# Patient Record
Sex: Male | Born: 1960 | Race: White | Hispanic: No | Marital: Married | Smoking: Former smoker
Health system: Southern US, Community
[De-identification: ages and names within clinical notes are randomized; demographics above are authoritative.]

## PROBLEM LIST (undated history)

## (undated) DIAGNOSIS — J309 Allergic rhinitis, unspecified: Secondary | ICD-10-CM

## (undated) DIAGNOSIS — I499 Cardiac arrhythmia, unspecified: Secondary | ICD-10-CM

## (undated) DIAGNOSIS — G4733 Obstructive sleep apnea (adult) (pediatric): Secondary | ICD-10-CM

## (undated) DIAGNOSIS — E119 Type 2 diabetes mellitus without complications: Secondary | ICD-10-CM

## (undated) DIAGNOSIS — H9319 Tinnitus, unspecified ear: Secondary | ICD-10-CM

## (undated) DIAGNOSIS — F32A Depression, unspecified: Secondary | ICD-10-CM

## (undated) DIAGNOSIS — G4730 Sleep apnea, unspecified: Secondary | ICD-10-CM

## (undated) DIAGNOSIS — G8929 Other chronic pain: Secondary | ICD-10-CM

## (undated) DIAGNOSIS — I480 Paroxysmal atrial fibrillation: Secondary | ICD-10-CM

## (undated) DIAGNOSIS — F419 Anxiety disorder, unspecified: Secondary | ICD-10-CM

## (undated) DIAGNOSIS — R519 Headache, unspecified: Secondary | ICD-10-CM

## (undated) DIAGNOSIS — J189 Pneumonia, unspecified organism: Secondary | ICD-10-CM

## (undated) DIAGNOSIS — R0600 Dyspnea, unspecified: Secondary | ICD-10-CM

## (undated) DIAGNOSIS — H8110 Benign paroxysmal vertigo, unspecified ear: Secondary | ICD-10-CM

## (undated) DIAGNOSIS — E538 Deficiency of other specified B group vitamins: Secondary | ICD-10-CM

## (undated) DIAGNOSIS — I10 Essential (primary) hypertension: Secondary | ICD-10-CM

## (undated) DIAGNOSIS — M722 Plantar fascial fibromatosis: Secondary | ICD-10-CM

## (undated) DIAGNOSIS — R0609 Other forms of dyspnea: Secondary | ICD-10-CM

## (undated) DIAGNOSIS — R51 Headache: Secondary | ICD-10-CM

## (undated) HISTORY — DX: Obstructive sleep apnea (adult) (pediatric): G47.33

## (undated) HISTORY — DX: Other forms of dyspnea: R06.09

## (undated) HISTORY — DX: Dyspnea, unspecified: R06.00

## (undated) HISTORY — DX: Sleep apnea, unspecified: G47.30

## (undated) HISTORY — PX: SHOULDER SURGERY: SHX246

## (undated) HISTORY — DX: Headache: R51

## (undated) HISTORY — DX: Paroxysmal atrial fibrillation: I48.0

## (undated) HISTORY — DX: Type 2 diabetes mellitus without complications: E11.9

## (undated) HISTORY — DX: Headache, unspecified: R51.9

## (undated) HISTORY — DX: Other chronic pain: G89.29

## (undated) HISTORY — DX: Essential (primary) hypertension: I10

---

## 2003-11-16 ENCOUNTER — Inpatient Hospital Stay: Payer: Self-pay

## 2003-11-16 ENCOUNTER — Other Ambulatory Visit: Payer: Self-pay

## 2003-11-17 ENCOUNTER — Other Ambulatory Visit: Payer: Self-pay

## 2003-11-28 ENCOUNTER — Ambulatory Visit: Payer: Self-pay | Admitting: Otolaryngology

## 2004-07-03 ENCOUNTER — Other Ambulatory Visit: Payer: Self-pay

## 2004-07-03 ENCOUNTER — Emergency Department: Payer: Self-pay | Admitting: Emergency Medicine

## 2004-08-05 ENCOUNTER — Emergency Department: Payer: Self-pay | Admitting: Emergency Medicine

## 2004-08-11 ENCOUNTER — Ambulatory Visit: Payer: Self-pay | Admitting: Unknown Physician Specialty

## 2004-08-13 ENCOUNTER — Ambulatory Visit: Payer: Self-pay | Admitting: Unknown Physician Specialty

## 2004-08-14 ENCOUNTER — Ambulatory Visit: Payer: Self-pay | Admitting: Unknown Physician Specialty

## 2004-08-18 ENCOUNTER — Ambulatory Visit: Payer: Self-pay | Admitting: Gastroenterology

## 2004-08-19 ENCOUNTER — Ambulatory Visit: Payer: Self-pay | Admitting: Gastroenterology

## 2005-04-27 ENCOUNTER — Ambulatory Visit (HOSPITAL_BASED_OUTPATIENT_CLINIC_OR_DEPARTMENT_OTHER): Admission: RE | Admit: 2005-04-27 | Discharge: 2005-04-27 | Payer: Self-pay | Admitting: Orthopaedic Surgery

## 2005-09-12 ENCOUNTER — Emergency Department: Payer: Self-pay | Admitting: Emergency Medicine

## 2005-09-12 ENCOUNTER — Other Ambulatory Visit: Payer: Self-pay

## 2005-12-27 ENCOUNTER — Encounter: Payer: Self-pay | Admitting: Cardiology

## 2006-04-15 ENCOUNTER — Emergency Department: Payer: Self-pay | Admitting: Emergency Medicine

## 2007-05-07 ENCOUNTER — Other Ambulatory Visit: Payer: Self-pay

## 2007-05-07 ENCOUNTER — Emergency Department: Payer: Self-pay | Admitting: Emergency Medicine

## 2007-05-08 ENCOUNTER — Inpatient Hospital Stay: Payer: Self-pay | Admitting: Internal Medicine

## 2007-05-08 ENCOUNTER — Other Ambulatory Visit: Payer: Self-pay

## 2007-05-09 ENCOUNTER — Encounter: Payer: Self-pay | Admitting: Cardiology

## 2007-05-22 ENCOUNTER — Encounter: Payer: Self-pay | Admitting: Cardiology

## 2007-10-23 ENCOUNTER — Encounter: Payer: Self-pay | Admitting: Cardiology

## 2007-10-23 LAB — CONVERTED CEMR LAB
Albumin: 3.9 g/dL
BUN: 15 mg/dL
CO2: 27.6 meq/L
Chloride: 103 meq/L
Cholesterol: 195 mg/dL
Creatinine, Ser: 0.9 mg/dL
GFR calc non Af Amer: >60 mL/min
Glomerular Filtration Rate, Af Am: >60 mL/min/{1.73_m2}
LDL Cholesterol: 84.2 mg/dL
Potassium: 4.6 meq/L
Sodium: 136.8 meq/L
TSH: 2.005 microintl units/mL
Total Bilirubin: 1 mg/dL
Total Protein: 6.8 g/dL

## 2008-01-16 ENCOUNTER — Encounter: Payer: Self-pay | Admitting: Cardiology

## 2008-07-27 DIAGNOSIS — I10 Essential (primary) hypertension: Secondary | ICD-10-CM

## 2008-07-27 DIAGNOSIS — I480 Paroxysmal atrial fibrillation: Secondary | ICD-10-CM

## 2008-07-27 DIAGNOSIS — R002 Palpitations: Secondary | ICD-10-CM

## 2008-08-27 ENCOUNTER — Encounter: Payer: Self-pay | Admitting: Cardiology

## 2008-08-31 ENCOUNTER — Encounter: Payer: Self-pay | Admitting: Cardiology

## 2008-09-02 ENCOUNTER — Ambulatory Visit: Payer: Self-pay | Admitting: Cardiology

## 2008-11-18 ENCOUNTER — Ambulatory Visit: Payer: Self-pay | Admitting: Psychiatry

## 2009-09-12 ENCOUNTER — Ambulatory Visit: Payer: Self-pay | Admitting: Cardiovascular Disease

## 2009-12-09 ENCOUNTER — Encounter: Payer: Self-pay | Admitting: Internal Medicine

## 2009-12-09 ENCOUNTER — Ambulatory Visit: Payer: Self-pay | Admitting: Internal Medicine

## 2009-12-09 ENCOUNTER — Encounter: Payer: Self-pay | Admitting: Cardiovascular Disease

## 2009-12-15 ENCOUNTER — Encounter: Payer: Self-pay | Admitting: Internal Medicine

## 2009-12-22 ENCOUNTER — Encounter: Payer: Self-pay | Admitting: Internal Medicine

## 2009-12-22 ENCOUNTER — Encounter: Payer: Self-pay | Admitting: Cardiovascular Disease

## 2009-12-23 ENCOUNTER — Encounter: Payer: Self-pay | Admitting: Internal Medicine

## 2009-12-23 ENCOUNTER — Ambulatory Visit: Payer: Self-pay | Admitting: Specialist

## 2010-01-05 ENCOUNTER — Ambulatory Visit: Payer: Self-pay | Admitting: Internal Medicine

## 2010-01-05 DIAGNOSIS — R0609 Other forms of dyspnea: Secondary | ICD-10-CM

## 2010-01-05 DIAGNOSIS — R0989 Other specified symptoms and signs involving the circulatory and respiratory systems: Secondary | ICD-10-CM

## 2010-01-06 ENCOUNTER — Telehealth: Payer: Self-pay | Admitting: Internal Medicine

## 2010-01-06 ENCOUNTER — Telehealth (INDEPENDENT_AMBULATORY_CARE_PROVIDER_SITE_OTHER): Payer: Self-pay | Admitting: *Deleted

## 2010-01-08 ENCOUNTER — Ambulatory Visit: Payer: Self-pay | Admitting: Internal Medicine

## 2010-01-13 ENCOUNTER — Encounter: Payer: Self-pay | Admitting: Internal Medicine

## 2010-01-13 ENCOUNTER — Telehealth: Payer: Self-pay | Admitting: Internal Medicine

## 2010-01-14 ENCOUNTER — Encounter: Payer: Self-pay | Admitting: Internal Medicine

## 2010-01-16 ENCOUNTER — Encounter: Payer: Self-pay | Admitting: Cardiovascular Disease

## 2010-01-16 ENCOUNTER — Ambulatory Visit
Admission: RE | Admit: 2010-01-16 | Discharge: 2010-01-16 | Payer: Self-pay | Source: Home / Self Care | Attending: Cardiovascular Disease | Admitting: Cardiovascular Disease

## 2010-01-16 DIAGNOSIS — R079 Chest pain, unspecified: Secondary | ICD-10-CM

## 2010-01-16 DIAGNOSIS — R0602 Shortness of breath: Secondary | ICD-10-CM

## 2010-01-16 DIAGNOSIS — R072 Precordial pain: Secondary | ICD-10-CM

## 2010-01-20 ENCOUNTER — Telehealth: Payer: Self-pay | Admitting: Cardiovascular Disease

## 2010-01-20 ENCOUNTER — Encounter: Payer: Self-pay | Admitting: Cardiovascular Disease

## 2010-01-20 ENCOUNTER — Ambulatory Visit: Admit: 2010-01-20 | Payer: Self-pay | Admitting: Cardiovascular Disease

## 2010-01-20 ENCOUNTER — Telehealth (INDEPENDENT_AMBULATORY_CARE_PROVIDER_SITE_OTHER): Payer: Self-pay | Admitting: *Deleted

## 2010-01-20 LAB — CONVERTED CEMR LAB
CO2: 24 meq/L (ref 19–32)
Chloride: 105 meq/L (ref 96–112)
Creatinine, Ser: 0.94 mg/dL (ref 0.40–1.50)
Glucose, Bld: 91 mg/dL (ref 70–99)

## 2010-01-21 ENCOUNTER — Encounter (HOSPITAL_COMMUNITY)
Admission: RE | Admit: 2010-01-21 | Discharge: 2010-02-17 | Payer: Self-pay | Source: Home / Self Care | Attending: Cardiovascular Disease | Admitting: Cardiovascular Disease

## 2010-01-21 ENCOUNTER — Encounter: Payer: Self-pay | Admitting: *Deleted

## 2010-01-21 ENCOUNTER — Other Ambulatory Visit: Payer: Self-pay | Admitting: Cardiovascular Disease

## 2010-01-21 ENCOUNTER — Ambulatory Visit: Admission: RE | Admit: 2010-01-21 | Discharge: 2010-01-21 | Payer: Self-pay | Source: Home / Self Care

## 2010-01-21 ENCOUNTER — Encounter: Payer: Self-pay | Admitting: Cardiology

## 2010-01-21 ENCOUNTER — Telehealth (INDEPENDENT_AMBULATORY_CARE_PROVIDER_SITE_OTHER): Payer: Self-pay | Admitting: *Deleted

## 2010-01-21 ENCOUNTER — Ambulatory Visit (HOSPITAL_COMMUNITY)
Admission: RE | Admit: 2010-01-21 | Discharge: 2010-01-21 | Payer: Self-pay | Source: Home / Self Care | Attending: Cardiovascular Disease | Admitting: Cardiovascular Disease

## 2010-01-22 ENCOUNTER — Telehealth: Payer: Self-pay | Admitting: Internal Medicine

## 2010-01-22 ENCOUNTER — Encounter: Payer: Self-pay | Admitting: Cardiovascular Disease

## 2010-01-22 ENCOUNTER — Encounter: Payer: Self-pay | Admitting: Internal Medicine

## 2010-01-23 ENCOUNTER — Ambulatory Visit: Admit: 2010-01-23 | Payer: Self-pay | Admitting: Cardiovascular Disease

## 2010-01-28 ENCOUNTER — Encounter: Payer: Self-pay | Admitting: Cardiovascular Disease

## 2010-01-28 ENCOUNTER — Ambulatory Visit (HOSPITAL_COMMUNITY)
Admission: RE | Admit: 2010-01-28 | Discharge: 2010-01-28 | Payer: Self-pay | Source: Home / Self Care | Attending: Cardiovascular Disease | Admitting: Cardiovascular Disease

## 2010-02-04 ENCOUNTER — Telehealth: Payer: Self-pay | Admitting: Internal Medicine

## 2010-02-06 ENCOUNTER — Ambulatory Visit
Admission: RE | Admit: 2010-02-06 | Discharge: 2010-02-06 | Payer: Self-pay | Source: Home / Self Care | Attending: Internal Medicine | Admitting: Internal Medicine

## 2010-02-06 ENCOUNTER — Encounter: Payer: Self-pay | Admitting: Internal Medicine

## 2010-02-09 ENCOUNTER — Telehealth: Payer: Self-pay | Admitting: Internal Medicine

## 2010-02-09 ENCOUNTER — Encounter: Payer: Self-pay | Admitting: Cardiovascular Disease

## 2010-02-11 ENCOUNTER — Telehealth: Payer: Self-pay | Admitting: Internal Medicine

## 2010-02-12 ENCOUNTER — Telehealth: Payer: Self-pay | Admitting: Cardiovascular Disease

## 2010-02-13 ENCOUNTER — Encounter: Payer: Self-pay | Admitting: Cardiovascular Disease

## 2010-02-13 ENCOUNTER — Encounter: Payer: Self-pay | Admitting: Internal Medicine

## 2010-02-13 ENCOUNTER — Ambulatory Visit
Admission: RE | Admit: 2010-02-13 | Discharge: 2010-02-13 | Payer: Self-pay | Source: Home / Self Care | Attending: Internal Medicine | Admitting: Internal Medicine

## 2010-02-13 ENCOUNTER — Ambulatory Visit
Admission: RE | Admit: 2010-02-13 | Discharge: 2010-02-13 | Payer: Self-pay | Source: Home / Self Care | Attending: Cardiology | Admitting: Cardiology

## 2010-02-16 ENCOUNTER — Encounter: Payer: Self-pay | Admitting: Cardiovascular Disease

## 2010-02-16 LAB — CONVERTED CEMR LAB
BUN: 14 mg/dL (ref 6–23)
Basophils Absolute: 0 10*3/uL (ref 0.0–0.1)
Basophils Relative: 1 % (ref 0–1)
CO2: 22 meq/L (ref 19–32)
Chloride: 104 meq/L (ref 96–112)
Creatinine, Ser: 1 mg/dL (ref 0.40–1.50)
Eosinophils Absolute: 0.2 10*3/uL (ref 0.0–0.7)
INR: 0.95 (ref <1.50)
MCHC: 35.6 g/dL (ref 30.0–36.0)
MCV: 95.7 fL (ref 78.0–100.0)
Monocytes Absolute: 0.2 10*3/uL (ref 0.1–1.0)
Monocytes Relative: 5 % (ref 3–12)
Neutrophils Relative %: 65 % (ref 43–77)
RBC: 4.64 M/uL (ref 4.22–5.81)
RDW: 12.7 % (ref 11.5–15.5)

## 2010-02-17 ENCOUNTER — Ambulatory Visit (HOSPITAL_BASED_OUTPATIENT_CLINIC_OR_DEPARTMENT_OTHER)
Admission: RE | Admit: 2010-02-17 | Discharge: 2010-02-17 | Disposition: A | Discharge status: Home / Self Care | Payer: BC Managed Care – PPO | Attending: Internal Medicine | Admitting: Internal Medicine

## 2010-02-17 ENCOUNTER — Encounter: Payer: Self-pay | Admitting: Internal Medicine

## 2010-02-17 ENCOUNTER — Telehealth (INDEPENDENT_AMBULATORY_CARE_PROVIDER_SITE_OTHER): Payer: Self-pay | Admitting: *Deleted

## 2010-02-17 DIAGNOSIS — E669 Obesity, unspecified: Secondary | ICD-10-CM | POA: Insufficient documentation

## 2010-02-17 DIAGNOSIS — R0789 Other chest pain: Secondary | ICD-10-CM | POA: Insufficient documentation

## 2010-02-17 DIAGNOSIS — Z79899 Other long term (current) drug therapy: Secondary | ICD-10-CM | POA: Insufficient documentation

## 2010-02-17 DIAGNOSIS — R0609 Other forms of dyspnea: Secondary | ICD-10-CM | POA: Insufficient documentation

## 2010-02-17 DIAGNOSIS — I10 Essential (primary) hypertension: Secondary | ICD-10-CM | POA: Insufficient documentation

## 2010-02-17 DIAGNOSIS — R0989 Other specified symptoms and signs involving the circulatory and respiratory systems: Secondary | ICD-10-CM | POA: Insufficient documentation

## 2010-02-17 LAB — POCT I-STAT 3, VENOUS BLOOD GAS (G3P V)
Acid-base deficit: 4 mmol/L — ABNORMAL HIGH (ref 0.0–2.0)
Bicarbonate: 23.1 mEq/L (ref 20.0–24.0)
Bicarbonate: 23.7 mEq/L (ref 20.0–24.0)
O2 Saturation: 67 %
TCO2: 25 mmol/L (ref 0–100)
pCO2, Ven: 45.1 mmHg (ref 45.0–50.0)
pCO2, Ven: 47.9 mmHg (ref 45.0–50.0)
pH, Ven: 7.292 (ref 7.250–7.300)
pH, Ven: 7.328 — ABNORMAL HIGH (ref 7.250–7.300)
pO2, Ven: 39 mmHg (ref 30.0–45.0)
pO2, Ven: 40 mmHg (ref 30.0–45.0)

## 2010-02-17 LAB — POCT I-STAT 3, ART BLOOD GAS (G3+)
Acid-base deficit: 1 mmol/L (ref 0.0–2.0)
Bicarbonate: 25.2 mEq/L — ABNORMAL HIGH (ref 20.0–24.0)
O2 Saturation: 97 %
TCO2: 27 mmol/L (ref 0–100)
pCO2 arterial: 46.5 mmHg — ABNORMAL HIGH (ref 35.0–45.0)
pH, Arterial: 7.341 — ABNORMAL LOW (ref 7.350–7.450)
pO2, Arterial: 92 mmHg (ref 80.0–100.0)

## 2010-02-17 NOTE — Assessment & Plan Note (Signed)
Summary: EC6/AMD   Visit Type:  Follow-up Referring Provider:  Lenise Herald MD Primary Provider:  Marlou Starks MD  CC:  Denies chest pain or shortness of breath..  History of Present Illness:  50year-old married white male with a history of isolated atrial fibrillation when he was 50 years of age. his last episode was at age 35. history of hypertension.   overall, he is been feeling well. He denies any recent episodes of atrial fibrillation. He may have had a very brief episode one year ago lasting for several seconds. He is active, has been trying to work on his weight though his weight has been increasing. He sleep well with no significant sleep apnea symptoms. He reports a previous sleep study which was normal. He has had nasal septal surgery. He has back pain and cannot sleep on his stomach.  His last echocardiogram was in April 2009. He had an EF of 55%, mild left atrial enlargement, otherwise unremarkable. A Holter monitor did not show any atrial fib.  EKG shows normal sinus rhythm with rate of 60 beats per minute, no significant ST or T wave changes.  Current Medications (verified): 1)  Flomax 0.4 Mg Caps (Tamsulosin Hcl) .Marland Kitchen.. 1 By Mouth Once Daily 2)  Bystolic 5 Mg Tabs (Nebivolol Hcl) .Marland Kitchen.. 1 By Mouth Once Daily 3)  Aspirin Ec 325 Mg Tbec (Aspirin) .... Take One Tablet By Mouth Daily 4)  Venlafaxine Hcl 75 Mg Tabs (Venlafaxine Hcl) .... One Tablet Once Daily  Allergies (verified): No Known Drug Allergies  Past History:  Past Medical History: Last updated: 08/31/2008 Atrial Fibrillation with rapid ventricular response Hypertension Intermittent tachypalpitations Borderline hyperlipidemia  Past Surgical History: Last updated: 09/02/2008 2X RIGHT SHOULDER  Family History: Last updated: 08/31/2008 No family history of coronary artery disease  Social History: Last updated: 09/02/2008 He is married. He has 4 dgts. He drinks wine on and off twice a week. Full  Time Tobacco Use - Former.  Regular Exercise - no  Risk Factors: Alcohol Use: 1/2 WEEKEND (09/02/2008) Caffeine Use: 4/5 CUPS OF COFFEE (09/02/2008) Exercise: no (09/02/2008)  Risk Factors: Smoking Status: quit (09/02/2008) Packs/Day: 1.5 (09/02/2008)  Review of Systems       The patient complains of weight gain.  The patient denies fever, weight loss, vision loss, decreased hearing, hoarseness, chest pain, syncope, dyspnea on exertion, peripheral edema, prolonged cough, abdominal pain, incontinence, muscle weakness, depression, and enlarged lymph nodes.    Vital Signs:  Patient profile:   50 year old male Height:      72 inches Weight:      266 pounds BMI:     36.21 Pulse rate:   54 / minute BP sitting:   128 / 76  (right arm) Cuff size:   large  Vitals Entered By: Bishop Dublin, CMA (September 12, 2009 10:36 AM)  Physical Exam  General:  Well developed, well nourished, in no acute distress. Head:  normocephalic and atraumatic Neck:  Neck supple, no JVD. No masses, thyromegaly or abnormal cervical nodes. Lungs:  Clear bilaterally to auscultation and percussion. Heart:  Non-displaced PMI, chest non-tender; regular rate and rhythm, S1, S2 without murmurs, rubs or gallops. Carotid upstroke normal, no bruit. Pedals normal pulses. No edema, no varicosities. Abdomen:   abdomen soft and non-tender without masses Msk:  Back normal, normal gait. Muscle strength and tone normal. Pulses:  pulses normal in all 4 extremities Extremities:  No clubbing or cyanosis. Neurologic:  Alert and oriented x 3. Skin:  Intact  without lesions or rashes. Psych:  Normal affect.   Impression & Recommendations:  Problem # 1:  ATRIAL FIBRILLATION (ICD-427.31) no recent episodes of atrial fibrillation. We will continue him on his current medication regimen.  His updated medication list for this problem includes:    Bystolic 5 Mg Tabs (Nebivolol hcl) .Marland Kitchen... 1 by mouth once daily    Aspirin Ec 325 Mg  Tbec (Aspirin) .Marland Kitchen... Take one tablet by mouth daily  Orders: EKG w/ Interpretation (93000)  Problem # 2:  HYPERTENSION, BENIGN (ICD-401.1) Blood pressure is reasonably well controlled. No changes to his medicines.  His updated medication list for this problem includes:    Bystolic 5 Mg Tabs (Nebivolol hcl) .Marland Kitchen... 1 by mouth once daily    Aspirin Ec 325 Mg Tbec (Aspirin) .Marland Kitchen... Take one tablet by mouth daily  Patient Instructions: 1)  Your physician recommends that you continue on your current medications as directed. Please refer to the Current Medication list given to you today. 2)  Your physician wants you to follow-up in:   1 year You will receive a reminder letter in the mail two months in advance. If you don't receive a letter, please call our office to schedule the follow-up appointment.

## 2010-02-18 ENCOUNTER — Telehealth: Payer: Self-pay | Admitting: Internal Medicine

## 2010-02-19 NOTE — Progress Notes (Signed)
Summary: Rx needed?   Phone Note Call from Patient   Summary of Call: Pt c/o sinus pressure/pain and green drainage, non productive cough. Mucinex DM has not helped. No fever, body aches or chills. Patient is requesting advisement.  Initial call taken by: Lamar Sprinkles, CMA,  February 04, 2010 10:52 AM  Follow-up for Phone Call        1. Sudafed 30 mg three times a day, continue mucinex DM 2.Z-pak as directed.  3. Did he make it to the cardio-pulmonary stress test last week - no report yet in EMR Follow-up by: Jacques Navy MD,  February 04, 2010 2:13 PM  Additional Follow-up for Phone Call Additional follow up Details #1::        Pt informed, Yes he made it to test and they advised results would not be avail until the 20th at the earliest. Pt had test at the hospital.  Additional Follow-up by: Lamar Sprinkles, CMA,  February 04, 2010 3:47 PM    New/Updated Medications: ZITHROMAX Z-PAK 250 MG TABS (AZITHROMYCIN) as directed Prescriptions: ZITHROMAX Z-PAK 250 MG TABS (AZITHROMYCIN) as directed  #1 x 0   Entered by:   Lamar Sprinkles, CMA   Authorized by:   Jacques Navy MD   Signed by:   Lamar Sprinkles, CMA on 02/04/2010   Method used:   Electronically to        [facility] (retail)       [address], [address]       [place], [place]  [address]       Ph: [phone]       Fax: [phone]   RxID:   [id number]

## 2010-02-19 NOTE — Assessment & Plan Note (Signed)
Summary: new pt/bcbs/#/lb   Vital Signs:  Patient profile:   50 year old male Height:      72 inches Weight:      278.50 pounds BMI:     37.91 O2 Sat:      96 % on Room air Temp:     98.2 degrees F oral Pulse rate:   53 / minute BP sitting:   122 / 74  (left arm) Cuff size:   large  Vitals Entered By: Rock Nephew CMA (January 05, 2010 12:56 PM)  O2 Flow:  Room air CC: New to establish// pt c/o SOB and chest pressure since 11/2009 Is Patient Diabetic? No Pain Assessment Patient in pain? no       Does patient need assistance? Functional Status Self care Ambulation Normal   Primary Care Crecencio Kwiatek:  Marlou Starks MD  CC:  New to establish// pt c/o SOB and chest pressure since 11/2009.  History of Present Illness: Patient is seen acutely for persisten illness.   He has been sick since November 11th. He was seen at Urgent Care - diagnosed with flu, treated with a Z-pak and prednisone. Didn't get better. Went back to Urgent Care - question of pneumonia LLB based on chest x-ray and was treated with Levaquin and more prednisone. Didn't get better. Went to Dr. Mayo Ao, pulmonology at [facility]. CT scan revealed a nodule at the left main main bronchus which lead to FOB - revealing inflammation but no malignancy with negative BAL. No additional treatment prescribed as of time of last contact approximately  December 10th.   He continues to feel weak, SOB, DOE. He gets a feeling of pressure on/in his chest. Still with a non-productive cough. Doesn't feel right. Had lab work, cannot recall what, but it was all negative. He did have an elevated white count initially but on repeat CBC whie count was normal. He does not recall any other positive labs. He has not had PFTs. He did return to Dr. sparks, his PCP, who started him empirically on symbicort.   He has h/o atrial fibrillation with hospitalization x 3: onset at age 83. Sees Dr. Mariah Milling, [facility]. He had been seen by Dr.  Jenne Campus and the Dr. Daleen Squibb. He has never been deemed a candidate for coumadin or other anticoagulant except for aspirin. He has not been diagnosed with CAD and has had no testing for this.   Current Medications (verified): 1)  Flomax 0.4 Mg Caps (Tamsulosin Hcl) .Marland Kitchen.. 1 By Mouth Once Daily 2)  Bystolic 5 Mg Tabs (Nebivolol Hcl) .Marland Kitchen.. 1 By Mouth Once Daily 3)  Aspirin Ec 325 Mg Tbec (Aspirin) .... Take One Tablet By Mouth Daily 4)  Venlafaxine Hcl 75 Mg Tabs (Venlafaxine Hcl) .... One Tablet Once Daily  Allergies (verified): No Known Drug Allergies  Past History:  Past Medical History: Last updated: 08/31/2008 Atrial Fibrillation with rapid ventricular response Hypertension Intermittent tachypalpitations Borderline hyperlipidemia  Past Surgical History: Last updated: 09/02/2008 2X RIGHT SHOULDER  Family History: Father - deceased @ 66: Cancer with mets; emphysema Mother - 48: good health, on no meds No family history of coronary artery disease; colon or prostate cancer; no DM  Social History: [employer] Married - '82 - 17 yrs/divorced; married '06.  He has 4 daughters. Work: truck Hospital doctor - long haul.   He drinks wine on and off twice a week. Tobacco Use - Former.  Regular Exercise - no  Review of Systems       The patient complains  of weight gain, hoarseness, chest pain, syncope, dyspnea on exertion, prolonged cough, muscle weakness, and depression.  The patient denies anorexia, fever, weight loss, decreased hearing, peripheral edema, headaches, hemoptysis, abdominal pain, melena, hematochezia, severe indigestion/heartburn, suspicious skin lesions, transient blindness, difficulty walking, unusual weight change, and angioedema.         chest pressure - like suffocating. Has had near syncope with position change. Dry, non-productive cough. Depression well controlled with Effexor.  Physical Exam  General:  WNWD muscular white male in no acute distress Head:  normocephalic and  atraumatic.   Eyes:  pupils equal and pupils round.   Ears:  External ear exam shows no significant lesions or deformities.  Otoscopic examination reveals clear canals, tympanic membranes are intact bilaterally without bulging, retraction, inflammation or discharge. Hearing is grossly normal bilaterally. Mouth:  Oral mucosa and oropharynx without lesions or exudates.  Teeth in good repair. Neck:  supple, full ROM, no thyromegaly, and no carotid bruits.   Chest Wall:  No deformities, masses, tenderness or gynecomastia noted. Increased AP diameter Lungs:  normal respiratory effort, no intercostal retractions, no accessory muscle use, normal breath sounds, no fremitus, no crackles, and no wheezes.   Heart:  normal rate, regular rhythm, no murmur, and no HJR.   Abdomen:  soft, non-tender, and normal bowel sounds.   Msk:  normal ROM, no joint tenderness, no joint swelling, and no joint instability.   Pulses:  radial 2+ bilaterally Neurologic:  alert & oriented X3, cranial nerves II-XII intact, strength normal in all extremities, and gait normal.   Skin:  turgor normal, color normal, no rashes, and no ulcerations.   Cervical Nodes:  no anterior cervical adenopathy and no posterior cervical adenopathy.   Psych:  Oriented X3 and memory intact for recent and remote.     Impression & Recommendations:  Problem # 1:  DYSPNEA ON EXERTION (ICD-786.09) Complex history that all began with a probable infectious episode. Work-up to date has not detrmined a cause for his persistent symptoms of DOE, decreased energy and stamina and persistent cough. Exam is unremarkable. Considerations include chronic infectious disease, i.e. EBV, mycoplasma, other viral agents, zooinosis; sarcoidosis or other auto-immune disease; cardiac disease.  Plan - obtain and review all labs and imaging reports pertaining to this illness           pre and post bronchodilator pulmonary function studies.            additional studies/labs  following review           treatment for cyclical or irritative cough: promethazine/codeine cough syrup; benzonatate 100mg  three times a day; nexium 40mg  qAM           Consider for cardiac stress testing  Follow- up visit after PFTs            His updated medication list for this problem includes:    Bystolic 5 Mg Tabs (Nebivolol hcl) .Marland Kitchen... 1 by mouth once daily  Orders: Pulmonary Referral (Pulmonary)  Problem # 2:  HYPERTENSION, BENIGN (ICD-401.1)  His updated medication list for this problem includes:    Bystolic 5 Mg Tabs (Nebivolol hcl) .Marland Kitchen... 1 by mouth once daily  BP today: 122/74 Prior BP: 128/76 (09/12/2009)  Good control  Complete Medication List: 1)  Flomax 0.4 Mg Caps (Tamsulosin hcl) .Marland Kitchen.. 1 by mouth once daily 2)  Bystolic 5 Mg Tabs (Nebivolol hcl) .Marland Kitchen.. 1 by mouth once daily 3)  Aspirin Ec 325 Mg Tbec (Aspirin) .... Take one tablet by  mouth daily 4)  Venlafaxine Hcl 75 Mg Tabs (Venlafaxine hcl) .... One tablet once daily 5)  Promethazine-codeine 6.25-10 Mg/17ml Syrp (Promethazine-codeine) .Marland Kitchen.. 1 tsp q 6 for cough 6)  Benzonatate 100 Mg Caps (Benzonatate) .Marland Kitchen.. 1 by mouth three times a day for cough 7)  Nexium 40 Mg Cpdr (Esomeprazole magnesium) .Marland Kitchen.. 1 by mouth q am for cough/reflux  Patient Instructions: 1)  pulmonary - no definite diagnosis despite work up todate. Plan - full pulmonary function studies (at [facility]); will review all labs and imaging reports. For cough: promethazine with codein 1 tsp (or less) every 6 hours as needed; benzonatate perles 100mg  three times a day; nexium 40mg  by mouth before breakfast daily. Symbicort is optional at this time: if it doesn't help don't use it.  2)  Cardiac - normal exam. Symptoms do raise the concern for possible coronary disease, i.e. heaviness in the chest, decreased exercise tolerance and shortness of breath with exertin. Plan - will notify Dr. Mariah Milling of my concerns and defer to him.  Prescriptions: NEXIUM 40 MG CPDR  (ESOMEPRAZOLE MAGNESIUM) 1 by mouth q Am for cough/reflux  #30 x 5   Entered and Authorized by:   Jacques Navy MD   Signed by:   Jacques Navy MD on 01/05/2010   Method used:   Print then Give to Patient   RxID:   [id number] BENZONATATE 100 MG CAPS (BENZONATATE) 1 by mouth three times a day for cough  #30 x 5   Entered and Authorized by:   Jacques Navy MD   Signed by:   Jacques Navy MD on 01/05/2010   Method used:   Print then Give to Patient   RxID:   [id number] PROMETHAZINE-CODEINE 6.25-10 MG/5ML SYRP (PROMETHAZINE-CODEINE) 1 tsp q 6 for cough  #8 oz x 1   Entered and Authorized by:   Jacques Navy MD   Signed by:   Jacques Navy MD on 01/05/2010   Method used:   Print then Give to Patient   RxID:   [id number]    Orders Added: 1)  Pulmonary Referral [Pulmonary] 2)  New Patient Level IV [01027]

## 2010-02-19 NOTE — Letter (Signed)
Summary: Work Writer at [employer]. [address]   [place], [place] [address]   Phone: [phone]  Fax: [phone]     February 09, 2010    Collin Holt   The above named patient will need to be excused from work for medical reasons until further testing is completed for 2 weeks from today 02/09/10.  Please take this into consideration when reviewing the time away from work. Feel free to give our office a call for any further questions or concerns at [phone].      Sincerely yours,        Dossie Arbour, MD

## 2010-02-19 NOTE — Letter (Signed)
Summary: Work Writer at [employer]. [address]   [place], [place] [address]   Phone: [phone]  Fax: [phone]     January 22, 2010    Brodyn Hornak    Please exuse patient from work for medical reasons from now through February 10, 2010. If you have any questions please call me at [phone].    Sincerely yours,    Dr. Dossie Arbour [facility]

## 2010-02-19 NOTE — Progress Notes (Signed)
Summary: ALT MED NEEDED?   Phone Note Call from Patient   Summary of Call: Pt was given zpak w/no change in symptoms (see phone note from 1/18).  Initial call taken by: Lamar Sprinkles, CMA,  February 11, 2010 1:35 PM  Follow-up for Phone Call        Augmentin 875mg  two times a day x 7. If no improvement will need ov.  Follow-up by: Jacques Navy MD,  February 11, 2010 5:30 PM  Additional Follow-up for Phone Call Additional follow up Details #1::        Pt informed  Additional Follow-up by: Lamar Sprinkles, CMA,  February 11, 2010 6:32 PM    New/Updated Medications: AUGMENTIN 875-125 MG TABS (AMOXICILLIN-POT CLAVULANATE) 1 two times a day x 7 Prescriptions: AUGMENTIN 875-125 MG TABS (AMOXICILLIN-POT CLAVULANATE) 1 two times a day x 7  #14 x 0   Entered by:   Lamar Sprinkles, CMA   Authorized by:   Jacques Navy MD   Signed by:   Lamar Sprinkles, CMA on 02/11/2010   Method used:   Electronically to        [facility] (retail)       [address], [address]       [place], [place]  [address]       Ph: [phone]       Fax: [phone]   RxID:   [id number]

## 2010-02-19 NOTE — Assessment & Plan Note (Signed)
Summary: F/U post CPX   Visit Type:  Follow-up Referring Provider:  Lenise Herald MD Primary Provider:  Dr. Judithann Holt  CC:  c/o SOB and cold. Denies chest pain and palpitations..  History of Present Illness: Collin Holt is a 50 year old married white male with a history of isolated atrial fibrillation when he was 50 years of age. his last episode was at age 49,  hypertension, obesity, referred by Collin Holt for further evaluation of dyspnea.  Dyspnea started in November. Initially it was felt that this was due to a viral URI and bronchitis. He was treated with antibiotics. Since then his shortness of breath has persisted and become worse. His wife reports that he is unable to do anything, including walking short distances without significant shortness of breath. He is at significant workup including bronchoscopy, pulmonary function tests, CT scan of the chest on other things which have been unrevealing.   His last echocardiogram was in April 2009. He had an EF of 55%, mild left atrial enlargement, otherwise unremarkable. A Holter monitor did not show any atrial fib.  Saw Collin Holt a couple weeks ago and arranged for CPX test.   Had CPX 01/28/10: pVO2 26.4 (101% predicted)  - corrects to 39.3 for ideal weight, RER 1.12 Slope 28.2 Ve/MVV 84% O2-pulse 100%  Resting spirometry normal.   Since that time had 4 days where he felt better but then got another URI and now feels SOB again. Also having tachypalpitations again 1-2 times per day. Last 15-20 minutes. Had sleep study 5 years ago which was normal. None since. Wife says he snores and stops breathing. No CP. No edema.  Drinking several beers and several glasses of wine per night.    Problems Prior to Update: 1)  Chest Pain-unspecified  (ICD-786.50) 2)  Chest Pain-precordial  (ICD-786.51) 3)  Shortness of Breath  (ICD-786.05) 4)  Dyspnea On Exertion  (ICD-786.09) 5)  Atrial Fibrillation  (ICD-427.31) 6)  Hypertension, Benign  (ICD-401.1) 7)   Palpitations  (ICD-785.1)  Medications Prior to Update: 1)  Flomax 0.4 Mg Caps (Tamsulosin Hcl) .Marland Kitchen.. 1 By Mouth Once Daily 2)  Aspirin Ec 325 Mg Tbec (Aspirin) .... Take One Tablet By Mouth Daily 3)  Venlafaxine Hcl 75 Mg Tabs (Venlafaxine Hcl) .... One Tablet Once Daily 4)  Promethazine-Codeine 6.25-10 Mg/32ml Syrp (Promethazine-Codeine) .Marland Kitchen.. 1 Tsp Q 6 For Cough 5)  Furosemide 20 Mg Tabs (Furosemide) .... Take One Tablet By Mouth Two Times A Day. 6)  Zithromax Z-Pak 250 Mg Tabs (Azithromycin) .... As Directed  Current Medications (verified): 1)  Flomax 0.4 Mg Caps (Tamsulosin Hcl) .Marland Kitchen.. 1 By Mouth Once Daily 2)  Aspirin Ec 325 Mg Tbec (Aspirin) .... Take One Tablet By Mouth Daily 3)  Venlafaxine Hcl 75 Mg Tabs (Venlafaxine Hcl) .... One Tablet Once Daily 4)  Zithromax Z-Pak 250 Mg Tabs (Azithromycin) .... As Directed 5)  Sudafed 30 Mg Tabs (Pseudoephedrine Hcl) .Marland Kitchen.. 1 Tablet Three Times A Day 6)  Mucinex Dm 30-600 Mg Xr12h-Tab (Dextromethorphan-Guaifenesin) .Marland Kitchen.. 1 Tablet Daily  Allergies (verified): No Known Drug Allergies  Past History:  Past Medical History: Last updated: 08/31/2008 Atrial Fibrillation with rapid ventricular response Hypertension Intermittent tachypalpitations Borderline hyperlipidemia  Past Surgical History: Last updated: 09/02/2008 2X RIGHT SHOULDER  Family History: Last updated: 01/20/2010 Father - deceased @ 58: Cancer with mets; emphysema Mother - 1922: good health, on no meds No family history of coronary artery disease; colon or prostate cancer; no DM  Social History: Last updated: 2010-01-20 HSG Married - '  82 - 17 yrs/divorced; married '06.  He has 4 daughters. Work: truck Hospital doctor - long haul.   He drinks wine on and off twice a week. Tobacco Use - Former.  Regular Exercise - no  Risk Factors: Alcohol Use: 1/2 WEEKEND (09/02/2008) Caffeine Use: 4/5 CUPS OF COFFEE (09/02/2008) Exercise: no (09/02/2008)  Risk Factors: Smoking Status: quit  (09/02/2008) Packs/Day: 1.5 (09/02/2008)  Family History: Reviewed history from 01/05/2010 and no changes required. Father - deceased @ 65: Cancer with mets; emphysema Mother - 74: good health, on no meds No family history of coronary artery disease; colon or prostate cancer; no DM  Social History: Reviewed history from 01/05/2010 and no changes required. HSG Married - '82 - 17 yrs/divorced; married '06.  He has 4 daughters. Work: truck Hospital doctor - long haul.   He drinks wine on and off twice a week. Tobacco Use - Former.  Regular Exercise - no  Review of Systems       As per HPI and past medical history; otherwise all systems negative.   Vital Signs:  Patient profile:   50 year old male Height:      72 inches Weight:      279.25 pounds BMI:     38.01 Pulse rate:   99 / minute BP sitting:   122 / 72  (left arm) Cuff size:   large  Vitals Entered By: Collin Holt CMA (February 06, 2010 2:07 PM)  Physical Exam  General:  overweight muscular white male in no acute distress HEENT: normal Neck: thick  supple. no JVD. Carotids 2+ bilat; no bruits. No lymphadenopathy or thryomegaly appreciated. Cor: PMI nondisplaced. Regular rate & rhythm. No rubs, gallops, murmur. Lungs: clear Abdomen: obese soft, nontender, nondistended.  Extremities: no cyanosis, clubbing, rash, edema Neuro: alert & orientedx3, cranial nerves grossly intact. moves all 4 extremities w/o difficulty. affect pleasant    Impression & Recommendations:  Problem # 1:  DYSPNEA ON EXERTION (ICD-786.09) Had a long talk with him and his wife about results of CPX. This shows excellent functional capacity with no cardiac limitation. His limiting factors appear to be his weight and related ventilatory limitation. I also suspect he has signifcant sleep apnea. Suggested serious weight loss program and sleep study. We did discuss possibility of heart cath but I told him I did not feel there was any indication for this  currently based on his CPX.   Problem # 2:  PALPITATIONS (ICD-785.1) Will place monitor. Discussed the role sleep apnea and ETOH intake can have with atrial arrhythmias. Will arrange sleep study and also counseled him on need to limit ETOH intake.   Other Orders: EKG w/ Interpretation (93000)  Patient Instructions: 1)  Your physician recommends that you schedule a follow-up appointment in: 1 month 2)  Your physician recommends that you continue on your current medications as directed. Please refer to the Current Medication list given to you today. 3)  Your physician has recommended that you wear an event monitor for 2 weeks.  We will order this and it will be sent to your home. Event monitors are medical devices that record the heart's electrical activity. Doctors most often use these monitors to diagnose arrhythmias. Arrhythmias are problems with the speed or rhythm of the heartbeat. The monitor is a small, portable device. You can wear one while you do your normal daily activities. This is usually used to diagnose what is causing palpitations/syncope (passing out). 4)  Your physician has recommended that you have a  sleep study.  This test records several body functions during sleep, including:  brain activity, eye movement, oxygen and carbon dioxide blood levels, heart rate and rhythm, breathing rate and rhythm, the flow of air through your mouth and nose, snoring, body muscle movements, and chest and belly movement. 5)  Your consult with Collin Holt for sleep study is scheduled for Wednesday, March 04, 2010 @ 3:30, please arrive at 3:15.

## 2010-02-19 NOTE — Progress Notes (Signed)
Summary: Out wk note  Phone Note Call from Patient   Summary of Call: Needs wk note from today from to next Tuesday. OK?  Initial call taken by: Lamar Sprinkles, CMA,  January 13, 2010 2:40 PM  Follow-up for Phone Call        OK for work note as requested. Follow-up by: Jacques Navy MD,  January 13, 2010 6:02 PM

## 2010-02-19 NOTE — Progress Notes (Signed)
Summary: SOB  Phone Note Call from Patient Call back at Home Phone [phone]   Caller: Spouse-[name] Call For: Nurse Summary of Call: Would like to schedule procedures sooner. This weekend Mr.Grieger didn't feel any better, per Eunice Blase he has felt worse. Also pt would like to have a note for work because he doesn't feel like he can go until all tests are scheduled. Initial call taken by: Lysbeth Galas CMA,  February 09, 2010 8:26 AM  Follow-up for Phone Call        Spoke to pt's wife, pt has had incr SOB over weekend that occurs at rest at random throughout the day. Pt is scheduled for consult with Dr. Craige Cotta for sleep consult for 03/04/10 which is the first available appt. Pt is concerned to wait this long due to current symptoms. Pt has 2 week event monitor ordered that should arrive to pt's home in 48 hours. Notified pt he could come in today for nurse visit for EKG and review of symptoms. Pt states he is not currently SOB but will call if this occurs and he may come in. Do you recommend anything different in the meantime? Pt stated you mentioned possibility of heart cath, he is open for anything at this time. Please advise. Follow-up by: Lanny Hurst RN,  February 09, 2010 9:29 AM  Additional Follow-up for Phone Call Additional follow up Details #1::        If symptoms worse we can consider heart cath though my suspicion is that this is not cardiac in nature. However, he does have risk factors and if he would like to proceed with cath I do not think that this is unreasonable appraoch. Dolores Patty, MD, [facility]  February 10, 2010 4:59 PM      Appended Document: SOB Spoke to pt's wife this am. Pt is having palpitations after minimal exertion. Notified her of above recommendation. Also told pt he could come for nurse visit for EKG. Event monitor to arrive this week. Pt's wife will talk to him and call us back to let us know what he wants to do.

## 2010-02-19 NOTE — Assessment & Plan Note (Signed)
Summary: SOB, CHEST TIGHTNESS/SAB   Visit Type:  Establish Care Primary Provider:  Dr. Judithann Sheen  CC:  c/o SOB, chest tightness, and disoriented at times and A. Fib for the first time yesterday.Collin Holt  History of Present Illness:  50year-old married white male with a history of isolated atrial fibrillation when he was 50 years of age. his last episode was at age 50,  hypertension, obesity, presents with worsening SOB and chest tightness.  He indicates that his shortness of breath started in November. Initially it was felt that this was due to a viral URI and bronchitis. He was treated with antibiotics. Since then his shortness of breath has persisted and become worse. His wife reports that he is unable to do anything, including walking short distances without significant shortness of breath. He is at significant workup including bronchoscopy, pulmonary function tests, CT scan of the chest on other things which have been unrevealing.  His wife also reports that in addition to the shortness of breath, he has had episodes of nausea, dizziness, and appears pale when these episodes present. He denies any significant recent arrhythmia apart from a possible episode lasting 3-4 seconds.  When asked if he could perform on a treadmill, both he and his wife said that he would have significant difficulty walking any distance because of his shortness of breath.   His last echocardiogram was in April 2009. He had an EF of 55%, mild left atrial enlargement, otherwise unremarkable. A Holter monitor did not show any atrial fib.  EKG shows normal sinus rhythm with rate of 60 beats per minute, no significant ST or T wave changes.  Current Medications (verified): 1)  Flomax 0.4 Mg Caps (Tamsulosin Hcl) .Collin Holt.. 1 By Mouth Once Daily 2)  Bystolic 5 Mg Tabs (Nebivolol Hcl) .Collin Holt.. 1 By Mouth Once Daily 3)  Aspirin Ec 325 Mg Tbec (Aspirin) .... Take One Tablet By Mouth Daily 4)  Venlafaxine Hcl 75 Mg Tabs (Venlafaxine Hcl) ....  One Tablet Once Daily 5)  Promethazine-Codeine 6.25-10 Mg/48ml Syrp (Promethazine-Codeine) .Collin Holt.. 1 Tsp Q 6 For Cough  Allergies (verified): No Known Drug Allergies  Past History:  Past Medical History: Last updated: 08/31/2008 Atrial Fibrillation with rapid ventricular response Hypertension Intermittent tachypalpitations Borderline hyperlipidemia  Past Surgical History: Last updated: 09/02/2008 2X RIGHT SHOULDER  Family History: Last updated: 01/19/2010 Father - deceased @ 41: Cancer with mets; emphysema Mother - 1922: good health, on no meds No family history of coronary artery disease; colon or prostate cancer; no DM  Social History: Last updated: 01/19/2010 HSG Married - '82 - 17 yrs/divorced; married '06.  He has 4 daughters. Work: truck Hospital doctor - long haul.   He drinks wine on and off twice a week. Tobacco Use - Former.  Regular Exercise - no  Risk Factors: Alcohol Use: 1/2 WEEKEND (09/02/2008) Caffeine Use: 4/5 CUPS OF COFFEE (09/02/2008) Exercise: no (09/02/2008)  Risk Factors: Smoking Status: quit (09/02/2008) Packs/Day: 1.5 (09/02/2008)  Review of Systems       The patient complains of weight gain, chest pain, and dyspnea on exertion.  The patient denies fever, weight loss, vision loss, decreased hearing, hoarseness, syncope, peripheral edema, prolonged cough, abdominal pain, incontinence, muscle weakness, depression, and enlarged lymph nodes.    Vital Signs:  Patient profile:   50 year old male Height:      72 inches Weight:      278.50 pounds BMI:     37.91 Pulse rate:   57 / minute BP sitting:  132 / 72  (left arm) Cuff size:   large  Vitals Entered By: Lysbeth Galas CMA (January 16, 2010 3:34 PM)  Physical Exam  General:  WNWD muscular white male in no acute distress Head:  normocephalic and atraumatic Neck:  Neck supple, no JVD. No masses, thyromegaly or abnormal cervical nodes. Lungs:  Clear bilaterally to auscultation and  percussion. Heart:  Non-displaced PMI, chest non-tender; regular rate and rhythm, S1, S2 without murmurs, rubs or gallops. Carotid upstroke normal, no bruit.  Pedals normal pulses. No edema, no varicosities. Abdomen:  Bowel sounds positive; abdomen soft and non-tender without masses Msk:  Back normal, normal gait. Muscle strength and tone normal. Pulses:  pulses normal in all 4 extremities Extremities:  No clubbing or cyanosis. Neurologic:  Alert and oriented x 3. Skin:  Intact without lesions or rashes. Psych:  Normal affect.   Impression & Recommendations:  Problem # 1:  CHEST PAIN-UNSPECIFIED (ICD-786.50) etiology of his chest pain is uncertain. Chest pain and shortness of breath seemed to present together and have become much worse over the past month. He is unable to treadmill because of his significant shortness of breath. We have placed an order for an echocardiogram and likely scan Myoview ( given that he is unable to treadmill). Pulmonary workup has been unrevealing. We will hold his beta blocker for now to make sure that this is not contributing to his symptoms. Also check a basic metabolic panel and BNP. We have given him a prescription for Lasix 20 mg b.i.d. p.r.n. if his symptoms do get worse. If BNP is normal this would likely be of minimal benefit.  The following medications were removed from the medication list:    Bystolic 5 Mg Tabs (Nebivolol hcl) .Collin Holt... 1 by mouth once daily His updated medication list for this problem includes:    Aspirin Ec 325 Mg Tbec (Aspirin) .Collin Holt... Take one tablet by mouth daily  Problem # 2:  HYPERTENSION, BENIGN (ICD-401.1) we will monitor his blood pressure off the beta blocker.  The following medications were removed from the medication list:    Bystolic 5 Mg Tabs (Nebivolol hcl) .Collin Holt... 1 by mouth once daily His updated medication list for this problem includes:    Aspirin Ec 325 Mg Tbec (Aspirin) .Collin Holt... Take one tablet by mouth daily     Furosemide 20 Mg Tabs (Furosemide) .Collin Holt... Take one tablet by mouth two times a day.  Orders: T-Basic Metabolic Panel [id number])  Problem # 3:  ATRIAL FIBRILLATION (ICD-427.31) No clear signal that he is having any underlying arrhythmia. A Holter monitor could be done if his echocardiogram and stress test are normal.  The following medications were removed from the medication list:    Bystolic 5 Mg Tabs (Nebivolol hcl) .Collin Holt... 1 by mouth once daily His updated medication list for this problem includes:    Aspirin Ec 325 Mg Tbec (Aspirin) .Collin Holt... Take one tablet by mouth daily  Other Orders: T-BNP  (B Natriuretic Peptide) ([id number]) Echocardiogram (Echo) Nuclear Stress Test (Nuc Stress Test)  Patient Instructions: 1)  Your physician recommends that you schedule a follow-up appointment in: 2 weeks 2)  Your physician has recommended you make the following change in your medication: STOP Bystolic. START Lasix 20mg  two times a day. 3)  Your physician has requested that you have an echocardiogram.  Echocardiography is a painless test that uses sound waves to create images of your heart. It provides your doctor with information about the size and shape of your heart  and how well your heart's chambers and valves are working.  This procedure takes approximately one hour. There are no restrictions for this procedure. 4)  Your physician has requested that you have a Lexiscan myoview.   Prescriptions: FUROSEMIDE 20 MG TABS (FUROSEMIDE) Take one tablet by mouth two times a day.  #60 x 6   Entered by:   Lanny Hurst RN   Authorized by:   Dossie Arbour MD   Signed by:   Lanny Hurst RN on 01/16/2010   Method used:   Electronically to        [facility]  [address] (retail)       [address], [address]       [place], [place]  [address]       Ph: [phone]       Fax: [phone]   RxID:   [id number]   Appended Document: SOB, CHEST TIGHTNESS/SAB BMP and BNP are  normal.

## 2010-02-19 NOTE — Progress Notes (Signed)
Summary: RESULTS - no improvement  Phone Note Call from Patient   Caller: Wife, Collin Holt [phone] Summary of Call: Pt's wife called. Pt req results of tests, he has make no improvement since last office visit. Continues to have symptoms, some increase in coughing - yesterday walking around in [employer] pt c/o nausea, sob, fatigue & chest tightness. He has trouble exhaling, feels flu-like w/no fever and chest tightness. SOB is w/minor activities -putting socks on etc... Initial call taken by: Lamar Sprinkles, CMA,  January 13, 2010 11:33 AM  Follow-up for Phone Call        called and spoke with patient and his wife. Reviewed the PFTs - normal. Patient continues to have SOB and Chest tightness with no pulmonary explanation.  Plan - advised that he call Dr. Mariah Milling, [facility] for a soon appointment. If there is any difficulty it getting an appointment they should let me know.  Follow-up by: Jacques Navy MD,  January 13, 2010 12:58 PM

## 2010-02-19 NOTE — Progress Notes (Signed)
Summary: Nuclear pre procedure  Phone Note Outgoing Call Call back at Home Phone [phone]   Call placed by: Rea [employer], CMA,  January 20, 2010 2:53 PM Call placed to: Patient Summary of Call: Reviewed information on Myoview Information Sheet (see scanned document for further details).  Arline Asp spoke with patient.      Nuclear Med Background Indications for Stress Test: Evaluation for Ischemia   History: Echo  History Comments: '09 Echo:EF=55%; h/o afib.  Symptoms: Chest Pain, Chest Tightness, Dizziness, DOE, Nausea, SOB    Nuclear Pre-Procedure Cardiac Risk Factors: History of Smoking, Hypertension, Lipids, Obesity Height (in): 72

## 2010-02-19 NOTE — Miscellaneous (Signed)
Summary: Orders Update pft charges  Clinical Lists Changes  Orders: Added new Service order of Carbon Monoxide diffusing w/capacity (94720) - Signed Added new Service order of Lung Volumes (94240) - Signed Added new Service order of Spirometry (Pre & Post) (94060) - Signed 

## 2010-02-19 NOTE — Progress Notes (Signed)
  Phone Note Other Incoming   Request: Send information Summary of Call: Records received from [facility]. 17 pages forwarded to Dr. Debby Bud for review.

## 2010-02-19 NOTE — Miscellaneous (Signed)
Summary: CPX  Clinical Lists Changes  Orders: Added new Referral order of CPX Test at [facility] (CPX Test) - Signed 

## 2010-02-19 NOTE — Letter (Signed)
Summary: [facility]   Imported By: Sherian Rein 02/02/2010 09:40:06  _____________________________________________________________________  External Attachment:    Type:   Image     Comment:   External Document

## 2010-02-19 NOTE — Progress Notes (Signed)
Summary: SOB  Phone Note Call from Patient Call back at Home Phone [phone]   Caller: Eunice Blase Call For: Nurse/Gollan Summary of Call: pt's SOB is worse, feeling weak and tired after long night of sleep. Also wants results from blood work. Initial call taken by: Lysbeth Galas CMA,  January 20, 2010 10:32 AM  Follow-up for Phone Call        Spoke to pt's wife, SOB incr since weekend, incr in sleep and fatigue. Pt feels he cannot walk down hallway without getting SOB. Pt's echo and myoview scheduled tomorrow in [place]. Advised wife to take pt to ER if he feels he cannot wait for procedure tomorrow and to notify [facility] that he is scheduled for these tests. She will keep [place] updated of any changes.  Follow-up by: Lanny Hurst RN,  January 20, 2010 4:03 PM

## 2010-02-19 NOTE — Assessment & Plan Note (Signed)
Summary: Cardiology Nuclear Testing  Nuclear Med Background Indications for Stress Test: Evaluation for Ischemia   History: Echo  History Comments: '09 Echo:EF=55%; h/o afib.  Symptoms: Chest Pain, Chest Tightness, Dizziness, DOE, Nausea, SOB    Nuclear Pre-Procedure Cardiac Risk Factors: History of Smoking, Hypertension, Lipids, Obesity Caffeine/Decaff Intake: None NPO After: 7:30 PM IV 0.9% NS with Angio Cath: 22g     IV Site: R Hand IV Started by: Bonnita Levan, RN Chest Size (in) 52     Height (in): 72 Weight (lb): 278 BMI: 37.84  Nuclear Med Study 1 or 2 day study:  1 day     Stress Test Type:  Treadmill/Lexiscan Reading MD:  Cassell Clement, MD     Referring MD:  T.Gollan Resting Radionuclide:  Technetium 40m Tetrofosmin     Resting Radionuclide Dose:  11 mCi  Stress Radionuclide:  Technetium 74m Tetrofosmin     Stress Radionuclide Dose:  33 mCi   Stress Protocol  Max Systolic BP: 126 mm Hg Lexiscan: 0.4 mg   Stress Test Technologist:  Milana Na, EMT-P     Nuclear Technologist:  Domenic Polite, CNMT  Rest Procedure  Myocardial perfusion imaging was performed at rest 45 minutes following the intravenous administration of Technetium 60m Tetrofosmin.  Stress Procedure  The patient received IV Lexiscan 0.4 mg over 15-seconds with concurrent low level exercise and then Technetium 54m Tetrofosmin was injected at 30-seconds while the patient continued walking one more minute.  There were no significant changes with Lexiscan.  Quantitative spect images were obtained after a 45 minute delay.  QPS Raw Data Images:  Normal; no motion artifact; normal heart/lung ratio. Stress Images:  Normal homogeneous uptake in all areas of the myocardium. Rest Images:  Normal homogeneous uptake in all areas of the myocardium. Subtraction (SDS):  No evidence of ischemia. Transient Ischemic Dilatation:  .95  (Normal <1.22)  Lung/Heart Ratio:  .32  (Normal <0.45)  Quantitative  Gated Spect Images QGS EDV:  149 ml QGS ESV:  62 ml QGS EF:  58 % QGS cine images:  No wall motion abnormalities.  Findings Normal nuclear study      Overall Impression  Exercise Capacity: Lexiscan with no exercise. BP Response: Normal blood pressure response. Clinical Symptoms: Dyspnea and dizziness ECG Impression: No significant ST segment change suggestive of ischemia. Overall Impression: Normal stress nuclear study.

## 2010-02-19 NOTE — Miscellaneous (Signed)
Summary: CPX  Clinical Lists Changes  Orders: Added new Referral order of CPX Test at [facility] (CPX Test) - Signed

## 2010-02-19 NOTE — Progress Notes (Signed)
Summary: No improvement in symptoms  Phone Note Call from Patient Call back at Home Phone [phone]   Caller: Spouse Summary of Call: pt feels horrible, no better. C/O SOB, weakness, fatigue. Pulmonary and cardiac eval both came back ok. Pt is at whits end getting ready to go to ER. Would like advisment from Dr. Debby Bud before he goes to ER. Initial call taken by: Lanier Prude, [facility]),  January 22, 2010 5:17 PM  Follow-up for Phone Call        called patient: he is scheduled for cardio-pulmonary stress next wednsday.  He is advised to contact Ami for any acute problems so we can provide help and avoid the ED.  Follow-up by: Jacques Navy MD,  January 22, 2010 7:04 PM

## 2010-02-19 NOTE — Letter (Signed)
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     Sincerely,    Dr. Nicholes Mango

## 2010-02-19 NOTE — Letter (Signed)
Summary: Cardiac Catheterization Instructions- [facility]  [facility] at [address]   [place], [place] [address]   Phone: [phone]  Fax: [phone]     02/13/2010 MRN: [id number]  Collin Holt [address] [place], [place]  [address]  Dear [name],   You are scheduled for a Cardiac Catheterization on 02/17/10 with Dr. Gala Romney.  Please arrive to the 1st floor of the Heart and Vascular Center at [facility] at 8:30 am on the day of your procedure. Please do not arrive before 6:30 a.m. Call the Heart and Vascular Center at [phone] if you are unable to make your appointmnet. The Code to get into the parking garage under the building is 0300. Take the elevators to the 1st floor. You must have someone to drive you home. Someone must be with you for the first 24 hours after you arrive home. Please wear clothes that are easy to get on and off and wear slip-on shoes. Do not eat or drink after midnight except water with your medications that morning. Bring all your medications and current insurance cards with you.  _X__ Make sure you take your aspirin.  _X__ You may take ALL of your medications with water that morning. ________________________________________________________________________________________________________________________________   The usual length of stay after your procedure is 2 to 3 hours. This can vary.  If you have any questions, please call the office at [phone]   Lanny Hurst RN

## 2010-02-19 NOTE — Letter (Signed)
Summary: [facility]   Imported By: Sherian Rein 02/02/2010 09:39:05  _____________________________________________________________________  External Attachment:    Type:   Image     Comment:   External Document

## 2010-02-19 NOTE — Progress Notes (Signed)
  Phone Note Outgoing Call   Call placed by: Ami Bullins CMA,  January 06, 2010 9:35 AM Call placed to: [facility] Details for Reason: Waiting on call back Summary of Call: I am awaiting a call from [place] [facility] [place] clinic for results from cell count on a Bronchoalveolar Lavage.  Initial call taken by: Ami Bullins CMA,  January 06, 2010 9:35 AM  Follow-up for Phone Call        Lennox Laity to see if any results have been recieved waiting on call back Follow-up by: Ami Bullins CMA,  January 06, 2010 2:41 PM  Additional Follow-up for Phone Call Additional follow up Details #1::        results recieved, pt is set up for PFT on thursday with [place] [facility] Additional Follow-up by: Ami Bullins CMA,  January 06, 2010 4:31 PM

## 2010-02-19 NOTE — Letter (Signed)
Summary: Out of Work  [facility]  [address] [place], [place] [address]   Phone: [phone]  Fax: [phone]    January 14, 2010   Employee:  Lucious Elwood    To Whom It May Concern:   For Medical reasons, please excuse the above named employee from work for the following dates:  Start:   01/12/2010  End:   01/16/2010  If you need additional information, please feel free to contact our office.         Sincerely,    Lanier Prude, [facility]) for Illene Regulus, MD

## 2010-02-19 NOTE — Letter (Signed)
Summary: Work Excuse  Work Excuse   Imported By: Harlon Flor 01/27/2010 13:47:17  _____________________________________________________________________  External Attachment:    Type:   Image     Comment:   External Document

## 2010-02-19 NOTE — Progress Notes (Signed)
  Phone Note Call from Patient   Caller: Spouse Eunice Blase) Summary of Call: Please call regarding stress test/Echo pt. is very short of breath and is very exhausted.  Would like to know if this is heart or should he go see his PCP.  [phone]  Please call ASAP! Initial call taken by: Bishop Dublin, CMA,  January 21, 2010 4:12 PM  Follow-up for Phone Call        Notified patient's wife Eunice Blase) of Nuclear stress test & Echo results; looked normal per Dr. Mariah Milling.  The patient had a Chest CT that was normal.  Told need to come in office this week for an ETT. Told we will contact in the a.m. with what day and time to come for ETT. Follow-up by: Bishop Dublin, CMA,  January 21, 2010 5:35 PM  Additional Follow-up for Phone Call Additional follow up Details #1::        Notified patient's wife need to have an ETT @ [facility] on June 24, 2010 @ 8:00 should arrive at 7:30 a.m. Additional Follow-up by: Bishop Dublin, CMA,  January 22, 2010 8:49 AM

## 2010-02-19 NOTE — Progress Notes (Signed)
Summary: SOB/Palpitations  Phone Note Other Incoming   Caller: Pt's wife Summary of Call: Pt's wife called yesterday 02/11/10 stating pt c/o palpitations after minimal exertion. Pt c/o incr SOB when this occurs. Pt has had multiple diagnostic tests done with normal results, and at last ov with Dr. Gala Romney, possible heart catheterization was discussed even though symptoms do not seem cardiac in nature per Dr. Gala Romney. Pt is now requesting he have cath done. Dr. Gala Romney aware of pt request and we ordered cath for next tuesday 1.31.12. Pt has been doing work around the house today and has palpitations and SOB and sits down, advised pt to not do anything to exert himself until we do cath. Pt ok with this. Pt also has event monitor ordered, I beleive pt's wife told [employer] when they called to put a hold on this because we had scheduled cath, this was incorrect so I will call [employer] today and make sure it is to arrive asap. Also spoke to Dr. Mariah Milling with pt's symptoms. Advised pt's wife to obtain a digital BP/HR machine to check pt's HR when these episodes occur to make sure HR is not irregular, educated pt's wife how to know if it could be a fib. Instruted her to record #'s so we can have something to work with if needed. And if it looks like symptoms may be caused by arrythmia we can probably hold off on cath. Pt is ok with this and will get BP machine.  Initial call taken by: Lanny Hurst RN,  February 12, 2010 12:42 PM  Follow-up for Phone Call        02/12/10 Today spoke to pt's wife she reported BP/HR of averaging 150-165/95-105 and HR's 60-70s. These #'s are at rest. Pt is not doing anything strenuous today and will rest as instructed. He wanted [place] to know that when his breathing episodes occur, he feels on exhalation like he has "puffs of air" he is trying to get out. Dr. Mariah Milling aware of BP/HR results. Notified pt that we have performed almost every test that could be done for his symptoms except  for the event monitor and heart cath (which are scheduled) and that his BP is of no concern for causing his symptoms. His HR is stable and not significant of an electrical problem.  We have done 2 stress tests (Lexiscan and CPX), EKG, CT scan and PFT's in the past all of which were normal. Pt understands these tests were normal, just does not understand why he is feeling this way. Instructed pt that if he went to ER they are probably going to do tests that we have already done, however, if he is so uncomfortable that he cannot breathe adequately and feels he needs to go to ER to let them know what tests we have done and that he is scheduled for cath next tuesday. Pt is ok with this. Notified pt's wife if cath is normal as well as event monitor, the next step would be to refer back to Dr. Debby Bud that this is not evident of being cardiac related and some sort of anxiety treatment may be evaluated. Pt and his wife are ok with this and will keep [place] updated on status.  Follow-up by: Lanny Hurst RN,  February 12, 2010 12:42 PM

## 2010-02-25 NOTE — Progress Notes (Signed)
Summary: Cardiology Phone Note - Headache  Phone Note Call from Patient   Caller: Patient Summary of Call: Pt's wife called to say that her husband had a severe HA post cath. Per pt's wife, had cath around 9:30. Once home around 1:30 developed mild headache that got more severe by 3pm. At 3pm he took 3 200mg  ibuprofen tablets and felt like he needed to go to bed, so he did. Since then, his headache has gotten much better. The patient feels that being in a quiet dark room has helped and feels that it is easing off. He has had no other symptoms including visual changes, weakness, numbness, or balance problems. Instructed pt to continue to take ibuprofen 3 tablets every 6 hours through tomorrow as needed, and notify us if there is any worsening of the situation. Also instructed pt to proceed to ER if he does develop aforementioned neurologic deficits. He expressed understanding and gratitude. Initial call taken by: Ronie Spies PA-C

## 2010-02-25 NOTE — Letter (Signed)
Summary: At Home BP Readings  At Home BP Readings   Imported By: Harlon Flor 02/16/2010 08:16:40  _____________________________________________________________________  External Attachment:    Type:   Image     Comment:   External Document  Appended Document: At Home BP Readings BP numbers are mildly elevated. See what cath shows on tuesday. Continue to monitor BP. If it continues to be elevated, we can add medication for BP  Appended Document: At Home BP Readings Pt scheduled for cath today, will speak to pt tomorrow.  Appended Document: At Home BP Readings Spoke to pt's wife today, the cath was normal, pt feeling much better (phone note 02/18/10). Notified of above recommendations, pt will call with any changes.

## 2010-02-25 NOTE — Assessment & Plan Note (Signed)
Summary: ekg/sab  Nurse Visit  CC: SOB Comments Pt in for labs today for pre-cardiac cath, scheduled 02/17/10 with Dr. Gala Romney, and EKG done to r/o any dysrhythmia's to be causing pt's symptoms. He has had multiple diagnostic tests in the recent past that are normal. Pt also has event monitor to arrive to home soon. Pt does not take any cardiac medications. EKG today shows NSR.    Allergies: No Known Drug Allergies

## 2010-02-25 NOTE — Progress Notes (Signed)
Summary: FYI  Phone Note Outgoing Call   Caller: Spouse Summary of Call: Called pt's  Call placed by: Aundra Millet, RN Call placed to: Patient Summary of Call: Called pt this am to follow up on his symptoms post cath. Spoke to pt's wife, she said pt has been feeling much better since being on Augmentin (started 1 week ago) by Dr. Filbert Schilder. I saw that cath report was normal. Just wanted to let you be aware, she stated pt doesn't want to wear the event monitor given the results of the cath and since symptoms have improved. I have 3 days of the report and all NSR. Pt has consult for sleep study 03/04/10 with Dr. Craige Cotta. Initial call taken by: Lanny Hurst RN,  February 18, 2010 4:18 PM

## 2010-02-26 ENCOUNTER — Encounter: Payer: Self-pay | Admitting: Cardiovascular Disease

## 2010-02-26 ENCOUNTER — Telehealth: Payer: Self-pay | Admitting: Cardiovascular Disease

## 2010-03-04 ENCOUNTER — Institutional Professional Consult (permissible substitution): Payer: Self-pay | Admitting: Pulmonary Disease

## 2010-03-05 NOTE — Letter (Signed)
Summary: Letter  Letter   Imported By: Harlon Flor 02/27/2010 15:50:23  _____________________________________________________________________  External Attachment:    Type:   Image     Comment:   External Document

## 2010-03-05 NOTE — Letter (Signed)
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     Sincerely,       Carloyn Manner

## 2010-03-05 NOTE — Procedures (Signed)
Summary: [employer]  [employer]   Imported By: Harlon Flor 02/27/2010 11:20:39  _____________________________________________________________________  External Attachment:    Type:   Image     Comment:   External Document

## 2010-03-05 NOTE — Progress Notes (Signed)
Summary: Letter for work  Phone Note Call from Patient Call back at [employer] [phone]   Caller: Wife Call For: Collin Holt Summary of Call: Needs a release to return to work on Tuesday.  Please send to Mahala Menghini [phone] Initial call taken by: Harlon Flor,  February 26, 2010 8:56 AM  Follow-up for Phone Call        faxed work release to [employer] @ [phone]. Follow-up by: Lysbeth Galas CMA,  February 26, 2010 9:36 AM

## 2010-03-11 NOTE — Procedures (Signed)
  NAMEEDKER, Collin Holt                ACCOUNT NO.:  [id number]  MEDICAL RECORD NO.:  [id number]          PATIENT TYPE:  OIB  LOCATION:  1962                         FACILITY:  [facility]  PHYSICIAN:  Bevelyn Buckles. Damario Gillie, MDDATE OF BIRTH:  07-Jul-1960  DATE OF PROCEDURE:  02/17/2010 DATE OF DISCHARGE:                           CARDIAC CATHETERIZATION   PRIMARY CARE PHYSICIAN:  Duane Lope. Judithann Sheen, MD  INDICATIONS:  Mr. Huskins is a 50 year old male with a history of hypertension and obesity.  He recently had been experiencing significant dyspnea on exertion and chest pain.  Workup to this date has been unremarkable.  However, he has had persistent symptoms, and we have decided to pursue right and left heart cath for definitive workup.  PROCEDURES PERFORMED: 1. Right heart catheterization. 2. Selective coronary angiography. 3. Left heart catheterization. 4. Left ventriculogram.  DESCRIPTION OF PROCEDURE:  Risks and indication were explained.  Consent was signed and placed on the chart.  A 7-French venous sheath was placed in the right femoral vein, and a 4-French arterial sheath was placed in the right femoral artery using modified Seldinger technique.  A standard Swan-Ganz catheter was used for the right heart cath.  For the left heart cath, we used JL-4, 3D RCA, and angled pigtail.  All catheter exchanges were made over wire.  There were no apparent complications. Right atrial pressure mean of 7, RV pressure 31/8 with EDP of 11, PA pressure 21/11 with a mean of 15.  Pulmonary capillary wedge pressure mean of 11.  Central aortic pressure 117/86 with mean of 102.  LV pressure 118/60 with an EDP of 18.  Fick cardiac output 5.1.  Cardiac index 2.1.  Left main was normal.  LAD coursed to the apex.  It gave off a large first diagonal and small second diagonal.  They are angiographically normal.  Left circumflex gave a small OM-1, a large OM-2, and small distal AV groove branch that was  angiographically normal.  Right coronary artery was a dominant vessel that gave off an RV branch of PDA and two posterolaterals.  It was angiographically normal.  Left ventriculogram done in the RAO position shows an EF of 60-65% with no regional wall motion abnormalities.  ASSESSMENT: 1. Normal coronary arteries. 2. Normal left ventricular function. 3. Normal intracardiac and pulmonary artery pressures.  PLAN:  As discussion.  I suspect Antario's symptoms are related to his obesity, deconditioning, and possibly obstructive sleep apnea.  I have stressed the need for him to continue with exercise program, and proceed with a sleep study workup.     Bevelyn Buckles. Morad Tal, MD     DRB/MEDQ  D:  02/17/2010  T:  02/18/2010  Job:  [id number]  cc:   Antonieta Iba, MD  Electronically Signed by Arvilla Meres MD on 03/11/2010 02:03:49 PM

## 2010-03-11 NOTE — Cardiovascular Report (Signed)
Summary: Cath Order  Cath Order   Imported By: Harlon Flor 03/03/2010 16:34:52  _____________________________________________________________________  External Attachment:    Type:   Image     Comment:   External Document

## 2010-03-16 ENCOUNTER — Ambulatory Visit: Payer: Self-pay | Admitting: Internal Medicine

## 2010-04-04 ENCOUNTER — Ambulatory Visit (INDEPENDENT_AMBULATORY_CARE_PROVIDER_SITE_OTHER): Payer: BC Managed Care – PPO | Admitting: Family Medicine

## 2010-04-04 ENCOUNTER — Encounter: Payer: Self-pay | Admitting: Family Medicine

## 2010-04-04 DIAGNOSIS — J019 Acute sinusitis, unspecified: Secondary | ICD-10-CM | POA: Insufficient documentation

## 2010-04-07 NOTE — Assessment & Plan Note (Signed)
Summary: ?sinus infection/SD   Vital Signs:  Patient profile:   50 year old male Weight:      279 pounds Temp:     98.8 degrees F oral Pulse rate:   72 / minute Pulse rhythm:   regular BP sitting:   138 / 88  (left arm) Cuff size:   large  Vitals Entered By: Selena Batten Dance CMA Duncan Dull) (April 04, 2010 10:36 AM) CC: ? Sinus infection, URI symptoms   History of Present Illness:       This is a 50 year old man who presents with URI symptoms.  The symptoms began 2 days ago.  The patient denies nasal congestion, clear nasal discharge, purulent nasal discharge, sore throat, dry cough, productive cough, earache, and sick contacts.  The patient denies fever, low-grade fever (<100.5 degrees), fever of 100.5-103 degrees, fever of 103.1-104 degrees, fever to >104 degrees, stiff neck, dyspnea, wheezing, rash, vomiting, diarrhea, use of an antipyretic, and response to antipyretic.  The patient also reports headache.  The patient denies the following risk factors for Strep sinusitis: unilateral facial pain, unilateral nasal discharge, poor response to decongestant, double sickening, tooth pain, Strep exposure, tender adenopathy, and absence of cough.  Pt was seen in UC last month for sinus infection and was given abx and symptoms went away.  Pt has trouble with sinus headaches and infection.    Preventive Screening-Counseling & Management  Alcohol-Tobacco     Alcohol drinks/day: 1/2 WEEKEND     Alcohol type: wine     Smoking Status: quit     Packs/Day: 1.5     Year Started: 1977     Year Quit: 1995  Caffeine-Diet-Exercise     Caffeine use/day: 4/5 CUPS OF COFFEE     Does Patient Exercise: no     Type of exercise: WALKING  Problems Prior to Update: 1)  Sinusitis - Acute-nos  (ICD-461.9) 2)  Chest Pain-unspecified  (ICD-786.50) 3)  Chest Pain-precordial  (ICD-786.51) 4)  Shortness of Breath  (ICD-786.05) 5)  Dyspnea On Exertion  (ICD-786.09) 6)  Atrial Fibrillation  (ICD-427.31) 7)  Hypertension,  Benign  (ICD-401.1) 8)  Palpitations  (ICD-785.1)  Medications Prior to Update: 1)  Flomax 0.4 Mg Caps (Tamsulosin Hcl) .Marland Kitchen.. 1 By Mouth Once Daily 2)  Aspirin Ec 325 Mg Tbec (Aspirin) .... Take One Tablet By Mouth Daily 3)  Venlafaxine Hcl 75 Mg Tabs (Venlafaxine Hcl) .... One Tablet Once Daily 4)  Sudafed 30 Mg Tabs (Pseudoephedrine Hcl) .Marland Kitchen.. 1 Tablet Three Times A Day 5)  Mucinex Dm 30-600 Mg Xr12h-Tab (Dextromethorphan-Guaifenesin) .Marland Kitchen.. 1 Tablet Daily  Current Medications (verified): 1)  Flomax 0.4 Mg Caps (Tamsulosin Hcl) .Marland Kitchen.. 1 By Mouth Once Daily 2)  Aspirin Ec 325 Mg Tbec (Aspirin) .... Take One Tablet By Mouth Daily 3)  Venlafaxine Hcl 75 Mg Tabs (Venlafaxine Hcl) .... One Tablet Once Daily 4)  Sudafed 30 Mg Tabs (Pseudoephedrine Hcl) .Marland Kitchen.. 1 Tablet Three Times A Day 5)  Mucinex Dm 30-600 Mg Xr12h-Tab (Dextromethorphan-Guaifenesin) .Marland Kitchen.. 1 Tablet Daily 6)  Flonase 50 Mcg/act Susp (Fluticasone Propionate) .... 2 Sprays Each Nostril Once Daily 7)  Avelox 400 Mg Tabs (Moxifloxacin Hcl) .Marland Kitchen.. 1 By Mouth Once Daily  Allergies (verified): No Known Drug Allergies  Past History:  Past Medical History: Last updated: 08/31/2008 Atrial Fibrillation with rapid ventricular response Hypertension Intermittent tachypalpitations Borderline hyperlipidemia  Past Surgical History: Last updated: 09/02/2008 2X RIGHT SHOULDER  Family History: Last updated: 2010/01/13 Father - deceased @ 65: Cancer with mets;  emphysema Mother - 73: good health, on no meds No family history of coronary artery disease; colon or prostate cancer; no DM  Social History: Last updated: 01/05/2010 HSG Married - '82 - 17 yrs/divorced; married '06.  He has 4 daughters. Work: truck Hospital doctor - long haul.   He drinks wine on and off twice a week. Tobacco Use - Former.  Regular Exercise - no  Risk Factors: Alcohol Use: 1/2 WEEKEND (04/04/2010) Caffeine Use: 4/5 CUPS OF COFFEE (04/04/2010) Exercise: no  (04/04/2010)  Risk Factors: Smoking Status: quit (04/04/2010) Packs/Day: 1.5 (04/04/2010)  Family History: Reviewed history from 01/05/2010 and no changes required. Father - deceased @ 22: Cancer with mets; emphysema Mother - 45: good health, on no meds No family history of coronary artery disease; colon or prostate cancer; no DM  Social History: Reviewed history from 01/05/2010 and no changes required. HSG Married - '82 - 17 yrs/divorced; married '06.  He has 4 daughters. Work: truck Hospital doctor - long haul.   He drinks wine on and off twice a week. Tobacco Use - Former.  Regular Exercise - no  Review of Systems      See HPI  Physical Exam  General:  Well-developed,well-nourished,in no acute distress; alert,appropriate and cooperative throughout examination Ears:  External ear exam shows no significant lesions or deformities.  Otoscopic examination reveals clear canals, tympanic membranes are intact bilaterally without bulging, retraction, inflammation or discharge. Hearing is grossly normal bilaterally. Nose:  mucosal erythema and L frontal sinus tenderness.   Mouth:  Oral mucosa and oropharynx without lesions or exudates.  Teeth in good repair. Neck:  No deformities, masses, or tenderness noted. Lungs:  Normal respiratory effort, chest expands symmetrically. Lungs are clear to auscultation, no crackles or wheezes. Heart:  normal rate and no murmur.   Psych:  Cognition and judgment appear intact. Alert and cooperative with normal attention span and concentration. No apparent delusions, illusions, hallucinations   Impression & Recommendations:  Problem # 1:  SINUSITIS - ACUTE-NOS (ICD-461.9)  His updated medication list for this problem includes:    Sudafed 30 Mg Tabs (Pseudoephedrine hcl) .Marland Kitchen... 1 tablet three times a day    Mucinex Dm 30-600 Mg Xr12h-tab (Dextromethorphan-guaifenesin) .Marland Kitchen... 1 tablet daily    Flonase 50 Mcg/act Susp (Fluticasone propionate) .Marland Kitchen... 2 sprays  each nostril once daily    Avelox 400 Mg Tabs (Moxifloxacin hcl) .Marland Kitchen... 1 by mouth once daily  Instructed on treatment. Call if symptoms persist or worsen.   Complete Medication List: 1)  Flomax 0.4 Mg Caps (Tamsulosin hcl) .Marland Kitchen.. 1 by mouth once daily 2)  Aspirin Ec 325 Mg Tbec (Aspirin) .... Take one tablet by mouth daily 3)  Venlafaxine Hcl 75 Mg Tabs (Venlafaxine hcl) .... One tablet once daily 4)  Sudafed 30 Mg Tabs (Pseudoephedrine hcl) .Marland Kitchen.. 1 tablet three times a day 5)  Mucinex Dm 30-600 Mg Xr12h-tab (Dextromethorphan-guaifenesin) .Marland Kitchen.. 1 tablet daily 6)  Flonase 50 Mcg/act Susp (Fluticasone propionate) .... 2 sprays each nostril once daily 7)  Avelox 400 Mg Tabs (Moxifloxacin hcl) .Marland Kitchen.. 1 by mouth once daily Prescriptions: AVELOX 400 MG TABS (MOXIFLOXACIN HCL) 1 by mouth once daily  #10 x 0   Entered and Authorized by:   Loreen Freud DO   Signed by:   Loreen Freud DO on 04/04/2010   Method used:   Print then Give to Patient   RxID:   [id number] FLONASE 50 MCG/ACT SUSP (FLUTICASONE PROPIONATE) 2 sprays each nostril once daily  #1 x 1  Entered and Authorized by:   Loreen Freud DO   Signed by:   Loreen Freud DO on 04/04/2010   Method used:   Electronically to        [facility] (retail)       [address]       [place], [place]  [address]       Ph: [phone]       Fax: [phone]   RxID:   [id number]    Orders Added: 1)  Est. Patient Level III [84132]    Current Allergies (reviewed today): No known allergies

## 2010-04-20 ENCOUNTER — Ambulatory Visit (INDEPENDENT_AMBULATORY_CARE_PROVIDER_SITE_OTHER): Payer: BC Managed Care – PPO | Admitting: Internal Medicine

## 2010-04-20 ENCOUNTER — Encounter: Payer: Self-pay | Admitting: Internal Medicine

## 2010-04-20 VITALS — BP 130/88 | HR 60 | Temp 98.6°F | Wt 281.0 lb

## 2010-04-20 DIAGNOSIS — R51 Headache: Secondary | ICD-10-CM

## 2010-04-20 NOTE — Progress Notes (Signed)
  Subjective:    Patient ID: Collin Holt, male    DOB: Nov 23, 1960, 50 y.o.   MRN: [id number]  HPI Patient presents for recurrent and persistent frontal headache -usually right more than left. He denies any recurrent fevers, no drainage. He gets relief with spice, halpenos, use of saline irrigation. He is pain free today. He does use flonase nasal spray daily.   He has labyrinthitis and does well with vestibular exercise and this has subsided.     Review of Systems  Constitutional: Negative for fever, chills, diaphoresis, activity change and fatigue.  HENT: Positive for rhinorrhea and sinus pressure. Negative for hearing loss, neck pain, tinnitus and ear discharge.   Eyes: Negative.   Respiratory: Negative for apnea, cough, choking and wheezing.   Cardiovascular: Negative for chest pain and leg swelling.  Gastrointestinal: Negative.   Musculoskeletal: Negative.   Neurological: Negative.   Hematological: Negative.        Objective:   Physical Exam  Constitutional: He is oriented to person, place, and time. He appears well-developed and well-nourished. No distress.  HENT:  Head: Normocephalic and atraumatic.  Right Ear: Hearing, tympanic membrane, external ear and ear canal normal. No tenderness. No decreased hearing is noted.  Left Ear: Hearing, tympanic membrane, external ear and ear canal normal. No tenderness. No decreased hearing is noted.       Decreased transillumination of the frontal and maxillary sinus.   Cardiovascular: Regular rhythm.   Pulmonary/Chest: Breath sounds normal. He has no wheezes. He has no rales.  Neurological: He is alert and oriented to person, place, and time. He has normal reflexes. No cranial nerve deficit.  Skin: Skin is warm and dry.  Psychiatric: He has a normal mood and affect. His behavior is normal. Thought content normal.          Assessment & Plan:  1. Sinus headache- history of deviated septum - repaired. Has had chronic recurrent allergic  rhinitis. He has had persistent recurrent sinus pressure type headache with no evidence now of infection.  Plan - continue supportive care: flonase nasal spray; saline wash; spicy food           CT sinus to r/o chronic sinusitis, anatomic obstruction.  2. Labyrinthitis - improved.  3. DOE/SOB - had full work-up with Cardiac cath - negative; cardio-pulmonary stress testing - negative. He was advised to loose weight. He is working on this. Discussed weight management with smart food choices, portion size control and exercise. Weight is 281, [employer] weight 240. Goal is to loose 1-2 lbs per month.

## 2010-04-27 ENCOUNTER — Ambulatory Visit (INDEPENDENT_AMBULATORY_CARE_PROVIDER_SITE_OTHER)
Admission: RE | Admit: 2010-04-27 | Discharge: 2010-04-27 | Disposition: A | Discharge status: Home / Self Care | Payer: BC Managed Care – PPO | Source: Ambulatory Visit | Attending: Internal Medicine | Admitting: Internal Medicine

## 2010-04-27 ENCOUNTER — Telehealth: Payer: Self-pay | Admitting: Internal Medicine

## 2010-04-27 ENCOUNTER — Encounter: Payer: Self-pay | Admitting: *Deleted

## 2010-04-27 DIAGNOSIS — R51 Headache: Secondary | ICD-10-CM

## 2010-04-27 MED ORDER — AMOXICILLIN-POT CLAVULANATE 875-125 MG PO TABS: 1.0000 | ORAL_TABLET | Freq: Two times a day (BID) | ORAL | Status: AC

## 2010-04-27 NOTE — Telephone Encounter (Signed)
Pt aware, he needs out of work note for this week, OK?

## 2010-04-27 NOTE — Telephone Encounter (Signed)
Ok for out of work note, I assume on the basis of the sinus problems

## 2010-04-27 NOTE — Telephone Encounter (Signed)
Please call patient: CT reveals mucosal thickening in multiple sinus cavities. As discussed will treat with 14 days of augmentin 875mg  bid (Rx eScribed to [facility] [address] If he continues to have recurrent congestion symptoms and pain will need to see ENT.

## 2010-04-27 NOTE — Telephone Encounter (Signed)
Done, given to patient's daughter

## 2010-04-28 ENCOUNTER — Telehealth: Payer: Self-pay | Admitting: *Deleted

## 2010-04-28 NOTE — Telephone Encounter (Signed)
Patient informed. 

## 2010-04-28 NOTE — Telephone Encounter (Signed)
Pt continues to c/o headaches and sinus pain which has kept him up all night. He started abx yesterday. NO fever or chills. Currently taking asp 325 mg qam as reccommended by cardiology. He has tried ibuprofen 400mg  every 8 hours w/no relief. Heating pad and hot water in the shower help some but only for short time. He is also not taking any other OTC antihistamines OR decongestants. What do you suggest to help with the pain?

## 2010-04-28 NOTE — Telephone Encounter (Signed)
Vaporizer treatments, sudafed (generic) 30mg  tid. Watch the blood pressure.

## 2010-05-01 ENCOUNTER — Ambulatory Visit (INDEPENDENT_AMBULATORY_CARE_PROVIDER_SITE_OTHER): Payer: BC Managed Care – PPO | Admitting: Internal Medicine

## 2010-05-01 ENCOUNTER — Encounter: Payer: Self-pay | Admitting: Internal Medicine

## 2010-05-01 VITALS — BP 122/80 | HR 88 | Temp 97.7°F | Wt 278.0 lb

## 2010-05-01 DIAGNOSIS — I10 Essential (primary) hypertension: Secondary | ICD-10-CM

## 2010-05-01 DIAGNOSIS — G43009 Migraine without aura, not intractable, without status migrainosus: Secondary | ICD-10-CM

## 2010-05-01 DIAGNOSIS — J019 Acute sinusitis, unspecified: Secondary | ICD-10-CM

## 2010-05-01 MED ORDER — METHYLPREDNISOLONE ACETATE 80 MG/ML IJ SUSP
120.0000 mg | Freq: Once | INTRAMUSCULAR | Status: AC
  Administered 2010-05-01: 120 mg via INTRAMUSCULAR

## 2010-05-01 MED ORDER — OXYCODONE-ACETAMINOPHEN 7.5-325 MG PO TABS: 1.0000 | ORAL_TABLET | ORAL | Status: DC | PRN

## 2010-05-01 MED ORDER — PROMETHAZINE HCL 25 MG/ML IJ SOLN
25.0000 mg | Freq: Four times a day (QID) | INTRAMUSCULAR | Status: DC | PRN
  Administered 2010-05-01: 25 mg via INTRAVENOUS

## 2010-05-01 MED ORDER — PROMETHAZINE HCL 12.5 MG PO TABS: 12.5000 mg | ORAL_TABLET | Freq: Four times a day (QID) | ORAL | Status: DC | PRN

## 2010-05-01 MED ORDER — MEPERIDINE HCL 25 MG/ML IJ SOLN
25.0000 mg | Freq: Once | INTRAMUSCULAR | Status: AC
  Administered 2010-05-01: 25 mg via INTRAMUSCULAR

## 2010-05-01 NOTE — Assessment & Plan Note (Signed)
I think he has a protracted migraine HA and that it is too late to give him a triptan, I think he probably has some vessel inflammtion so will give him a dose of depo-medrol IM and control the pain here with shots of demerol and phenergan and the scripts for percocet and phenergan

## 2010-05-01 NOTE — Patient Instructions (Signed)
Migraine Headache   A migraine headache is an intense, throbbing pain on one or both sides of your head. The exact cause of a migraine headache is not always known. A migraine may be caused when nerves in the brain become irritated and release chemicals that cause swelling (inflammation) within blood vessels, causing pain. Many migraine sufferers have a family history of migraines. Before you get a migraine you may or may not get an aura. An aura is a group of symptoms that can predict the beginning of a migraine. An aura may include:   Visual changes such as:   Flashing lights.   Seeing bright spots or zig-zag lines.   Tunnel vision.   Feelings of numbness.   Trouble talking.   Muscle weakness.   SYMPTOMS OF A MIGRAINE   A migraine headache has one or more of the following symptoms:   Pain on one or both sides of your head.   Pain that is pulsating or throbbing in nature.   Pain that is severe enough to prevent daily activities.   Pain that is aggravated by any daily physical activity.   Nausea (feeling sick to your stomach), vomiting or both.   Pain with exposure to bright lights, loud noises or activity.   General sensitivity to bright lights or loud noises.   MIGRAINE TRIGGERS   A migraine headache can be triggered by many things. Examples of triggers include:   Alcohol.   Smoking.   Stress.   It may be related to menses (male menstruation).   Aged cheeses.   Foods or drinks that contain nitrates, glutamate, aspartame or tyramine.   Lack of sleep.   Chocolate.   Caffeine.   Hunger.   Medications such as nitroglycerine (used to treat chest pain), birth control pills, estrogen and some blood pressure medications.   DIAGNOSIS   A migraine headache is often diagnosed based on:   Your symptoms.   Physical examination.   A CT scan of your head may be ordered to see if your headaches are caused from other medical conditions.   HOME CARE INSTRUCTIONS   Medications can help prevent migraines if they are recurrent or  should they become recurrent. Your caregiver can help you with a medication or treatment program that will be helpful to you.   If you get a migraine, it may be helpful to lie down in a dark, quiet room.   It may be helpful to keep a headache diary. This may help you find a trend as to what may be triggering your headaches.   SEEK IMMEDIATE MEDICAL CARE IF:   You do not get relief from the medications given to you or you have a recurrence of pain.   You have confusion, personality changes or seizures.   You have headaches that wake you from sleep.   You have an increased frequency in your headaches.   You have a stiff neck.   You have a loss of vision.   You have muscle weakness.   You start losing your balance or have trouble walking.   You feel faint or pass out.   MAKE SURE YOU:   Understand these instructions.   Will watch your condition.   Will get help right away if you are not doing well or get worse.   Document Released: 01/04/2005 Document Re-Released: 11/01/2008   [employer]® Patient Information ©2011 [employer].

## 2010-05-01 NOTE — Assessment & Plan Note (Signed)
His BP is well controlled 

## 2010-05-01 NOTE — Progress Notes (Signed)
Subjective:    Patient ID: Collin Holt, male    DOB: [birth year], 50 y.o.   MRN: [id number]  Sinus Problem Associated symptoms include headaches. Pertinent negatives include no chills, congestion, coughing, diaphoresis, ear pain, neck pain, shortness of breath, sinus pressure, sneezing, sore throat or swollen glands.  Sinusitis Associated symptoms include headaches. Pertinent negatives include no chills, congestion, coughing, diaphoresis, ear pain, neck pain, shortness of breath, sinus pressure, sneezing, sore throat or swollen glands.  Headache  This is a recurrent problem. The current episode started in the past 7 days. The problem occurs intermittently. The problem has been gradually worsening. The pain is located in the bilateral and frontal region. The pain does not radiate. The pain quality is similar to prior headaches. The quality of the pain is described as dull and throbbing. The pain is at a severity of 7/10. The pain is moderate. Associated symptoms include phonophobia and photophobia. Pertinent negatives include no abdominal pain, abnormal behavior, anorexia, back pain, blurred vision, coughing, dizziness, drainage, ear pain, eye pain, eye redness, eye watering, facial sweating, fever, hearing loss, insomnia, loss of balance, muscle aches, nausea, neck pain, numbness, rhinorrhea, scalp tenderness, seizures, sinus pressure, sore throat, swollen glands, tingling, tinnitus, visual change, vomiting, weakness or weight loss. He has tried acetaminophen and NSAIDs for the symptoms. The treatment provided no relief. His past medical history is significant for migraine headaches.      Review of Systems  Constitutional: Negative for fever, chills, weight loss, diaphoresis, activity change, appetite change, fatigue and unexpected weight change.  HENT: Negative for hearing loss, ear pain, nosebleeds, congestion, sore throat, facial swelling, rhinorrhea, sneezing, drooling, mouth sores, trouble  swallowing, neck pain, neck stiffness, voice change, postnasal drip, sinus pressure, tinnitus and ear discharge.   Eyes: Positive for photophobia. Negative for blurred vision, pain, discharge, redness, itching and visual disturbance.  Respiratory: Negative for apnea, cough, choking, chest tightness, shortness of breath, wheezing and stridor.   Cardiovascular: Negative for chest pain, palpitations and leg swelling.  Gastrointestinal: Negative for nausea, vomiting, abdominal pain, diarrhea, constipation, blood in stool, abdominal distention and anorexia.  Genitourinary: Negative for dysuria, urgency, frequency and hematuria.  Musculoskeletal: Negative for myalgias, back pain, joint swelling, arthralgias and gait problem.  Skin: Negative for color change, pallor and rash.  Neurological: Positive for headaches. Negative for dizziness, tingling, tremors, seizures, syncope, facial asymmetry, speech difficulty, weakness, light-headedness, numbness and loss of balance.  Hematological: Negative for adenopathy. Does not bruise/bleed easily.  Psychiatric/Behavioral: Negative for hallucinations, behavioral problems, confusion, self-injury, dysphoric mood, decreased concentration and agitation. The patient is not nervous/anxious, does not have insomnia and is not hyperactive.        Objective:   Physical Exam  [nursing notereviewed. Constitutional: He appears well-developed and well-nourished. No distress.  HENT:  Head: Normocephalic and atraumatic.  Right Ear: External ear normal.  Left Ear: External ear normal.  Nose: Nose normal.  Mouth/Throat: Oropharynx is clear and moist. No oropharyngeal exudate.  Eyes: Conjunctivae and EOM are normal. Pupils are equal, round, and reactive to light. Right eye exhibits no discharge. Left eye exhibits no discharge. No scleral icterus.  Neck: Normal range of motion. No JVD present. No thyromegaly present.  Cardiovascular: Normal rate, regular rhythm, normal heart  sounds and intact distal pulses.  Exam reveals no gallop and no friction rub.   No murmur heard. Pulmonary/Chest: Effort normal and breath sounds normal. No respiratory distress. He has no wheezes. He has no rales. He exhibits no tenderness.  Abdominal:  Soft. Bowel sounds are normal. He exhibits no distension and no mass. There is no tenderness. There is no rebound and no guarding.  Musculoskeletal: Normal range of motion. He exhibits no edema and no tenderness.  Lymphadenopathy:    He has no cervical adenopathy.  Neurological: He is alert. He has normal strength. He is not disoriented. He displays no atrophy and no tremor. No cranial nerve deficit or sensory deficit. He exhibits normal muscle tone. He displays a negative Romberg sign. He displays no seizure activity. Coordination and gait normal. He displays no Babinski's sign on the right side. He displays no Babinski's sign on the left side.  Reflex Scores:      Tricep reflexes are 1+ on the right side and 1+ on the left side.      Bicep reflexes are 1+ on the right side and 1+ on the left side.      Brachioradialis reflexes are 1+ on the right side and 1+ on the left side.      Patellar reflexes are 1+ on the right side and 1+ on the left side.      Achilles reflexes are 1+ on the right side and 1+ on the left side. Skin: Skin is warm and dry. No rash noted. He is not diaphoretic. No erythema. No pallor.  Psychiatric: He has a normal mood and affect. His behavior is normal. Judgment and thought content normal.          Assessment & Plan:

## 2010-05-04 ENCOUNTER — Ambulatory Visit: Payer: Self-pay | Admitting: Unknown Physician Specialty

## 2010-05-07 ENCOUNTER — Telehealth: Payer: Self-pay | Admitting: *Deleted

## 2010-05-07 DIAGNOSIS — G43009 Migraine without aura, not intractable, without status migrainosus: Secondary | ICD-10-CM

## 2010-05-07 MED ORDER — OXYCODONE-ACETAMINOPHEN 7.5-325 MG PO TABS: 1.0000 | ORAL_TABLET | ORAL | Status: DC | PRN

## 2010-05-07 NOTE — Telephone Encounter (Signed)
Pt aware.

## 2010-05-07 NOTE — Telephone Encounter (Signed)
done

## 2010-05-07 NOTE — Telephone Encounter (Signed)
Patient requesting refill on oxycodone. He is worried about recurrent headaches and wants to have something on hand. Please advise.

## 2010-05-12 ENCOUNTER — Telehealth: Payer: Self-pay | Admitting: *Deleted

## 2010-05-12 DIAGNOSIS — R51 Headache: Secondary | ICD-10-CM

## 2010-05-12 NOTE — Telephone Encounter (Signed)
Pt c/o persistent daily migraines and would like neurology referral.

## 2010-05-12 NOTE — Telephone Encounter (Signed)
Patient informed. 

## 2010-05-12 NOTE — Telephone Encounter (Signed)
Order placed for referral to Dr. Vela Prose for headache eval

## 2010-05-13 ENCOUNTER — Ambulatory Visit (INDEPENDENT_AMBULATORY_CARE_PROVIDER_SITE_OTHER): Payer: BC Managed Care – PPO | Admitting: Internal Medicine

## 2010-05-13 ENCOUNTER — Encounter: Payer: Self-pay | Admitting: Internal Medicine

## 2010-05-13 VITALS — BP 130/80 | HR 61 | Temp 97.1°F | Resp 16 | Wt 281.0 lb

## 2010-05-13 DIAGNOSIS — G43009 Migraine without aura, not intractable, without status migrainosus: Secondary | ICD-10-CM

## 2010-05-13 MED ORDER — MEPERIDINE HCL 75 MG/ML IJ SOLN
75.0000 mg | Freq: Once | INTRAMUSCULAR | Status: AC
  Administered 2010-05-13: 75 mg via INTRAMUSCULAR

## 2010-05-13 MED ORDER — KETOROLAC TROMETHAMINE 60 MG/2ML IJ SOLN
60.0000 mg | Freq: Once | INTRAMUSCULAR | Status: AC
  Administered 2010-05-13: 60 mg via INTRAMUSCULAR

## 2010-05-13 MED ORDER — PROMETHAZINE HCL 25 MG/ML IJ SOLN
50.0000 mg | Freq: Once | INTRAMUSCULAR | Status: AC
  Administered 2010-05-13: 25 mg via INTRAMUSCULAR

## 2010-05-13 MED ORDER — MEPERIDINE HCL 50 MG PO TABS: 50.0000 mg | ORAL_TABLET | ORAL | Status: AC | PRN

## 2010-05-13 MED ORDER — PROMETHAZINE HCL 25 MG/ML IJ SOLN
50.0000 mg | Freq: Once | INTRAMUSCULAR | Status: AC
  Administered 2010-05-13: 50 mg via INTRAMUSCULAR

## 2010-05-13 NOTE — Progress Notes (Signed)
Subjective:    Patient ID: Collin Holt, male    DOB: Sep 14, 1960, 50 y.o.   MRN: [id number]  HPI Collin Holt has been having recurrent frontal headaches for 2 weeks or more. He was diagnosed with chronic sinusitis based on CT sinuses revealing sinus mucosal thickening. Subsequently he saw ENT in [place] - he stated there was no infection. Patient had an MRI to r/o aneurysm (MRA?). For his headaches he has [facility] and [facility]. He has had relief with ketorolac. He describes a frontal headache, crushing in nature with minimal photophobia, some nausea. He has no focal neurologic symptoms.   Past Medical History  Diagnosis Date  . Atrial fibrillation with rapid ventricular response   . HTN (hypertension)   . Palpitations     Tachy, intermittent  . Borderline hyperlipidemia    Past Surgical History  Procedure Date  . Shoulder surgery     Right x 2   Family History  Problem Relation Age of Onset  . Emphysema Father   . Cancer Father   . Prostate cancer Neg Hx   . Colon cancer Neg Hx   . Coronary artery disease Neg Hx   . Diabetes Neg Hx    History   Social History  . Marital Status: Married    Spouse Name: N/A    Number of Children: N/A  . Years of Education: N/A   Occupational History  . Not on file.   Social History Main Topics  . Smoking status: Former Games developer  . Smokeless tobacco: Not on file  . Alcohol Use: Yes     wine on and off 2 x's week  . Drug Use: Not on file  . Sexually Active: Not on file   Other Topics Concern  . Not on file   Social History Narrative   HSGMarried - '82 - 17 yrs/divorced; married '06. He has 4 daughtersRegular Exercise -  NO       Review of Systems Review of Systems  Constitutional:  Negative for fever, chills, activity change and unexpected weight change.  HENT:  Negative for hearing loss, ear pain, congestion, neck stiffness and postnasal drip.   Eyes: Negative for pain, discharge and visual  disturbance.  Respiratory: Negative for chest tightness and wheezing.   Cardiovascular: Negative for chest pain and palpitations.       [No decreased exercise tolerance Gastrointestinal: [No change in bowel habit. No bloating or gas. No reflux or indigestion Genitourinary: Negative for urgency, frequency, flank pain and difficulty urinating.  Musculoskeletal: Negative for myalgias, back pain, arthralgias and gait problem.  Neurological: Negative for dizziness, tremors, weakness.  Hematological: Negative for adenopathy.  Psychiatric/Behavioral: Negative for behavioral problems and dysphoric mood.      Objective:   Physical Exam Overweight white male in discomfort but no acute distress.   HEENT - [place]/at, C&S clear COR- RRR Neuro-A&O x 3 CN II-XII nonfocal. MS normal.   Assessment & Plan:  1. Headache - appears most consistant with muscle tension type headache vs sinus. If he gets relief with ketorolac injection will provide "sprix" nasal. Did not get relief with ketorolac. Gave injections of demerol 75mg  and phenergan 25mg  x 2 with only partial relief. He was feeling well enough to go home.  Plan - Rx for demerol 50mg  1-2 q4 prn            Neuro referral - Dr. Vela Prose - has appointment for June but is on the cancellation list -  was able to secure an appointment for Thurs. April 26th.

## 2010-05-14 ENCOUNTER — Ambulatory Visit: Payer: BC Managed Care – PPO | Admitting: Internal Medicine

## 2010-06-05 NOTE — Op Note (Signed)
Collin Holt, Collin Holt                ACCOUNT NO.:  [id number]   MEDICAL RECORD NO.:  [id number]          PATIENT TYPE:  AMB   LOCATION:  [facility]                          FACILITY:  [facility]   PHYSICIAN:  Lubertha Basque. Dalldorf, M.D.DATE OF BIRTH:  1960/02/07   DATE OF PROCEDURE:  04/27/2005  DATE OF DISCHARGE:                                 OPERATIVE REPORT   PREOP DIAGNOSIS:  1.  Right shoulder anterior labral tear.  2.  Right shoulder degeneration.   POSTOPERATIVE DIAGNOSIS:  1.  Right shoulder anterior labral tear.  2.  Right shoulder degeneration.   PROCEDURE:  1.  Right shoulder arthroscopic labral repair.  2.  Right shoulder arthroscopic chondroplasty and debridement.   ANESTHESIA:  General en bloc.   ATTENDING SURGEON:  Lubertha Basque. Jerl Santos, M.D.   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  He felt a pop and had immediate pain.  He has persisted  with pain and apprehension in a position of abduction and external rotation  consistent with anterior instability.  He feels his shoulder shifting and  catching.  He has undergone an MRI arthrogram which shows both anterior and  posterior labral detachments.  He is offered repair.  Informed operative  consent was obtained after discussion about complications of reaction to  anesthesia, infection, neurovascular injury, and recurrence.  He is also  noted to have some irregularities of the articular cartilage; and it is felt  that he is probably developing some degenerative fraying related to this  subluxation.   DESCRIPTION OF PROCEDURE:  The patient went to the operating suite where  general anesthetic was applied without difficulty.  He was also given a  block in the preanesthesia area.  He was positioned in the beach-chair  position and prepped and draped in normal sterile fashion.  After the  administration of IV  Kefzol an arthroscopy of the right shoulder was  performed through a total 3 portals.  The glenohumeral joint did show some  degeneration on the anteroinferior corner consistent with several  subluxation episodes.  There was a small area, devoid of cartilage, and  several flaps of cartilage consistent with some grade 3 change.  A  chondroplasty was done here.  Again, this affected a small area of the  glenoid, perhaps 5%.  He did have frank labral detachment here on the  anteroinferior portion.  There was obvious separation between the labrum and  the underlying glenoid.  The posterior labral tear could not really be seen;  and I got a good view of the entire posterior labrum.  I probed this and  could not displace any portion of this structure.  The superior labrum and  biceps and biceps anchor appeared benign as did the biceps tendon through  the entire shoulder joint.  The rotator cuff appeared benign from below.  I  used a shaver to remove the large flaps of articular cartilage in the small  area on the anteroinferior aspect  of the glenoid.  He was left with a small  area of exposed bone.  I used the elevator and shaver to prepare the  anterior glenoid; and the area for our intended repair which was on the  inferior aspect of the anterior portion of this bone.  We then used the  suture lasso and passed a simple suture through the labrum on the lowest  anterior portion.  I then used a push lock to secure this labral tissue back  to the glenoid.  I then used a second fiber wire suture passed through the  labrum in simple fashion and used a second push lock to secure this in a  slightly more superior location along the anterior aspect of the glenoid.  Our repair was below the midportion of the glenoid, along the anterior  aspect with both of these suture anchors.  This seemed to repair the labrum  well.  The shoulder was thoroughly irrigated.  The suture tags were cut  inside the shoulder  with the appropriate device.  The arthroscopic equipment  was removed.  Simple sutures of nylon were used to loosely reapproximate the  portals followed by Adaptic and dry gauze dressing with tape.  Estimated  blood loss and intraoperative fluids can be obtained from anesthesia  records.   DISPOSITION:  The patient was extubated in the operating room and taken to  recovery room in stable addition.  Plans were for him to go home the same  day and followup in the office in less than 1 week.  I will contact him by  phone tonight.      Lubertha Basque Jerl Santos, M.D.  Electronically Signed     PGD/MEDQ  D:  04/27/2005  T:  04/27/2005  Job:  [id number]

## 2010-06-17 ENCOUNTER — Ambulatory Visit (HOSPITAL_BASED_OUTPATIENT_CLINIC_OR_DEPARTMENT_OTHER): Payer: BC Managed Care – PPO | Attending: Neurology

## 2010-06-17 DIAGNOSIS — I491 Atrial premature depolarization: Secondary | ICD-10-CM | POA: Insufficient documentation

## 2010-06-17 DIAGNOSIS — Z7982 Long term (current) use of aspirin: Secondary | ICD-10-CM | POA: Insufficient documentation

## 2010-06-17 DIAGNOSIS — Z79899 Other long term (current) drug therapy: Secondary | ICD-10-CM | POA: Insufficient documentation

## 2010-06-17 DIAGNOSIS — G4733 Obstructive sleep apnea (adult) (pediatric): Secondary | ICD-10-CM | POA: Insufficient documentation

## 2010-06-20 DIAGNOSIS — G4733 Obstructive sleep apnea (adult) (pediatric): Secondary | ICD-10-CM

## 2010-06-20 DIAGNOSIS — G4761 Periodic limb movement disorder: Secondary | ICD-10-CM

## 2010-06-20 NOTE — Procedures (Signed)
Collin Holt, Collin Holt                ACCOUNT NO.:  [id number]  MEDICAL RECORD NO.:  [id number]          PATIENT TYPE:  OUT  LOCATION:  SLEEP CENTER                 FACILITY:  [facility]  PHYSICIAN:  Clementina Mareno D. Maple Hudson, MD, FCCP, FACPDATE OF BIRTH:  01/09/62  DATE OF STUDY:  06/17/2010                           NOCTURNAL POLYSOMNOGRAM  REFERRING PHYSICIAN:  ELIOT J LEWIT  INDICATION FOR STUDY:  Hypersomnia with sleep apnea.  EPWORTH SLEEPINESS SCORE:  2/24, BMI 36.6.  Weight 270 pounds, height 72 inches.  Neck 17 inches.  MEDICATIONS:  Home medications are charted and reviewed.  SLEEP ARCHITECTURE:  Split-study protocol.  During the diagnostic phase, total sleep time 159 minutes with sleep efficiency 80.1%.  Stage I was 11.3%, stage II 70.1%, stage III 0.3%, REM 18.2% of total sleep time. Sleep latency 25 minutes, REM latency 142.5 minutes, arousal index 55.8.  BEDTIME MEDICATION:  Effexor, aspirin, Flomax.  RESPIRATORY DATA:  Split-study protocol.  Apnea-hypopnea index (AHI) 57.7 per hour.  A total of 153 events was scored including 25 obstructive apneas, 1 central apnea, 1 mixed apnea, 126 hypopneas.  The events were not positional.  REM/AHI 64.1 per hour.  CPAP was then titrated to 15 CWP, AHI 0.9 per hour.  He wore a large ResMed Mirage Quattro full-face mask with heated humidifier and a C-Flex setting of 3.  OXYGEN DATA:  Loud snoring with oxygen desaturation to a nadir of 79% on room air before CPAP.  With CPAP titration, mean oxygen saturation held 95.5% on room air and snoring was prevented.  CARDIAC DATA:  Sinus rhythm with occasional PAC.  MOVEMENT-PARASOMNIA:  Frequent limb jerks with arousal.  During the diagnostic phase, a total of 96 limb jerks were associated with 18 arousals.  During the titration phase, a total of 132 limb jerks were associated with 2 arousals.  No bathroom trips.  IMPRESSIONS-RECOMMENDATIONS: 1. Severe obstructive sleep apnea/hypopnea syndrome,  AHI 57.7 per hour     with non positional events, loud snoring and oxygen desaturation to     a nadir of 79% on room air. 2. Successful CPAP titration to 15 CWP, AHI 0.9 per hour.  He wore a     large ResMed Mirage Quattro full-face mask with heated humidifier     and a C-Flex setting of 3.  The snoring was prevented and     oxygenation normalized with CPAP. 3. Periodic limb movement with arousal.  A total of 96 limb jerks     before CPAP associated with 18 arousals for a periodic limb     movement with arousal index of 6.8 per hour.  During CPAP     titration, a total of 132 limb jerks were counted with only 2     identified arousals for a periodic limb movement with arousal index     of 0.6 per hour.  If limb movement disturbs sleep after he has had     time to adjust to CPAP in the home environment, then specific     therapy such as Requip or Mirapex might be a consideration if     clinically appropriate.     [name], MD, FCCP,  Jerrel Ivory Diplomate, Biomedical engineer of Sleep Medicine Electronically Signed    CDY/MEDQ  D:  06/20/2010 10:52:53  T:  06/20/2010 11:32:26  Job:  [id number]

## 2010-07-31 ENCOUNTER — Ambulatory Visit (INDEPENDENT_AMBULATORY_CARE_PROVIDER_SITE_OTHER): Payer: BC Managed Care – PPO | Admitting: Internal Medicine

## 2010-07-31 ENCOUNTER — Encounter: Payer: Self-pay | Admitting: Internal Medicine

## 2010-07-31 VITALS — BP 108/76 | HR 74 | Ht 72.0 in | Wt 282.6 lb

## 2010-07-31 DIAGNOSIS — G4733 Obstructive sleep apnea (adult) (pediatric): Secondary | ICD-10-CM

## 2010-07-31 MED ORDER — TEMAZEPAM 15 MG PO CAPS: ORAL_CAPSULE | ORAL | Status: DC

## 2010-07-31 NOTE — Progress Notes (Signed)
Subjective:    Patient ID: Collin Holt, male    DOB: 28-Mar-1960, 50 y.o.   MRN: [id number]  HPI 43 yoM former smoker, seen at kind request of Dr Clarisse Gouge for sleep medicine consultation.           wife Eunice Blase here They describe long-time snoring with witnessed apneas. She kicks him to get him breathing. He prefers to sleep on his stomach. His sleep seems restless to her.  Sleep questionnaire reviewed. He reports 30 lb weight gain in last few years. Past hx septoplasty and repair old nasal fx. Out of work truck driver, currently able to nap but admits he can get drowsy pretty fast if he is sitting and unoccupied.  Complicated by hx of AFib, dyspnea, chest pain, sinusitis and migraine. NPSG 06/17/10 AHI 57.7/ hr, PLMS 38.1  Titrated to 15   Review of Systems Constitutional:   No-   weight loss, night sweats, fevers, chills, fatigue, lassitude. HEENT:   No-    difficulty swallowing, tooth/dental problems, sore throat,                  No-   sneezing, itching, ear ache, nasal congestion, post nasal drip,   CV:  No-   chest pain, orthopnea, PND, swelling in lower extremities, anasarca, dizziness,            Some hx of  palpitations  GI:  No-   heartburn, indigestion, abdominal pain, nausea, vomiting, diarrhea,                 change in bowel habits, loss of appetite  Resp: No-   shortness of breath with exertion or at rest.  No-  excess mucus,             No-   productive cough,  No non-productive cough,  No-  coughing up of blood.              No-   change in color of mucus.  No- wheezing.    Skin: No-   rash or lesions.  GU: No-   dysuria, change in color of urine, no urgency or frequency.  No- flank pain.  MS:  No-   joint pain or swelling.  No- decreased range of motion.  No- back pain.  Psych:  No- change in mood or affect. No depression or anxiety.  No memory loss.      Objective:   Physical Exam General- Alert, Oriented, Affect-appropriate, Distress- none acute.        overweight,  quietly attentive     Skin- rash-none, lesions- none, excoriation- none Lymphadenopathy- none Head- atraumatic            Eyes- Gross vision intact, PERRLA, conjunctivae clear secretions            Ears- Hearing, canals normal            Nose- Clear, Mild Septal dev,    No-  mucus, polyps, erosion, perforation             Throat- Mallampati III , mucosa clear , drainage- none, tonsils- atrophic Neck- flexible , trachea midline, no stridor , thyroid nl, carotid no bruit Chest - symmetrical excursion , unlabored           Heart/CV- RRR , no murmur , no gallop  , no rub, nl s1 s2                           -  JVD- none , edema- none, stasis changes- none, varices- none           Lung- clear to P&A, wheeze- none, cough- none , dullness-none, rub- none           Chest wall-  Abd- tender-no, distended-no, bowel sounds-present, HSM- no Br/ Gen/ Rectal- Not done, not indicated Extrem- cyanosis- none, clubbing, none, atrophy- none, strength- nl Neuro- grossly intact to observation         Assessment & Plan:

## 2010-07-31 NOTE — Patient Instructions (Signed)
Order- PCC-  New CPAP 15, mask of choice, heated humidifier   Dx OSA  Script temazepam for sleep

## 2010-08-03 NOTE — Assessment & Plan Note (Signed)
We reviewed physiology, medical concerns and treatments for sleep apnea. Discussed sleep hygiene and limb movement disturbance.  He wishes to start with CPAP. I am concerned by his preference for sleeping on stomach, and aversion to having anything on his face. We will see if he can adapt to a home trial of CPAP starting at 15.

## 2010-08-14 ENCOUNTER — Telehealth: Payer: Self-pay | Admitting: Internal Medicine

## 2010-08-14 DIAGNOSIS — G4733 Obstructive sleep apnea (adult) (pediatric): Secondary | ICD-10-CM

## 2010-08-14 NOTE — Telephone Encounter (Signed)
Per CY-reduce CPAP to 13 cwp; order placed for this. Left message at home number that message has been taken care of.

## 2010-08-14 NOTE — Telephone Encounter (Signed)
LMOMTCB x 1 

## 2010-08-14 NOTE — Telephone Encounter (Signed)
Pt's wife returned call.  She thinks that pressure on patient's cpap is too high b/c pt is "exhausted if he wears the cpap all night."  She states that pt only wore cpap until 2am last night and felt fine this morning.  Feels the cpap is providing "too much air."  Requesting to hear about this today.  Will forward to CDY.

## 2010-09-03 ENCOUNTER — Other Ambulatory Visit: Payer: Self-pay | Admitting: Neurology

## 2010-09-04 ENCOUNTER — Ambulatory Visit
Admission: RE | Admit: 2010-09-04 | Discharge: 2010-09-04 | Disposition: A | Discharge status: Home / Self Care | Payer: BC Managed Care – PPO | Source: Ambulatory Visit | Attending: Neurology | Admitting: Neurology

## 2010-09-04 ENCOUNTER — Other Ambulatory Visit: Payer: Self-pay | Admitting: Neurology

## 2010-09-04 MED ORDER — DIAZEPAM 2 MG PO TABS
10.0000 mg | ORAL_TABLET | Freq: Once | ORAL | Status: AC
  Administered 2010-09-04: 10 mg via ORAL

## 2010-09-05 LAB — INDIA INK CSF: India Ink CSF: NONE SEEN

## 2010-09-07 LAB — CSF CULTURE W GRAM STAIN: Organism ID, Bacteria: NO GROWTH

## 2010-09-09 ENCOUNTER — Other Ambulatory Visit: Payer: Self-pay | Admitting: Neurology

## 2010-09-10 ENCOUNTER — Ambulatory Visit
Admission: RE | Admit: 2010-09-10 | Discharge: 2010-09-10 | Disposition: A | Discharge status: Home / Self Care | Payer: BC Managed Care – PPO | Source: Ambulatory Visit | Attending: Neurology | Admitting: Neurology

## 2010-09-10 ENCOUNTER — Telehealth: Payer: Self-pay

## 2010-09-10 DIAGNOSIS — R519 Headache, unspecified: Secondary | ICD-10-CM

## 2010-09-10 MED ORDER — GADOBENATE DIMEGLUMINE 529 MG/ML IV SOLN
20.0000 mL | Freq: Once | INTRAVENOUS | Status: AC | PRN
  Administered 2010-09-10: 20 mL via INTRAVENOUS

## 2010-09-10 NOTE — Telephone Encounter (Signed)
Wife called requesting copies of out of work letters given by Dr Debby Bud

## 2010-09-11 ENCOUNTER — Encounter: Payer: Self-pay | Admitting: Internal Medicine

## 2010-09-11 ENCOUNTER — Ambulatory Visit (INDEPENDENT_AMBULATORY_CARE_PROVIDER_SITE_OTHER): Payer: BC Managed Care – PPO | Admitting: Internal Medicine

## 2010-09-11 VITALS — BP 138/90 | HR 63 | Ht 72.0 in | Wt 284.8 lb

## 2010-09-11 DIAGNOSIS — G4733 Obstructive sleep apnea (adult) (pediatric): Secondary | ICD-10-CM

## 2010-09-11 NOTE — Progress Notes (Signed)
Subjective:    Patient ID: Collin Holt, male    DOB: 1960/11/14, 50 y.o.   MRN: [id number]  HPI    Review of Systems     Objective:   Physical Exam        Assessment & Plan:   Subjective:    Patient ID: Collin Holt, male    DOB: September 26, 1960, 50 y.o.   MRN: [id number]  HPI 63 yoM former smoker, seen at kind request of Dr Clarisse Gouge for sleep medicine consultation.           wife Collin Holt here They describe long-time snoring with witnessed apneas. She kicks him to get him breathing. He prefers to sleep on his stomach. His sleep seems restless to her.  Sleep questionnaire reviewed. He reports 30 lb weight gain in last few years. Past hx septoplasty and repair old nasal fx. Out of work truck driver, currently able to nap but admits he can get drowsy pretty fast if he is sitting and unoccupied.  Complicated by hx of AFib, dyspnea, chest pain, sinusitis and migraine. NPSG 06/17/10 AHI 57.7/ hr, PLMS 38.1  Titrated to 15   09/11/10- 50 yoM followed for OSA, complicated by hypertension, history of atrial fibrillation, history of sinusitis Returns for f/u after starting CPAP 15 / [facility].  Compliance download 8/ 21/ 2012 confirms good compliance- used over 4 hours, with excellent control- AHI now 1.3/hr. He is now used to the pressure. Full face mask does leak a little. He still doesn't see improvement in his chronic headaches. Being evaluated for NPH by Dr Collin Holt.  We discussed value to treating OSA even if it doesn't correct his headaches.    Review of Systems Constitutional:   No-   weight loss, night sweats, fevers, chills, fatigue, lassitude. HEENT:   No-    difficulty swallowing, tooth/dental problems, sore throat,                  No-   sneezing, itching, ear ache, nasal congestion, post nasal drip,  CV:  No-   chest pain, orthopnea, PND, swelling in lower extremities, anasarca, dizziness,  Some hx of  palpitations GI:  No-   heartburn, indigestion, abdominal pain, nausea,  vomiting, diarrhea,                 change in bowel habits, loss of appetite Resp: No-   shortness of breath with exertion or at rest.  No-  excess mucus,             No-   productive cough,  No non-productive cough,  No-  coughing up of blood.              No-   change in color of mucus.  No- wheezing.   Skin: No-   rash or lesions. GU: No-   dysuria, change in color of urine, no urgency or frequency.  No- flank pain. MS:  No-   joint pain or swelling.  No- decreased range of motion.  No- back pain. Psych:  No- change in mood or affect. No depression or anxiety.  No memory loss.      Objective:   Physical Exam General- Alert, Oriented, Affect-appropriate, Distress- none acute.        overweight, quietly attentive    Yawned once Skin- rash-none, lesions- none, excoriation- none Lymphadenopathy- none Head- atraumatic            Eyes- Gross vision intact, PERRLA, conjunctivae clear secretions  Ears- Hearing, canals normal            Nose- Clear, +Mild Septal dev,    No-  mucus, polyps, erosion, perforation             Throat- Mallampati III , mucosa clear , drainage- none, tonsils- atrophic Neck- flexible , trachea midline, no stridor , thyroid nl, carotid no bruit Chest - symmetrical excursion , unlabored           Heart/CV- RRR , no murmur , no gallop  , no rub, nl s1 s2                           - JVD- none , edema- none, stasis changes- none, varices- none           Lung- clear to P&A, wheeze- none, cough- none , dullness-none, rub- none           Chest wall-  Abd- tender-no, distended-no, bowel sounds-present, HSM- no Br/ Gen/ Rectal- Not done, not indicated Extrem- cyanosis- none, clubbing, none, atrophy- none, strength- nl Neuro- grossly intact to observation         Assessment & Plan:

## 2010-09-11 NOTE — Assessment & Plan Note (Addendum)
Good compliance and control. I expect him to settle in and do well over time. Will continue at 15 cwp.

## 2010-09-11 NOTE — Patient Instructions (Signed)
Good start on CPAP, with good control on 15. Our goal will be "all night-every night". Please let the home care company know about any discomfort or problems with the machine and mask. Please call me as needed.

## 2011-01-14 ENCOUNTER — Ambulatory Visit: Payer: BC Managed Care – PPO | Admitting: Internal Medicine

## 2011-01-25 ENCOUNTER — Ambulatory Visit (INDEPENDENT_AMBULATORY_CARE_PROVIDER_SITE_OTHER): Payer: BC Managed Care – PPO | Admitting: *Deleted

## 2011-01-25 ENCOUNTER — Encounter: Payer: Self-pay | Admitting: *Deleted

## 2011-01-25 VITALS — BP 118/85 | HR 80 | Ht 72.0 in | Wt 292.0 lb

## 2011-01-25 DIAGNOSIS — I10 Essential (primary) hypertension: Secondary | ICD-10-CM

## 2011-01-25 NOTE — Progress Notes (Signed)
Pt came to office for BP check. He has had problems with frequent migraines, seeing neurologist. Has had spinal tap x 1 with elevated findings and since then has c/o dizziness and thinks BP running high after checking with wrist monitor. BP today 118/85 HR 80. Pt has h/o a fib, HTN, seen previously 12/2009 for CP and SOB. Pt has not f/u since, so today he has scheduled f/u with Dr. Mariah Milling. Pt is going to buy BP monitor and check at home, will call office with any changes or symptoms.

## 2011-01-25 NOTE — Patient Instructions (Signed)
Scheduled appt with Dr. Mariah Milling first available for routine follow up:   Please buy BP cuff and monitor, and call office with any abnormal findings.

## 2011-02-10 ENCOUNTER — Ambulatory Visit: Payer: BC Managed Care – PPO | Admitting: Cardiovascular Disease

## 2011-04-13 DIAGNOSIS — G43109 Migraine with aura, not intractable, without status migrainosus: Secondary | ICD-10-CM | POA: Insufficient documentation

## 2011-04-13 DIAGNOSIS — F329 Major depressive disorder, single episode, unspecified: Secondary | ICD-10-CM | POA: Insufficient documentation

## 2011-05-25 DIAGNOSIS — R42 Dizziness and giddiness: Secondary | ICD-10-CM | POA: Insufficient documentation

## 2011-09-11 ENCOUNTER — Emergency Department: Payer: Self-pay | Admitting: Emergency Medicine

## 2011-09-11 LAB — COMPREHENSIVE METABOLIC PANEL
Anion Gap: 7 (ref 7–16)
Calcium, Total: 8.8 mg/dL (ref 8.5–10.1)
Chloride: 102 mmol/L (ref 98–107)
EGFR (African American): >60
Potassium: 4 mmol/L (ref 3.5–5.1)
SGOT(AST): 52 U/L — ABNORMAL HIGH (ref 15–37)
Total Protein: 7.8 g/dL (ref 6.4–8.2)

## 2011-09-11 LAB — CSF CELL CT + PROT + GLU PANEL
Neutrophils: 0 %
Other Cells: 0 %
Protein, CSF: 61 mg/dL — ABNORMAL HIGH (ref 15–45)

## 2011-09-11 LAB — CBC
HGB: 17.1 g/dL (ref 13.0–18.0)
MCH: 32.1 pg (ref 26.0–34.0)
MCHC: 35.2 g/dL (ref 32.0–36.0)
Platelet: 167 10*3/uL (ref 150–440)
RBC: 5.35 10*6/uL (ref 4.40–5.90)

## 2011-09-11 LAB — URINALYSIS, COMPLETE
Bacteria: NONE SEEN
Hyaline Cast: 1
Ketone: NEGATIVE
Nitrite: NEGATIVE
Protein: 30
Specific Gravity: 1.026 (ref 1.003–1.030)
WBC UR: 3 /HPF (ref 0–5)

## 2011-09-11 LAB — CSF CELL COUNT WITH DIFFERENTIAL
Eosinophil: 0 %
Lymphocytes: 100 %
Monocytes/Macrophages: 0 %
Neutrophils: 0 %
WBC (CSF): 10 /mm3

## 2011-09-14 LAB — CSF CULTURE W GRAM STAIN

## 2011-09-17 LAB — CULTURE, BLOOD (SINGLE)

## 2012-12-18 ENCOUNTER — Ambulatory Visit: Payer: Self-pay | Admitting: Neurology

## 2013-07-19 ENCOUNTER — Inpatient Hospital Stay: Payer: Self-pay | Admitting: Internal Medicine

## 2013-07-19 ENCOUNTER — Encounter: Payer: Self-pay | Admitting: Nurse Practitioner

## 2013-07-19 DIAGNOSIS — I10 Essential (primary) hypertension: Secondary | ICD-10-CM | POA: Diagnosis not present

## 2013-07-19 DIAGNOSIS — I517 Cardiomegaly: Secondary | ICD-10-CM | POA: Diagnosis not present

## 2013-07-19 DIAGNOSIS — F1010 Alcohol abuse, uncomplicated: Secondary | ICD-10-CM

## 2013-07-19 DIAGNOSIS — E876 Hypokalemia: Secondary | ICD-10-CM | POA: Diagnosis not present

## 2013-07-19 DIAGNOSIS — I4891 Unspecified atrial fibrillation: Secondary | ICD-10-CM

## 2013-07-19 LAB — CK TOTAL AND CKMB (NOT AT ARMC)
CK, TOTAL: 310 U/L — AB
CK, TOTAL: 255 U/L
CK, Total: 209 U/L
CK-MB: 3.6 ng/mL (ref 0.5–3.6)
CK-MB: 3.2 ng/mL (ref 0.5–3.6)
CK-MB: 2.9 ng/mL (ref 0.5–3.6)

## 2013-07-19 LAB — CBC
HCT: 49.9 % (ref 40.0–52.0)
HGB: 17 g/dL (ref 13.0–18.0)
MCH: 31.4 pg (ref 26.0–34.0)
MCHC: 34.1 g/dL (ref 32.0–36.0)
MCV: 92 fL (ref 80–100)
Platelet: 216 10*3/uL (ref 150–440)
RBC: 5.43 10*6/uL (ref 4.40–5.90)
RDW: 12.9 % (ref 11.5–14.5)
WBC: 7.1 10*3/uL (ref 3.8–10.6)

## 2013-07-19 LAB — COMPREHENSIVE METABOLIC PANEL
ALBUMIN: 3.7 g/dL (ref 3.4–5.0)
ALT: 32 U/L (ref 12–78)
Alkaline Phosphatase: 65 U/L
Anion Gap: 11 (ref 7–16)
BILIRUBIN TOTAL: 1.1 mg/dL — AB (ref 0.2–1.0)
BUN: 11 mg/dL (ref 7–18)
CO2: 22 mmol/L (ref 21–32)
CREATININE: 0.94 mg/dL (ref 0.60–1.30)
Calcium, Total: 8.6 mg/dL (ref 8.5–10.1)
Chloride: 106 mmol/L (ref 98–107)
Glucose: 208 mg/dL — ABNORMAL HIGH (ref 65–99)
OSMOLALITY: 283 (ref 275–301)
POTASSIUM: 3.6 mmol/L (ref 3.5–5.1)
SGOT(AST): 29 U/L (ref 15–37)
Sodium: 139 mmol/L (ref 136–145)
Total Protein: 7.4 g/dL (ref 6.4–8.2)

## 2013-07-19 LAB — TROPONIN I: Troponin-I: <0.02 ng/mL

## 2013-07-19 LAB — TSH: Thyroid Stimulating Horm: 3.1 u[IU]/mL

## 2013-07-19 LAB — MAGNESIUM: MAGNESIUM: 1.9 mg/dL

## 2013-07-19 LAB — HEMOGLOBIN A1C: HEMOGLOBIN A1C: 5.6 % (ref 4.2–6.3)

## 2013-07-20 DIAGNOSIS — E876 Hypokalemia: Secondary | ICD-10-CM | POA: Diagnosis not present

## 2013-07-20 DIAGNOSIS — I4891 Unspecified atrial fibrillation: Secondary | ICD-10-CM | POA: Diagnosis not present

## 2013-07-20 LAB — BASIC METABOLIC PANEL
Anion Gap: 8 (ref 7–16)
BUN: 11 mg/dL (ref 7–18)
CO2: 26 mmol/L (ref 21–32)
CREATININE: 0.95 mg/dL (ref 0.60–1.30)
Calcium, Total: 9 mg/dL (ref 8.5–10.1)
Chloride: 105 mmol/L (ref 98–107)
EGFR (African American): >60
EGFR (Non-African Amer.): >60
Glucose: 104 mg/dL — ABNORMAL HIGH (ref 65–99)
OSMOLALITY: 277 (ref 275–301)
Potassium: 4.2 mmol/L (ref 3.5–5.1)
SODIUM: 139 mmol/L (ref 136–145)

## 2013-07-23 ENCOUNTER — Encounter: Payer: Self-pay | Admitting: Cardiovascular Disease

## 2013-07-23 ENCOUNTER — Ambulatory Visit (INDEPENDENT_AMBULATORY_CARE_PROVIDER_SITE_OTHER): Payer: Medicare PPO | Admitting: Cardiovascular Disease

## 2013-07-23 VITALS — BP 100/80 | HR 54 | Ht 72.0 in | Wt 263.2 lb

## 2013-07-23 DIAGNOSIS — I4891 Unspecified atrial fibrillation: Secondary | ICD-10-CM

## 2013-07-23 DIAGNOSIS — I10 Essential (primary) hypertension: Secondary | ICD-10-CM

## 2013-07-23 DIAGNOSIS — G4733 Obstructive sleep apnea (adult) (pediatric): Secondary | ICD-10-CM

## 2013-07-23 NOTE — Progress Notes (Signed)
Patient ID: Collin Holt, male    DOB: [birth year], 53 y.o.   MRN: [id number]  HPI Comments: Collin Holt is a 53 year old gentleman with obesity, obstructive sleep apnea, paroxysmal atrial fibrillation, long history of vertigo, periodic alcohol use who is on disability for headaches and severe vertigo, presenting for followup after recent hospital admission for atrial fibrillation. Prior cardiac catheterization every 2012 showing no significant CAD Prior echocardiogram 01/2010 showing normal ejection fraction  In followup today, he reports that he feels well. He reports that shortly after discharge, possibly that evening, he went back to normal sinus rhythm. He was given amiodarone while in the hospital. Discharge medications are not clear. Discharge notes indicate he was sent home on eliquis twice a day with amiodarone BID. He reports that he has eliquis and another medicine that begins with am. We called [facility] and they do not have amiodarone on his list. He thinks that he is on metoprolol.  In general he feels well with no complaints. He would like to stay on blood thinners to avoid stroke. He was unable to tolerate CPAP as this may his vertigo worse   EKG shows normal sinus rhythm with rate 54 beats per minute, no significant ST or T wave changes   Outpatient Encounter Prescriptions as of 07/23/2013  Medication Sig  . apixaban (ELIQUIS) 5 MG TABS tablet Take 5 mg by mouth 2 (two) times daily.  . tizanidine (ZANAFLEX) 2 MG capsule Take 2 mg by mouth 3 (three) times daily as needed.   . promethazine (PHENERGAN) 12.5 MG tablet Take 1 tablet (12.5 mg total) by mouth every 6 (six) hours as needed for nausea.  Marland Kitchen venlafaxine (EFFEXOR) 75 MG tablet Take 75 mg by mouth daily.    Possibly on metoprolol 25 mg twice a day. He will call us to confirm   Review of Systems  Constitutional: Negative.   HENT: Negative.   Eyes: Negative.   Respiratory: Negative.   Cardiovascular: Negative.    Gastrointestinal: Negative.   Endocrine: Negative.   Musculoskeletal: Negative.   Skin: Negative.   Allergic/Immunologic: Negative.   Neurological: Positive for dizziness.       Chronic vertigo  Hematological: Negative.   Psychiatric/Behavioral: Negative.   All other systems reviewed and are negative.   BP 100/80  Pulse 54  Ht 6' (1.829 m)  Wt 263 lb 4 oz (119.409 kg)  BMI 35.70 kg/m2  Physical Exam  Nursing note and vitals reviewed. Constitutional: He is oriented to person, place, and time. He appears well-developed and well-nourished.  HENT:  Head: Normocephalic.  Nose: Nose normal.  Mouth/Throat: Oropharynx is clear and moist.  Eyes: Conjunctivae are normal. Pupils are equal, round, and reactive to light.  Neck: Normal range of motion. Neck supple. No JVD present.  Cardiovascular: Normal rate, regular rhythm, S1 normal, S2 normal, normal heart sounds and intact distal pulses.  Exam reveals no gallop and no friction rub.   No murmur heard. Pulmonary/Chest: Effort normal and breath sounds normal. No respiratory distress. He has no wheezes. He has no rales. He exhibits no tenderness.  Abdominal: Soft. Bowel sounds are normal. He exhibits no distension. There is no tenderness.  Musculoskeletal: Normal range of motion. He exhibits no edema and no tenderness.  Lymphadenopathy:    He has no cervical adenopathy.  Neurological: He is alert and oriented to person, place, and time. Coordination normal.  Skin: Skin is warm and dry. No rash noted. No erythema.  Psychiatric: He has a normal  mood and affect. His behavior is normal. Judgment and thought content normal.      Assessment and Plan

## 2013-07-23 NOTE — Assessment & Plan Note (Signed)
Reports unable to tolerate CPAP as this may his vertigo worse. This is a risk factor for continued arrhythmia.

## 2013-07-23 NOTE — Assessment & Plan Note (Signed)
Paroxysmal atrial fibrillation. Recent hospital admission 07/19/2013. Started on amiodarone, converting relatively quickly to normal sinus rhythm. He did not bring his medication list with him. Discharge instructions do not appear to be accurate as he's not taking amiodarone as an outpatient. Possibly on metoprolol. He will call us with the medication list. We'll continue eliquis 5 g twice a day with his other medications, presumably metoprolol 25 mg twice a day. Recommended he closely monitor his pulse and call our office for irregular rhythm, tachycardia. He is high risk of recurrent arrhythmia given underlying sleep apnea, alcohol intake.

## 2013-07-23 NOTE — Patient Instructions (Signed)
You are doing well. You are in normal rhythm  Please continue to take eliquis twice a day Please continue to take metoprolol twice a day (If it is amiodarone, call the office)  Please call the office if your rhythm goes back into atrial fibrillation  Please call us if you have new issues that need to be addressed before your next appt.  Your physician wants you to follow-up in: 6 months.  You will receive a reminder letter in the mail two months in advance. If you don't receive a letter, please call our office to schedule the follow-up appointment.

## 2013-07-23 NOTE — Assessment & Plan Note (Signed)
Blood pressure is well controlled on today's visit. No changes made to the medications. 

## 2013-07-26 ENCOUNTER — Telehealth: Payer: Self-pay

## 2013-07-26 MED ORDER — METOPROLOL TARTRATE 50 MG PO TABS: 50.0000 mg | ORAL_TABLET | Freq: Two times a day (BID) | ORAL | Status: DC

## 2013-07-26 NOTE — Telephone Encounter (Signed)
Spoke w/ pt's wife.  She clarified that pt is taking metoprolol 50mg  BID.  She reports that pt had a near syncopal episode yesterday, HR was 50.  Pt feels better today, is shopping w/ wife at [facility]. Pt does not have BP monitor.  Advised her to purchase one today and monitor pt's BP daily, as well as when he is symptomatic.  She states that pt was drinking quite a bit of beer before this episode, which he does often.  Advised her to urge pt to drink water and cut back on or eliminate the beer, but she states that she can only encourage him to increase his water.  She will call back w/ BP readings or if pt becomes symptomatic again.

## 2013-07-26 NOTE — Telephone Encounter (Signed)
Pt wife called and has a question regarding Eliquis and Toprol, states pt is extremely dizzy and HR is 50. Please call.

## 2013-08-08 ENCOUNTER — Telehealth: Payer: Self-pay

## 2013-08-08 ENCOUNTER — Encounter: Payer: Self-pay | Admitting: Cardiovascular Disease

## 2013-08-08 NOTE — Telephone Encounter (Signed)
Pt wife reporting BP readings: 110/71, HR is 49, states pt is extremely tired.

## 2013-08-08 NOTE — Telephone Encounter (Signed)
Left message for pt or wife to call back.

## 2013-08-08 NOTE — Telephone Encounter (Signed)
Spoke w/ pt's wife.  She reports pt's BP this am 110/71 HR 49.  States that his BP and HR have been decreasing and pt feels tired throughout the day.  She would like to know if his metoprolol could be cut back. Please advise.  Thank you!

## 2013-08-08 NOTE — Telephone Encounter (Signed)
I believe he is taking metoprolol 25 mg twice a day He can cut this in half twice a day

## 2013-08-08 NOTE — Telephone Encounter (Signed)
Left detailed message on voicemails for both numbers.  Asked [name] to call w/ any questions or concerns.

## 2013-09-10 ENCOUNTER — Telehealth: Payer: Self-pay

## 2013-09-10 NOTE — Telephone Encounter (Signed)
Left message for pt to call back regarding completion of pt assistance forms for Eliquis.

## 2013-10-01 ENCOUNTER — Telehealth: Payer: Self-pay

## 2013-10-01 ENCOUNTER — Ambulatory Visit (INDEPENDENT_AMBULATORY_CARE_PROVIDER_SITE_OTHER): Payer: Commercial Managed Care - HMO | Admitting: Cardiovascular Disease

## 2013-10-01 ENCOUNTER — Encounter: Payer: Self-pay | Admitting: Cardiovascular Disease

## 2013-10-01 VITALS — BP 98/64 | HR 69 | Ht 72.0 in | Wt 275.2 lb

## 2013-10-01 DIAGNOSIS — I10 Essential (primary) hypertension: Secondary | ICD-10-CM | POA: Diagnosis not present

## 2013-10-01 DIAGNOSIS — R002 Palpitations: Secondary | ICD-10-CM

## 2013-10-01 DIAGNOSIS — G4733 Obstructive sleep apnea (adult) (pediatric): Secondary | ICD-10-CM | POA: Diagnosis not present

## 2013-10-01 DIAGNOSIS — I4891 Unspecified atrial fibrillation: Secondary | ICD-10-CM | POA: Diagnosis not present

## 2013-10-01 MED ORDER — AMIODARONE HCL 200 MG PO TABS: 200.0000 mg | ORAL_TABLET | Freq: Two times a day (BID) | ORAL | Status: DC | PRN

## 2013-10-01 NOTE — Progress Notes (Signed)
   Patient ID: Collin Holt, male    DOB: 07-26-1960, 53 y.o.   MRN: [id number]  HPI Comments: Collin Holt is a 53 year old gentleman with obesity, obstructive sleep apnea, paroxysmal atrial fibrillation, long history of vertigo, periodic alcohol use who is on disability for headaches and severe vertigo, recently in the hospital July 2015 for atrial fibrillation. He was given amiodarone in the hospital and converted to normal sinus rhythm shortly after discharge. Prior cardiac catheterization every 2012 showing no significant CAD Prior echocardiogram 01/2010 showing normal ejection fraction  He reports that he was going to sleep last night when he fell tachycardia. He was able to sleep and again woke up this morning with heart rate going 150 beats per minute consistent with atrial fibrillation. He took his normal metoprolol. He rested most of the morning and presents for followup in the office this afternoon. In the office he is in normal sinus rhythm.  EKG shows normal sinus rhythm with rate 69 beats per minute, no significant ST or T wave changes   Outpatient Encounter Prescriptions as of 10/01/2013  Medication Sig  . apixaban (ELIQUIS) 5 MG TABS tablet Take 5 mg by mouth 2 (two) times daily.  . metoprolol (LOPRESSOR) 50 MG tablet Take 1 tablet (50 mg total) by mouth 2 (two) times daily.  Marland Kitchen venlafaxine (EFFEXOR) 75 MG tablet Take 75 mg by mouth daily.     Review of Systems  Constitutional: Negative.   HENT: Negative.   Eyes: Negative.   Respiratory: Positive for shortness of breath.   Cardiovascular: Positive for palpitations.  Gastrointestinal: Negative.   Endocrine: Negative.   Musculoskeletal: Negative.   Skin: Negative.   Allergic/Immunologic: Negative.   Neurological: Positive for dizziness.       Chronic vertigo  Hematological: Negative.   Psychiatric/Behavioral: Negative.   All other systems reviewed and are negative.   BP 98/64  Pulse 69  Ht 6' (1.829 m)  Wt 275 lb 4 oz  (124.853 kg)  BMI 37.32 kg/m2  Physical Exam  Nursing note and vitals reviewed. Constitutional: He is oriented to person, place, and time. He appears well-developed and well-nourished.  HENT:  Head: Normocephalic.  Nose: Nose normal.  Mouth/Throat: Oropharynx is clear and moist.  Eyes: Conjunctivae are normal. Pupils are equal, round, and reactive to light.  Neck: Normal range of motion. Neck supple. No JVD present.  Cardiovascular: Normal rate, regular rhythm, S1 normal, S2 normal, normal heart sounds and intact distal pulses.  Exam reveals no gallop and no friction rub.   No murmur heard. Pulmonary/Chest: Effort normal and breath sounds normal. No respiratory distress. He has no wheezes. He has no rales. He exhibits no tenderness.  Abdominal: Soft. Bowel sounds are normal. He exhibits no distension. There is no tenderness.  Musculoskeletal: Normal range of motion. He exhibits no edema and no tenderness.  Lymphadenopathy:    He has no cervical adenopathy.  Neurological: He is alert and oriented to person, place, and time. Coordination normal.  Skin: Skin is warm and dry. No rash noted. No erythema.  Psychiatric: He has a normal mood and affect. His behavior is normal. Judgment and thought content normal.      Assessment and Plan

## 2013-10-01 NOTE — Telephone Encounter (Signed)
Spoke w/ pt.  He states that his HR is about 100, feels that it will "beat real light about 5 times, then stop for a bit".  He states that he had some lightheadedness last night and had some SOB. He reports that he has not taken his metoprolol this am.  Advised pt to take his am meds and that I would speak w/ Dr. Rockey Situ about how to proceed.

## 2013-10-01 NOTE — Telephone Encounter (Signed)
Spoke w/ pt.  Advised him that I have spoken w/ Dr. Rockey Situ and we are working him in today at 2:45.  He is agreeable to this.

## 2013-10-01 NOTE — Assessment & Plan Note (Signed)
Instructed him to use his CPAP to minimize risk of atrial fibrillation

## 2013-10-01 NOTE — Telephone Encounter (Signed)
Pt called and states he is in afib, states it started last night about 9:00. States his HR is 99-100.

## 2013-10-01 NOTE — Patient Instructions (Addendum)
You are doing well.  If you go into atrial fibrillation, Please take amiodarone 2 pills (400 mg todal) every four hours up to 2 times Call if you do not convert to normal rhythm  Please call us if you have new issues that need to be addressed before your next appt.  Your physician wants you to follow-up in: 6 months.  You will receive a reminder letter in the mail two months in advance. If you don't receive a letter, please call our office to schedule the follow-up appointment.

## 2013-10-01 NOTE — Assessment & Plan Note (Addendum)
I suspect he had a episode of atrial fibrillation starting last night, terminating this morning. Metoprolol does not seem to be holding his rhythms. Likely has sleep apnea, also occasional alcohol. We amiodarone to take as needed for paroxysmal atrial fibrillation. Instructions given to take 400 mg every 4 hours x2 when he has an episode. He will stay on anticoagulation

## 2013-10-01 NOTE — Assessment & Plan Note (Signed)
Blood pressure running on the low side today. He is asymptomatic

## 2013-10-01 NOTE — Telephone Encounter (Signed)
Spoke w/ pt.  He states that he took his metoprolol after we spoke, but that his heart is "beating about the same".  He is wondering if this will "fix itself on it's own" of there is something that can be done over the phone.  Please advise.  Thank you.

## 2013-10-01 NOTE — Assessment & Plan Note (Signed)
Recent palpitations suggestive of atrial fibrillation as detailed above

## 2013-10-12 ENCOUNTER — Telehealth: Payer: Self-pay

## 2013-10-12 NOTE — Telephone Encounter (Signed)
Spoke w/ pt's wife.  Advised her that I spoke w/ Dr. Rockey Situ who recommends that pt seek treatment for his OSA, as complaint is most likely not r/t heart meds.  Had lengthy discussion w/ her about OSA, it's effects on the body and treatment options.  She verbalizes understanding and reports that pt has appt w/ PCP on Mon to discuss.  Asked her to call back if we can be of further assistance.

## 2013-10-12 NOTE — Telephone Encounter (Signed)
Pt wife called, states pt is on Eliquis and ? (she's  not sure,) states pt is having problems having an erection. States he has never had this before. Please call.

## 2013-10-12 NOTE — Telephone Encounter (Signed)
Pt wife called back states pt is also experience dizziness, not sleeping well, very tired, and seems to be more depressed.

## 2013-10-24 ENCOUNTER — Ambulatory Visit: Payer: Self-pay | Admitting: Internal Medicine

## 2014-01-08 ENCOUNTER — Encounter: Payer: Self-pay | Admitting: Cardiovascular Disease

## 2014-01-23 DIAGNOSIS — G4733 Obstructive sleep apnea (adult) (pediatric): Secondary | ICD-10-CM | POA: Diagnosis not present

## 2014-01-23 DIAGNOSIS — G4761 Periodic limb movement disorder: Secondary | ICD-10-CM | POA: Diagnosis not present

## 2014-02-18 DIAGNOSIS — R51 Headache: Secondary | ICD-10-CM | POA: Diagnosis not present

## 2014-02-18 DIAGNOSIS — M47817 Spondylosis without myelopathy or radiculopathy, lumbosacral region: Secondary | ICD-10-CM | POA: Diagnosis not present

## 2014-02-18 DIAGNOSIS — R200 Anesthesia of skin: Secondary | ICD-10-CM | POA: Diagnosis not present

## 2014-02-18 DIAGNOSIS — M5136 Other intervertebral disc degeneration, lumbar region: Secondary | ICD-10-CM | POA: Diagnosis not present

## 2014-02-20 ENCOUNTER — Ambulatory Visit: Payer: Self-pay | Admitting: Internal Medicine

## 2014-02-20 DIAGNOSIS — R531 Weakness: Secondary | ICD-10-CM | POA: Diagnosis not present

## 2014-02-20 DIAGNOSIS — R200 Anesthesia of skin: Secondary | ICD-10-CM | POA: Diagnosis not present

## 2014-02-23 DIAGNOSIS — G4733 Obstructive sleep apnea (adult) (pediatric): Secondary | ICD-10-CM | POA: Diagnosis not present

## 2014-02-23 DIAGNOSIS — G4761 Periodic limb movement disorder: Secondary | ICD-10-CM | POA: Diagnosis not present

## 2014-02-26 ENCOUNTER — Emergency Department: Payer: Self-pay | Admitting: Emergency Medicine

## 2014-02-26 DIAGNOSIS — G43909 Migraine, unspecified, not intractable, without status migrainosus: Secondary | ICD-10-CM | POA: Diagnosis not present

## 2014-02-26 DIAGNOSIS — Z79899 Other long term (current) drug therapy: Secondary | ICD-10-CM | POA: Diagnosis not present

## 2014-02-26 DIAGNOSIS — M21372 Foot drop, left foot: Secondary | ICD-10-CM | POA: Diagnosis not present

## 2014-02-26 DIAGNOSIS — G8929 Other chronic pain: Secondary | ICD-10-CM | POA: Diagnosis not present

## 2014-02-26 DIAGNOSIS — Z7902 Long term (current) use of antithrombotics/antiplatelets: Secondary | ICD-10-CM | POA: Diagnosis not present

## 2014-02-26 DIAGNOSIS — Z7952 Long term (current) use of systemic steroids: Secondary | ICD-10-CM | POA: Diagnosis not present

## 2014-02-26 DIAGNOSIS — M792 Neuralgia and neuritis, unspecified: Secondary | ICD-10-CM | POA: Diagnosis not present

## 2014-03-01 ENCOUNTER — Ambulatory Visit: Payer: Self-pay | Admitting: Internal Medicine

## 2014-03-01 DIAGNOSIS — M1288 Other specific arthropathies, not elsewhere classified, other specified site: Secondary | ICD-10-CM | POA: Diagnosis not present

## 2014-03-01 DIAGNOSIS — M5124 Other intervertebral disc displacement, thoracic region: Secondary | ICD-10-CM | POA: Diagnosis not present

## 2014-03-01 DIAGNOSIS — M5126 Other intervertebral disc displacement, lumbar region: Secondary | ICD-10-CM | POA: Diagnosis not present

## 2014-03-05 DIAGNOSIS — M5136 Other intervertebral disc degeneration, lumbar region: Secondary | ICD-10-CM | POA: Diagnosis not present

## 2014-03-05 DIAGNOSIS — M21372 Foot drop, left foot: Secondary | ICD-10-CM | POA: Diagnosis not present

## 2014-03-14 DIAGNOSIS — G43019 Migraine without aura, intractable, without status migrainosus: Secondary | ICD-10-CM | POA: Diagnosis not present

## 2014-03-14 DIAGNOSIS — G43719 Chronic migraine without aura, intractable, without status migrainosus: Secondary | ICD-10-CM | POA: Diagnosis not present

## 2014-03-16 DIAGNOSIS — G43019 Migraine without aura, intractable, without status migrainosus: Secondary | ICD-10-CM | POA: Diagnosis not present

## 2014-03-16 DIAGNOSIS — M542 Cervicalgia: Secondary | ICD-10-CM | POA: Diagnosis not present

## 2014-03-16 DIAGNOSIS — M791 Myalgia: Secondary | ICD-10-CM | POA: Diagnosis not present

## 2014-03-16 DIAGNOSIS — R51 Headache: Secondary | ICD-10-CM | POA: Diagnosis not present

## 2014-03-16 DIAGNOSIS — G43719 Chronic migraine without aura, intractable, without status migrainosus: Secondary | ICD-10-CM | POA: Diagnosis not present

## 2014-03-24 DIAGNOSIS — G4733 Obstructive sleep apnea (adult) (pediatric): Secondary | ICD-10-CM | POA: Diagnosis not present

## 2014-03-24 DIAGNOSIS — G4761 Periodic limb movement disorder: Secondary | ICD-10-CM | POA: Diagnosis not present

## 2014-04-24 DIAGNOSIS — G4733 Obstructive sleep apnea (adult) (pediatric): Secondary | ICD-10-CM | POA: Diagnosis not present

## 2014-04-24 DIAGNOSIS — G4761 Periodic limb movement disorder: Secondary | ICD-10-CM | POA: Diagnosis not present

## 2014-05-11 NOTE — Consult Note (Signed)
General Aspect 54 y/o male with h/o PAF and HTN who was admitted with PAF. ___________  PMH 1.  PAF      a. 01/2010 Echo: EF 55-65%, normal wall motion, mod dil LA. 2.  HTN 3.  HL 4.  Obesity 5.  OSA on CPAP 6.  DOE      a. 2012 CPX testing showed good fxnl capacity/deconditioning.      b. 02/2010 Cath nl cors, nl LV. 7.  H/O R shoulder surgery x 2 8.  Headaches 9.  Vertigo   Present Illness 54 y/o male with a h/o PAF.  CHA2DS2VASc = 1 (HTN) and has not been anticoagulated.  Last seen in our clinic in 2012.  He had not had any afib since his last visit and does not use any AVN blocking agents @ home.  He has been dx with OSA and is followed closely in sleep clinic by Dr. Gwenette Greet and is compliant with CPAP.  He previously drove a truck for a living but 2/2 headaches and severe vertigo, he has been out on disability.  He was in his USOH until this AM when he awoke @ about 7:45 with tachypalps and mild dyspnea.  He figured that he was in afib and presented to the [facility] ED where he was found to be in afib with RVR with rates in the 160's.  Labs were notable for hyperglycemia, mild hypokalemia, nl Mg, nl TSH.  Trops neg x 2.  ECG with poor R prog and LAD however no acute st/t changes.  In the ED, he was placed on a dilt gtt and after discussion with Dr. Rockey Situ, he has been ordered amiodarone 435m q4h x 3 doses.  He remains in afib with rates in the low 100's to 1-teens.  He is currently asymptomatic.  Of note, since the start of summer, he has been drinking 2-4 beers/day.  He also drinks 4-5 cups of coffee/day.   Physical Exam:  GEN well developed, pleasant, nad.   HEENT pink conjunctivae, moist oral mucosa   NECK supple  No masses  no bruits/jvd.   RESP normal resp effort  clear BS   CARD Irregular rate and rhythm  Tachycardic  Normal, S1, S2  No murmur   ABD denies tenderness  soft  normal BS   LYMPH negative neck   SKIN normal to palpation   NEURO motor/sensory function intact,  grossly intact, nonfocal.   PSYCH alert, A+O to time, place, person, good insight   Review of Systems:  Subjective/Chief Complaint headache, vertigo, palpitations   General: No Complaints   Skin: No Complaints   ENT: Ringing in ears  chronic vertiog and headaches.   Eyes: No Complaints   Neck: No Complaints   Respiratory: Short of breath  in setting of rapid afib.   Cardiovascular: Palpitations  Dyspnea   Gastrointestinal: No Complaints   Genitourinary: No Complaints   Vascular: No Complaints   Musculoskeletal: No Complaints   Neurologic: No Complaints   Hematologic: No Complaints   Endocrine: No Complaints   Psychiatric: No Complaints   Review of Systems: All other systems were reviewed and found to be negative   Medications/Allergies Reviewed Medications/Allergies reviewed   Family & Social History:  Family and Social History:  Family History father died of cancer.  mother alive/well.  no premature cad.   Social History positive  tobacco, Prev smoked cigarettes - quit ~ 15 yrs ago.  Drinks 2-4 beers/day.  4-5 cups of coffee/day.  No drugs.   Place of Living Home  Lives in [place] with wife.  Married, 4 dtrs.  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       Migraines:    a fib:    right shoulder surgery:    Depression:    IRREGULAR HEART BEAT:   Home Medications: Medication Instructions Status  Effexor XR 75 mg oral capsule, extended release 1 cap(s) orally once a day Active  Imitrex 100 mg oral tablet 1 tab(s) orally once a day as needed for headaches. *may take 2nd dose in 2 hours if needed* Active  tiZANidine 2 mg oral tablet 1 tab(s) orally 2 times a day, As Needed limit use to 2 times per week Active   Lab Results:  Thyroid:  02-Jul-15 10:41   Thyroid Stimulating Hormone 3.10 (0.45-4.50 (International Unit)  ----------------------- Pregnant patients have  different reference  ranges for TSH:  - - - - - - - - - -   Pregnant, first trimetser:  0.36 - 2.50 uIU/mL)  Hepatic:  02-Jul-15 10:41   Bilirubin, Total  1.1  Alkaline Phosphatase 65 (45-117 NOTE: New Reference Range 12/08/12)  SGPT (ALT) 32  SGOT (AST) 29  Total Protein, Serum 7.4  Albumin, Serum 3.7  Routine Chem:  02-Jul-15 10:41   Magnesium, Serum 1.9 (1.8-2.4 THERAPEUTIC RANGE: 4-7 mg/dL TOXIC: > 10 mg/dL  -----------------------)  Glucose, Serum  208  BUN 11  Creatinine (comp) 0.94  Sodium, Serum 139  Potassium, Serum 3.6  Chloride, Serum 106  CO2, Serum 22  Calcium (Total), Serum 8.6  Osmolality (calc) 283  eGFR (African American) >60  eGFR (Non-African American) >60 (eGFR values <108m/min/1.73 m2 may be an indication of chronic kidney disease (CKD). Calculated eGFR is useful in patients with stable renal function. The eGFR calculation will not be reliable in acutely ill patients when serum creatinine is changing rapidly. It is not useful in  patients on dialysis. The eGFR calculation may not be applicable to patients at the low and high extremes of body sizes, pregnant women, and vegetarians.)  Anion Gap 11    14:30   Hemoglobin A1c ([facility]) 5.6 (The American Diabetes Association recommends that a primary goal of therapy should be <7% and that physicians should reevaluate the treatment regimen in patients with HbA1c values consistently >8%.)  Cardiac:  02-Jul-15 10:41   CK, Total  310 (39-308 NOTE: NEW REFERENCE RANGE  02/19/2013)  CPK-MB, Serum 3.6 (Result(s) reported on 19 Jul 2013 at 11:18AM.)  Troponin I < 0.02 (0.00-0.05 0.05 ng/mL or less: NEGATIVE  Repeat testing in 3-6 hrs  if clinically indicated. >0.05 ng/mL: POTENTIAL  MYOCARDIAL INJURY. Repeat  testing in 3-6 hrs if  clinically indicated. NOTE: An increase or decrease  of 30% or more on serial  testing suggests a  clinically important change)    14:30   CK, Total 255 (39-308 NOTE: NEW REFERENCE RANGE  02/19/2013)  CPK-MB, Serum 3.2 (Result(s)  reported on 19 Jul 2013 at 03:00PM.)  Troponin I < 0.02 (0.00-0.05 0.05 ng/mL or less: NEGATIVE  Repeat testing in 3-6 hrs  if clinically indicated. >0.05 ng/mL: POTENTIAL  MYOCARDIAL INJURY. Repeat  testing in 3-6 hrs if  clinically indicated. NOTE: An increase or decrease  of 30% or more on serial  testing suggests a  clinically important change)  Routine Hem:  02-Jul-15 10:41   WBC (CBC) 7.1  RBC (CBC) 5.43  Hemoglobin (CBC) 17.0  Hematocrit (CBC) 49.9  Platelet Count (CBC) 216 (  Result(s) reported on 19 Jul 2013 at 10:59AM.)  MCV 92  MCH 31.4  MCHC 34.1  RDW 12.9   EKG:  EKG Interp. by me   Interpretation EKG shows afib, 164, poor r progression, lad, no acute st/t changes.    Morphine: N/V/Diarrhea  Vital Signs/Nurse's Notes:  **Vital Signs.:   02-Jul-15 12:50  Vital Signs Type Admission  Temperature Temperature (F) 97.6  Celsius 36.4  Pulse Pulse 105  Pulse source if not from Vital Sign Device per cardiac monitor  Respirations Respirations 15  Systolic BP Systolic BP 678  Diastolic BP (mmHg) Diastolic BP (mmHg) 81  Mean BP 96  BP Source  if not from Vital Sign Device non-invasive  Pulse Ox % Pulse Ox % 98  Pulse Ox Activity Level  At rest  Oxygen Delivery Room Air/ 21 %    Impression 1.  PAF with RVR:   Pt presented with recurrent rapid afib this AM.  As far as he knows, this is his first episode in about 5 yrs.  He was dyspneic @ faster rates but is currently asymptomatic.  He is on IV dilt and so far has received two dose of amiodarone 450m with a plan to repeat x  this evening.   then amio B ID tomorrow --Would add anticoagulation/eliquis 5 mg po B ID  for the time being, in case he required DCCV.   Upon all prior episodes, he did convert on his own and had never required antiarrhythmic or DCCV.  Echo just performed - we will take a look at it when available.  His LA was mod dil by echo in 2012.  CHA2DS2VASc = 1 (HTN).   --Provided that he does not  require DCCV, would plan to add ASA @ d/c (was not taking this previously) along with maintenance dose of BB.   ---He will need to change his dietary habits and significantly cut back on ETOH (2-4 beers/day) and coffee (4-5 cups/day).  2.  HTN:   Stable.  3.  Hyperglycemia:   no prior h/o DM.  Per IM.  4.  Hypokalemia:   mild.  K 3.6.  Will supp.  Mg 1.9.  5.  Vertigo/Headaches:   Per IM.  6.  OSA:   Uses CPAP @ home.  7. ETOH drink 1 to 4 beers today   Electronic Signatures: BRogelia Mire(NP)  (Signed 02-Jul-15 17:12)  Authored: General Aspect/Present Illness, History and Physical Exam, Review of System, Family & Social History, Home Medications, Labs, EKG , Allergies, Vital Signs/Nurse's Notes, Impression/Plan GIda Rogue(MD)  (Signed 02-Jul-15 17:55)  Authored: General Aspect/Present Illness, History and Physical Exam, Review of System, Family & Social History, Past Medical History, EKG , Vital Signs/Nurse's Notes, Impression/Plan  Co-Signer: General Aspect/Present Illness, Family & Social History, Home Medications, Allergies   Last Updated: 02-Jul-15 17:55 by GIda Rogue(MD)

## 2014-05-11 NOTE — H&P (Signed)
PATIENT NAME:  Collin Holt, Collin Holt MR#:  [id number] DATE OF BIRTH:  1960-10-26  DATE OF ADMISSION:  07/19/2013  PRIMARY CARE PHYSICIAN:  Dr. Georgie Chard.  CHIEF COMPLAINT:   Elevated heart rate.  Mr. Cockerell is a pleasant 54 year old Caucasian gentleman with history of migraine headaches, history of Afib in the past, comes in with increasing heart rate and palpitations, which he felt this morning when he woke up.  He checked his carotid pulse, felt it rapid; hence, came to the Emergency Room, was found to be in rapid Afib with RVR with heart rate in the 160s. He received IV Cardizem 20 mg x 2; it slowed his heart rate down.  Started on IV Cardizem at 5 mg per hour.  Currently, his heart rate is anywhere from 99 to 210. The patient denied any of these symptoms yesterday.  Denies any chest pain or shortness of breath.  He is being admitted for further evaluation and management.  PAST MEDICAL HISTORY:   1.  Migraine headaches. 2.  History of Afib, not on any rate blocking agent. 3.  Depression.  MEDICATIONS:   1.  Tizanidine 2 mg 1 b.i.d.  2.  Imitrex 100 mg p.o. daily as needed. 3.  Effexor-XR 75 mg daily.  SOCIAL HISTORY:  The patient does not work for the last 3 to 4 years.  Has history of smoking.  Smoking cessation was discussed with the patient; about 3 minutes were spent.  No alcohol or any drug use.  FAMILY HISTORY:  Positive for hypertension and stroke.  REVIEW OF SYSTEMS:   CONSTITUTIONAL:  No fever.  Positive for fatigue. No weakness.  No gain or weight loss. EYES:  No blurred or double vision, cataract or glaucoma. ENT:  No tinnitus, ear pain, hearing loss or post nasal drip.  RESPIRATORY:  No cough, wheeze, hemoptysis or dyspnea. CARDIOVASCULAR:  No chest pain or orthopnea.  Positive for Afib.  No dyspnea on exertion.  Positive for palpitations. GASTROINTESTINAL:  No nausea, vomiting, diarrhea, abdominal or hematemesis. GENITOURINARY:   No dysuria, hematuria or  frequency. ENDOCRINE:  No polyuria, nocturia or thyroid problems. HEMATOLOGY:  No anemia or easy bruising. No bleeding. SKIN:  No acne, rash or lesions. MUSCULOSKELETAL:  No arthritis, cramps or swelling. NEUROLOGIC:  No CVA, TIA or dementia. PSYCHIATRIC:  No anxiety, depression or bipolar disorder. All other systems reviewed and negative.  PHYSICAL EXAMINATION:  GENERAL:  The patient is awake. He is alert, oriented x 3. Not in acute distress. VITAL SIGNS:  He is afebrile. Pulse is 90 to 105, irregularly irregular.  Blood pressure is 126/81. Sats are 98% on room air. HEENT:  Atraumatic, normocephalic.  PERRLA. EMOI.  Oral mucosa is moist. NECK: Supple.  No JVD, no carotid bruit. RESPIRATORY:  Clear to auscultation bilaterally.  No rales, rhonchi, respiratory distress or labored breathing. CARDIOVASCULAR:  Irregularly irregular heart rhythm.  No murmur heard.  PMI not lateralized.   CHEST: Nontender. Good pedal pulses, good femoral pulses. No lower extremity edema. ABDOMEN:  Obese, soft, nontender.  No organomegaly. Positive bowel sounds. NEUROLOGIC: Grossly intact cranial nerves II through XII.  No motor or sensory deficits. PSYCHIATRIC:  The patient is awake, alert, oriented x 3.  EKG shows Afib with RVR.  Chest x-ray:  No acute cardiopulmonary abnormality.  Glucose 208. BUN is 11. Creatinine is 0.9.  Sodium is 139.   Potassium is 3.6. Chloride is 106. LFTs within normal limits.  CBC within normal limits and magnesium is 1.9.  TSH is 3.10.  CK total is 310.  ASSESSMENT:  A 54 year old Mr. Geraghty with history of migraine headaches and depression along with history of atrial fib in the past, comes in with: 1.  Rapid atrial fib with rapid ventricular response. The patient is currently on Cardizem drip.  We will keep the patient in the intensive care unit.  Spoke with Dr. Rockey Situ, who is the patient's primary cardiologist.  We will try to load him with p.o. amiodarone and see if the patient  converts. Continue subQ Lovenox b.i.d. for anticoagulation. The patient has no history of any GI bleeding or any other bleeding issues.  Cycle cardiac enzymes x 3.  Check echo Doppler of the heart.  Further followup recommendations per Dr. Rockey Situ. 2.  Migraine headaches; continue Imitrex as needed.  3.  Depression, will resume Effexor. 4.  Deep vein thrombosis prophylaxis will be subQ Lovenox. The patient is already on subQ Lovenox.  Further workup according to the patient's clinical course.  Hospital admission plan was discussed with the patient.  Case was also discussed with Dr. Rockey Situ who is in agreement with the plan.  CRITICAL TIME SPENT:  50 minutes.    ____________________________ Hart Rochester Posey Pronto, MD sap:dmm D: 07/19/2013 14:32:00 ET T: 07/19/2013 17:20:24 ET JOB#: [id number]  cc: Sugey Trevathan A. Posey Pronto, MD, <Dictator> Leonie Douglas. Doy Hutching, MD [name], MD Ilda Basset MD ELECTRONICALLY SIGNED 07/21/2013 10:35

## 2014-05-24 DIAGNOSIS — G4761 Periodic limb movement disorder: Secondary | ICD-10-CM | POA: Diagnosis not present

## 2014-05-24 DIAGNOSIS — G4733 Obstructive sleep apnea (adult) (pediatric): Secondary | ICD-10-CM | POA: Diagnosis not present

## 2014-06-20 ENCOUNTER — Telehealth: Payer: Self-pay

## 2014-06-20 NOTE — Telephone Encounter (Signed)
Left message on machine for patient to contact the office.   

## 2014-06-20 NOTE — Telephone Encounter (Signed)
Pt wife called, states that pt was seen in ED in January for Afib, and saw Dr. Gilford Rile, (ED Dr.) He was prescribed Eliquis, and Metoprolol, she asks if he still needs to be on these medications, and if so he needs refills sent to [facility]. Please call and advise.

## 2014-06-21 ENCOUNTER — Telehealth: Payer: Self-pay | Admitting: *Deleted

## 2014-06-21 ENCOUNTER — Other Ambulatory Visit: Payer: Self-pay

## 2014-06-21 MED ORDER — APIXABAN 5 MG PO TABS: 5.0000 mg | ORAL_TABLET | Freq: Two times a day (BID) | ORAL | Status: DC

## 2014-06-21 NOTE — Telephone Encounter (Signed)
Refill sent for eliquis 5 mg; patient is past due for a follow up with Dr. Rockey Situ.

## 2014-06-21 NOTE — Telephone Encounter (Signed)
Pt is OD for 46m fu, lmov to sch that

## 2014-06-24 DIAGNOSIS — G4761 Periodic limb movement disorder: Secondary | ICD-10-CM | POA: Diagnosis not present

## 2014-06-24 DIAGNOSIS — G4733 Obstructive sleep apnea (adult) (pediatric): Secondary | ICD-10-CM | POA: Diagnosis not present

## 2014-07-11 DIAGNOSIS — R5382 Chronic fatigue, unspecified: Secondary | ICD-10-CM | POA: Diagnosis not present

## 2014-07-11 DIAGNOSIS — H8113 Benign paroxysmal vertigo, bilateral: Secondary | ICD-10-CM | POA: Diagnosis not present

## 2014-07-12 DIAGNOSIS — H8113 Benign paroxysmal vertigo, bilateral: Secondary | ICD-10-CM | POA: Diagnosis not present

## 2014-07-12 DIAGNOSIS — Z125 Encounter for screening for malignant neoplasm of prostate: Secondary | ICD-10-CM | POA: Diagnosis not present

## 2014-07-12 DIAGNOSIS — R7309 Other abnormal glucose: Secondary | ICD-10-CM | POA: Diagnosis not present

## 2014-07-12 DIAGNOSIS — R5382 Chronic fatigue, unspecified: Secondary | ICD-10-CM | POA: Diagnosis not present

## 2014-07-12 DIAGNOSIS — R5383 Other fatigue: Secondary | ICD-10-CM | POA: Diagnosis not present

## 2014-07-18 DIAGNOSIS — G4733 Obstructive sleep apnea (adult) (pediatric): Secondary | ICD-10-CM | POA: Diagnosis not present

## 2014-07-18 DIAGNOSIS — E291 Testicular hypofunction: Secondary | ICD-10-CM | POA: Diagnosis not present

## 2014-07-18 DIAGNOSIS — R748 Abnormal levels of other serum enzymes: Secondary | ICD-10-CM | POA: Diagnosis not present

## 2014-07-18 DIAGNOSIS — E349 Endocrine disorder, unspecified: Secondary | ICD-10-CM | POA: Insufficient documentation

## 2014-07-19 DIAGNOSIS — R5383 Other fatigue: Secondary | ICD-10-CM | POA: Diagnosis not present

## 2014-07-19 DIAGNOSIS — E291 Testicular hypofunction: Secondary | ICD-10-CM | POA: Diagnosis not present

## 2014-07-19 DIAGNOSIS — R5381 Other malaise: Secondary | ICD-10-CM | POA: Diagnosis not present

## 2014-07-24 DIAGNOSIS — G4733 Obstructive sleep apnea (adult) (pediatric): Secondary | ICD-10-CM | POA: Diagnosis not present

## 2014-07-24 DIAGNOSIS — G4761 Periodic limb movement disorder: Secondary | ICD-10-CM | POA: Diagnosis not present

## 2014-07-30 ENCOUNTER — Ambulatory Visit (INDEPENDENT_AMBULATORY_CARE_PROVIDER_SITE_OTHER): Payer: Commercial Managed Care - HMO | Admitting: Cardiovascular Disease

## 2014-07-30 ENCOUNTER — Encounter: Payer: Self-pay | Admitting: Cardiovascular Disease

## 2014-07-30 VITALS — BP 130/80 | HR 99 | Ht 72.0 in | Wt 283.0 lb

## 2014-07-30 DIAGNOSIS — I10 Essential (primary) hypertension: Secondary | ICD-10-CM | POA: Diagnosis not present

## 2014-07-30 DIAGNOSIS — R002 Palpitations: Secondary | ICD-10-CM | POA: Diagnosis not present

## 2014-07-30 DIAGNOSIS — I4891 Unspecified atrial fibrillation: Secondary | ICD-10-CM | POA: Diagnosis not present

## 2014-07-30 DIAGNOSIS — G4733 Obstructive sleep apnea (adult) (pediatric): Secondary | ICD-10-CM | POA: Diagnosis not present

## 2014-07-30 MED ORDER — RIVAROXABAN 20 MG PO TABS: 20.0000 mg | ORAL_TABLET | Freq: Every day | ORAL | Status: DC

## 2014-07-30 MED ORDER — METOPROLOL SUCCINATE ER 50 MG PO TB24: 50.0000 mg | ORAL_TABLET | Freq: Every day | ORAL | Status: DC

## 2014-07-30 NOTE — Progress Notes (Signed)
Patient ID: Collin Holt, male    DOB: 1960-12-04, 54 y.o.   MRN: [id number]  HPI Comments: Collin Holt is a 54 year old gentleman with obesity, obstructive sleep apnea, paroxysmal atrial fibrillation, long history of vertigo, periodic alcohol use who is on disability for headaches and severe vertigo, recently in the hospital July 2015 for atrial fibrillation. He was given amiodarone in the hospital and converted to normal sinus rhythm shortly after discharge. Prior cardiac catheterization every 2012 showing no significant CAD Prior echocardiogram 01/2010 showing normal ejection fraction He presents today for follow-up of his atrial fibrillation  He reports that he has rare episodes of atrial fibrillation, nothing lasting very long In general is relatively sedentary, weight continues to be an issue He is on disability for continued severe vertigo when he lays down in bed, severe tinnitus Denies any significant shortness of breath or chest pain with exertion  He is been taking metoprolol tartrate only once per day in the evening, taking eliquis only once a day in the evening ($40 per month)   EKG shows normal sinus rhythm with rate 99 beats per minute, no significant ST or T wave changes   Allergies  Allergen Reactions  . Morphine And Related Nausea And Vomiting   Outpatient Encounter Prescriptions as of 07/30/2014  Medication Sig Note  . testosterone cypionate (DEPOTESTOSTERONE CYPIONATE) 200 MG/ML injection Inject 200 mg into the muscle every 14 (fourteen) days.  07/30/2014: Received from: External Pharmacy  . tizanidine (ZANAFLEX) 2 MG capsule Take 2 mg by mouth as needed for muscle spasms.   Marland Kitchen venlafaxine (EFFEXOR) 75 MG tablet Take 75 mg by mouth daily.     . [DISCONTINUED] apixaban (ELIQUIS) 5 MG TABS tablet Take 5 mg by mouth daily.  07/30/2014: Received from: [facility]  . [DISCONTINUED] amiodarone (PACERONE) 200 MG tablet Take 1 tablet (200 mg total) by mouth 2  (two) times daily as needed. (Patient not taking: Reported on 07/30/2014)   .  apixaban (ELIQUIS) 5 MG TABS tablet Take 1 tablet (5 mg total) by mouth 2 (two) times daily. (Patient not taking: Reported on 07/30/2014)   . metoprolol (LOPRESSOR) 50 MG tablet Take 1 tablet (50 mg total) by mouth 2 (two) times daily. (Patient not taking: Reported on 07/30/2014)    Facility-Administered Encounter Medications as of 07/30/2014  Medication  . promethazine (PHENERGAN) injection 25 mg    Past Medical History  Diagnosis Date  . PAF (paroxysmal atrial fibrillation)     a. 2012 Echo: nl LV fxn, mod dil LA.  Marland Kitchen HTN (hypertension)   . Borderline hyperlipidemia   . Chronic headaches   . DOE (dyspnea on exertion)     a. 02/2010 Cath: nl cors;  b. 02/2010 CPX: good fxnl capacity.  . OSA (obstructive sleep apnea)     Past Surgical History  Procedure Laterality Date  . Shoulder surgery      Right x 2    Social History  reports that he quit smoking about 15 years ago. His smoking use included Cigarettes. He has a 10 pack-year smoking history. He has quit using smokeless tobacco. He reports that he drinks alcohol. He reports that he does not use illicit drugs.  Family History family history includes Cancer in his father; Emphysema in his father. There is no history of Prostate cancer, Colon cancer, Coronary artery disease, or Diabetes.       Review of Systems  Constitutional: Negative.   HENT:  Severe vertigo and tinnitus  Respiratory: Negative.   Cardiovascular: Negative.   Gastrointestinal: Negative.   Musculoskeletal: Negative.   Allergic/Immunologic: Negative.   Neurological: Positive for dizziness.       Chronic vertigo  Hematological: Negative.   Psychiatric/Behavioral: Negative.   All other systems reviewed and are negative.   BP 130/80 mmHg  Pulse 99  Ht 6' (1.829 m)  Wt 283 lb (128.368 kg)  BMI 38.37 kg/m2  Physical Exam  Constitutional: He is oriented to person, place,  and time. He appears well-developed and well-nourished.  HENT:  Head: Normocephalic.  Nose: Nose normal.  Mouth/Throat: Oropharynx is clear and moist.  Eyes: Conjunctivae are normal. Pupils are equal, round, and reactive to light.  Neck: Normal range of motion. Neck supple. No JVD present.  Cardiovascular: Normal rate, regular rhythm, S1 normal, S2 normal, normal heart sounds and intact distal pulses.  Exam reveals no gallop and no friction rub.   No murmur heard. Pulmonary/Chest: Effort normal and breath sounds normal. No respiratory distress. He has no wheezes. He has no rales. He exhibits no tenderness.  Abdominal: Soft. Bowel sounds are normal. He exhibits no distension. There is no tenderness.  Musculoskeletal: Normal range of motion. He exhibits no edema or tenderness.  Lymphadenopathy:    He has no cervical adenopathy.  Neurological: He is alert and oriented to person, place, and time. Coordination normal.  Skin: Skin is warm and dry. No rash noted. No erythema.  Psychiatric: He has a normal mood and affect. His behavior is normal. Judgment and thought content normal.      Assessment and Plan   Nursing note and vitals reviewed.

## 2014-07-30 NOTE — Assessment & Plan Note (Signed)
No recent episodes of chest discomfort. No further workup at this time

## 2014-07-30 NOTE — Patient Instructions (Signed)
You are doing well.  Please change the eliquis to xarelto one a day (with supper) Change the metoprolol tartrate to metoprolol succinate (one a day)  Please call us if you have new issues that need to be addressed before your next appt.  Your physician wants you to follow-up in: 6 months.  You will receive a reminder letter in the mail two months in advance. If you don't receive a letter, please call our office to schedule the follow-up appointment.

## 2014-07-30 NOTE — Assessment & Plan Note (Signed)
He is not taking his medications twice a day as directed Recommended he change his metoprolol tartrate to metoprolol succinate 50 mg daily.  Recommended he monitor his heart rate and call us if his resting heart rate continues to run in the 90s. Normal sinus rhythm today We have also change him from eliquis 5 mill grams twice a day to xarelto 20 mg daily

## 2014-07-30 NOTE — Assessment & Plan Note (Signed)
Blood pressure is well controlled on today's visit. No changes made to the medications. 

## 2014-07-30 NOTE — Assessment & Plan Note (Signed)
Rare episodes of palpitations possibly secondary to paroxysmal atrial fibrillation. Medication changes as above

## 2014-07-30 NOTE — Assessment & Plan Note (Signed)
Compliant with his CPAP Still with difficulty sleeping secondary to vertigo

## 2014-08-19 DIAGNOSIS — G43719 Chronic migraine without aura, intractable, without status migrainosus: Secondary | ICD-10-CM | POA: Diagnosis not present

## 2014-08-19 DIAGNOSIS — G43019 Migraine without aura, intractable, without status migrainosus: Secondary | ICD-10-CM | POA: Diagnosis not present

## 2014-08-24 DIAGNOSIS — G4733 Obstructive sleep apnea (adult) (pediatric): Secondary | ICD-10-CM | POA: Diagnosis not present

## 2014-08-24 DIAGNOSIS — G4761 Periodic limb movement disorder: Secondary | ICD-10-CM | POA: Diagnosis not present

## 2014-09-09 DIAGNOSIS — R51 Headache: Secondary | ICD-10-CM | POA: Diagnosis not present

## 2014-09-09 DIAGNOSIS — G43719 Chronic migraine without aura, intractable, without status migrainosus: Secondary | ICD-10-CM | POA: Diagnosis not present

## 2014-09-09 DIAGNOSIS — G43019 Migraine without aura, intractable, without status migrainosus: Secondary | ICD-10-CM | POA: Diagnosis not present

## 2014-09-09 DIAGNOSIS — M791 Myalgia: Secondary | ICD-10-CM | POA: Diagnosis not present

## 2014-09-09 DIAGNOSIS — M542 Cervicalgia: Secondary | ICD-10-CM | POA: Diagnosis not present

## 2014-09-11 ENCOUNTER — Telehealth: Payer: Self-pay

## 2014-09-11 NOTE — Telephone Encounter (Signed)
Request from [facility], [place] , sent to [employer] on 09/12/2014 .

## 2014-09-24 DIAGNOSIS — G4733 Obstructive sleep apnea (adult) (pediatric): Secondary | ICD-10-CM | POA: Diagnosis not present

## 2014-09-24 DIAGNOSIS — G4761 Periodic limb movement disorder: Secondary | ICD-10-CM | POA: Diagnosis not present

## 2014-10-01 DIAGNOSIS — G43019 Migraine without aura, intractable, without status migrainosus: Secondary | ICD-10-CM | POA: Diagnosis not present

## 2014-10-01 DIAGNOSIS — R51 Headache: Secondary | ICD-10-CM | POA: Diagnosis not present

## 2014-10-01 DIAGNOSIS — G43719 Chronic migraine without aura, intractable, without status migrainosus: Secondary | ICD-10-CM | POA: Diagnosis not present

## 2014-10-01 DIAGNOSIS — M791 Myalgia: Secondary | ICD-10-CM | POA: Diagnosis not present

## 2014-10-01 DIAGNOSIS — M542 Cervicalgia: Secondary | ICD-10-CM | POA: Diagnosis not present

## 2014-10-15 DIAGNOSIS — G43019 Migraine without aura, intractable, without status migrainosus: Secondary | ICD-10-CM | POA: Diagnosis not present

## 2014-10-15 DIAGNOSIS — M791 Myalgia: Secondary | ICD-10-CM | POA: Diagnosis not present

## 2014-10-15 DIAGNOSIS — R51 Headache: Secondary | ICD-10-CM | POA: Diagnosis not present

## 2014-10-15 DIAGNOSIS — G43719 Chronic migraine without aura, intractable, without status migrainosus: Secondary | ICD-10-CM | POA: Diagnosis not present

## 2014-10-15 DIAGNOSIS — M542 Cervicalgia: Secondary | ICD-10-CM | POA: Diagnosis not present

## 2014-10-24 DIAGNOSIS — G4761 Periodic limb movement disorder: Secondary | ICD-10-CM | POA: Diagnosis not present

## 2014-10-24 DIAGNOSIS — G4733 Obstructive sleep apnea (adult) (pediatric): Secondary | ICD-10-CM | POA: Diagnosis not present

## 2014-11-05 DIAGNOSIS — E291 Testicular hypofunction: Secondary | ICD-10-CM | POA: Diagnosis not present

## 2014-11-12 DIAGNOSIS — I480 Paroxysmal atrial fibrillation: Secondary | ICD-10-CM | POA: Diagnosis not present

## 2014-11-12 DIAGNOSIS — Z13220 Encounter for screening for lipoid disorders: Secondary | ICD-10-CM | POA: Diagnosis not present

## 2014-11-12 DIAGNOSIS — I10 Essential (primary) hypertension: Secondary | ICD-10-CM | POA: Diagnosis not present

## 2014-11-12 DIAGNOSIS — Z79899 Other long term (current) drug therapy: Secondary | ICD-10-CM | POA: Diagnosis not present

## 2014-11-12 DIAGNOSIS — Z125 Encounter for screening for malignant neoplasm of prostate: Secondary | ICD-10-CM | POA: Diagnosis not present

## 2014-11-12 DIAGNOSIS — F3341 Major depressive disorder, recurrent, in partial remission: Secondary | ICD-10-CM | POA: Diagnosis not present

## 2014-11-12 DIAGNOSIS — E291 Testicular hypofunction: Secondary | ICD-10-CM | POA: Diagnosis not present

## 2014-11-24 DIAGNOSIS — G4761 Periodic limb movement disorder: Secondary | ICD-10-CM | POA: Diagnosis not present

## 2014-11-24 DIAGNOSIS — G4733 Obstructive sleep apnea (adult) (pediatric): Secondary | ICD-10-CM | POA: Diagnosis not present

## 2014-11-29 DIAGNOSIS — M791 Myalgia: Secondary | ICD-10-CM | POA: Diagnosis not present

## 2014-12-09 DIAGNOSIS — M791 Myalgia: Secondary | ICD-10-CM | POA: Diagnosis not present

## 2014-12-19 DIAGNOSIS — G4733 Obstructive sleep apnea (adult) (pediatric): Secondary | ICD-10-CM | POA: Diagnosis not present

## 2014-12-23 DIAGNOSIS — G4733 Obstructive sleep apnea (adult) (pediatric): Secondary | ICD-10-CM | POA: Diagnosis not present

## 2014-12-23 DIAGNOSIS — E538 Deficiency of other specified B group vitamins: Secondary | ICD-10-CM | POA: Diagnosis not present

## 2014-12-23 DIAGNOSIS — R7989 Other specified abnormal findings of blood chemistry: Secondary | ICD-10-CM | POA: Diagnosis not present

## 2014-12-23 DIAGNOSIS — M791 Myalgia: Secondary | ICD-10-CM | POA: Diagnosis not present

## 2014-12-23 DIAGNOSIS — G629 Polyneuropathy, unspecified: Secondary | ICD-10-CM | POA: Diagnosis not present

## 2014-12-27 DIAGNOSIS — R6889 Other general symptoms and signs: Secondary | ICD-10-CM | POA: Diagnosis not present

## 2015-01-01 DIAGNOSIS — R899 Unspecified abnormal finding in specimens from other organs, systems and tissues: Secondary | ICD-10-CM | POA: Diagnosis not present

## 2015-01-08 DIAGNOSIS — M6281 Muscle weakness (generalized): Secondary | ICD-10-CM | POA: Diagnosis not present

## 2015-01-08 DIAGNOSIS — M791 Myalgia: Secondary | ICD-10-CM | POA: Diagnosis not present

## 2015-01-10 DIAGNOSIS — R7989 Other specified abnormal findings of blood chemistry: Secondary | ICD-10-CM | POA: Diagnosis not present

## 2015-01-10 DIAGNOSIS — G4733 Obstructive sleep apnea (adult) (pediatric): Secondary | ICD-10-CM | POA: Diagnosis not present

## 2015-01-10 DIAGNOSIS — M791 Myalgia: Secondary | ICD-10-CM | POA: Diagnosis not present

## 2015-01-10 DIAGNOSIS — G629 Polyneuropathy, unspecified: Secondary | ICD-10-CM | POA: Diagnosis not present

## 2015-01-15 ENCOUNTER — Inpatient Hospital Stay: Payer: Commercial Managed Care - HMO

## 2015-01-15 ENCOUNTER — Inpatient Hospital Stay: Payer: Commercial Managed Care - HMO | Attending: Oncology | Admitting: Oncology

## 2015-01-15 DIAGNOSIS — I480 Paroxysmal atrial fibrillation: Secondary | ICD-10-CM | POA: Insufficient documentation

## 2015-01-15 DIAGNOSIS — Z79899 Other long term (current) drug therapy: Secondary | ICD-10-CM | POA: Diagnosis not present

## 2015-01-15 DIAGNOSIS — Z87891 Personal history of nicotine dependence: Secondary | ICD-10-CM | POA: Insufficient documentation

## 2015-01-15 DIAGNOSIS — Z7901 Long term (current) use of anticoagulants: Secondary | ICD-10-CM | POA: Insufficient documentation

## 2015-01-15 DIAGNOSIS — G4733 Obstructive sleep apnea (adult) (pediatric): Secondary | ICD-10-CM | POA: Insufficient documentation

## 2015-01-15 DIAGNOSIS — I10 Essential (primary) hypertension: Secondary | ICD-10-CM | POA: Insufficient documentation

## 2015-01-15 NOTE — Progress Notes (Signed)
Patient's last labs came back with elevated ferritin and has been taking testosterone injection for 2 months.

## 2015-01-17 ENCOUNTER — Other Ambulatory Visit: Payer: Self-pay | Admitting: Physician Assistant

## 2015-01-17 DIAGNOSIS — R945 Abnormal results of liver function studies: Secondary | ICD-10-CM | POA: Diagnosis not present

## 2015-01-17 DIAGNOSIS — F1010 Alcohol abuse, uncomplicated: Secondary | ICD-10-CM | POA: Diagnosis not present

## 2015-01-20 NOTE — Progress Notes (Signed)
[place]  [phone] [phone]  ID: Collin Holt OB: [birth year]  MR#: [id number]  [id number]  Patient Care Team: [name], MD as PCP - General (Internal Medicine)  CHIEF COMPLAINT:  Chief Complaint  Patient presents with  . New Evaluation  . Hemochromatosis     INTERVAL HISTORY: Patient is a 55 year old male who was found to have an elevated ferritin level of 647. 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 He currently feels well and is asymptomatic. He has no neurologic complaints. He denies any recent fevers or illnesses. He has a good appetite and denies weight loss. He has no chest pain or shortness of breath. He denies any nausea, vomiting, constipation, or diarrhea. He has no urinary complaints. Patient offers no further specific complaints today.  REVIEW OF SYSTEMS:   Review of Systems  Constitutional: Negative.  Negative for fever, weight loss and malaise/fatigue.  Respiratory: Negative.   Cardiovascular: Negative.   Gastrointestinal: Negative.  Negative for blood in stool and melena.  Musculoskeletal: Negative.   Skin: Negative for itching.  Neurological: Negative.  Negative for weakness.    As per HPI. Otherwise, a complete review of systems is negatve.  PAST MEDICAL HISTORY: Past Medical History  Diagnosis Date  . PAF (paroxysmal atrial fibrillation)     a. 2012 Echo: nl LV fxn, mod dil LA.  Marland Kitchen HTN (hypertension)   . Borderline hyperlipidemia   . Chronic headaches   . DOE (dyspnea on exertion)     a. 02/2010 Cath: nl cors;  b. 02/2010 CPX: good fxnl capacity.  . OSA (obstructive sleep apnea)     PAST SURGICAL HISTORY: Past Surgical History  Procedure Laterality Date  . Shoulder surgery      Right x 2    FAMILY HISTORY Family History  Problem Relation Age of Onset  . Emphysema Father   . Cancer Father   . Prostate cancer Neg Hx   .  Colon cancer Neg Hx   . Coronary artery disease Neg Hx   . Diabetes Neg Hx        ADVANCED DIRECTIVES:    HEALTH MAINTENANCE: Social History  Substance Use Topics  . Smoking status: Former Smoker -- 1.00 packs/day for 10 years    Types: Cigarettes    Quit date: 01/19/1999  . Smokeless tobacco: Former Systems developer  . Alcohol Use: Yes     Comment: wine on and off 2 x's week     Colonoscopy:  PAP:  Bone density:  Lipid panel:  Allergies  Allergen Reactions  . Morphine And Related Nausea And Vomiting  . Terfenadine Other (See Comments)    Current Outpatient Prescriptions  Medication Sig Dispense Refill  . metoprolol succinate (TOPROL-XL) 50 MG 24 hr tablet Take 1 tablet (50 mg total) by mouth daily. Take with or immediately following a meal. 30 tablet 11  . rivaroxaban (XARELTO) 20 MG TABS tablet Take 1 tablet (20 mg total) by mouth daily with supper. 30 tablet 11  . testosterone cypionate (DEPOTESTOSTERONE CYPIONATE) 200 MG/ML injection Inject 200 mg into the muscle every 14 (fourteen) days.     . tizanidine (ZANAFLEX) 2 MG capsule Take 2 mg by mouth as needed for muscle spasms.    Marland Kitchen venlafaxine (EFFEXOR) 75 MG tablet Take 75 mg by mouth daily.       Current Facility-Administered Medications  Medication Dose Route Frequency Provider Last Rate Last Dose  . promethazine (  PHENERGAN) injection 25 mg  25 mg Intravenous Q6H PRN Janith Lima, MD   25 mg at 05/01/10 1209    OBJECTIVE: Filed Vitals:   01/15/15 1443  BP: 148/93  Pulse: 60  Temp: 97.2 F (36.2 C)  Resp: 18     Body mass index is 39.85 kg/(m^2).    ECOG FS:0 - Asymptomatic  General: Well-developed, well-nourished, no acute distress. Eyes: Pink conjunctiva, anicteric sclera. HEENT: Normocephalic, moist mucous membranes, clear oropharnyx. Lungs: Clear to auscultation bilaterally. Heart: Regular rate and rhythm. No rubs, murmurs, or gallops. Abdomen: Soft, nontender, nondistended. No organomegaly noted, normoactive  bowel sounds. Musculoskeletal: No edema, cyanosis, or clubbing. Neuro: Alert, answering all questions appropriately. Cranial nerves grossly intact. Skin: No rashes or petechiae noted. Psych: Normal affect. Lymphatics: No cervical, calvicular, axillary or inguinal LAD.   LAB RESULTS:  Lab Results  Component Value Date   NA 139 07/20/2013   K 4.2 07/20/2013   CL 105 07/20/2013   CO2 26 07/20/2013   GLUCOSE 104* 07/20/2013   BUN 11 07/20/2013   CREATININE 0.95 07/20/2013   CALCIUM 9.0 07/20/2013   PROT 7.4 07/19/2013   ALBUMIN 3.7 07/19/2013   AST 29 07/19/2013   ALT 32 07/19/2013   ALKPHOS 65 07/19/2013   BILITOT 1.1* 07/19/2013   GFRNONAA >60 07/20/2013   GFRAA >60 07/20/2013    Lab Results  Component Value Date   WBC 7.1 07/19/2013   NEUTROABS 3.3 02/13/2010   HGB 17.0 07/19/2013   HCT 49.9 07/19/2013   MCV 92 07/19/2013   PLT 216 07/19/2013     STUDIES: No results found.  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  PLAN:    1. Hemachromatosis: Patient has a significantly elevated ferritin level of 647. He will benefit from 400 mL phlebotomy today. Patient will return to clinic every 3 weeks for laboratory work and consideration of phlebotomy. Goal ferritin will be between 50 and 100. Patient will return to clinic in 3 months with repeat laboratory work and further evaluation.  Once patient reaches his goal ferritin, he will likely require maintenance phlebotomy.  Approximately 45 minutes was spent in discussion of which greater than 50% was consultation.  Patient expressed understanding and was in agreement with this plan. He also understands that He can call clinic at any time with any questions, concerns, or complaints.   [name], MD   01/20/2015 11:00 AM

## 2015-01-21 ENCOUNTER — Ambulatory Visit: Payer: Commercial Managed Care - HMO

## 2015-01-23 ENCOUNTER — Ambulatory Visit
Admission: RE | Admit: 2015-01-23 | Discharge: 2015-01-23 | Disposition: A | Discharge status: Home / Self Care | Payer: Commercial Managed Care - HMO | Source: Ambulatory Visit | Attending: Physician Assistant | Admitting: Physician Assistant

## 2015-01-23 DIAGNOSIS — R945 Abnormal results of liver function studies: Secondary | ICD-10-CM

## 2015-01-23 DIAGNOSIS — K760 Fatty (change of) liver, not elsewhere classified: Secondary | ICD-10-CM | POA: Diagnosis not present

## 2015-01-23 DIAGNOSIS — K4090 Unilateral inguinal hernia, without obstruction or gangrene, not specified as recurrent: Secondary | ICD-10-CM | POA: Diagnosis not present

## 2015-01-23 DIAGNOSIS — K7689 Other specified diseases of liver: Secondary | ICD-10-CM | POA: Diagnosis not present

## 2015-01-23 MED ORDER — IOHEXOL 350 MG/ML SOLN
100.0000 mL | Freq: Once | INTRAVENOUS | Status: AC | PRN
  Administered 2015-01-23: 100 mL via INTRAVENOUS

## 2015-01-27 ENCOUNTER — Ambulatory Visit: Payer: Commercial Managed Care - HMO | Admitting: Cardiovascular Disease

## 2015-01-27 ENCOUNTER — Ambulatory Visit (INDEPENDENT_AMBULATORY_CARE_PROVIDER_SITE_OTHER): Payer: Commercial Managed Care - HMO | Admitting: Cardiovascular Disease

## 2015-01-27 ENCOUNTER — Encounter: Payer: Self-pay | Admitting: Cardiovascular Disease

## 2015-01-27 VITALS — BP 120/80 | HR 58 | Ht 72.0 in | Wt 293.5 lb

## 2015-01-27 DIAGNOSIS — G4733 Obstructive sleep apnea (adult) (pediatric): Secondary | ICD-10-CM | POA: Diagnosis not present

## 2015-01-27 DIAGNOSIS — I4891 Unspecified atrial fibrillation: Secondary | ICD-10-CM | POA: Diagnosis not present

## 2015-01-27 DIAGNOSIS — I10 Essential (primary) hypertension: Secondary | ICD-10-CM | POA: Diagnosis not present

## 2015-01-27 DIAGNOSIS — R002 Palpitations: Secondary | ICD-10-CM | POA: Diagnosis not present

## 2015-01-27 NOTE — Progress Notes (Signed)
Patient ID: Collin Holt, male    DOB: September 07, 1960, 55 y.o.   MRN: [id number]  HPI Comments: Collin Holt is a 55 year old gentleman with obesity, obstructive sleep apnea, paroxysmal atrial fibrillation, long history of vertigo, periodic alcohol use who is on disability for headaches and severe vertigo, recently in the hospital July 2015 for atrial fibrillation. He was given amiodarone in the hospital and converted to normal sinus rhythm shortly after discharge. Prior cardiac catheterization every 2012 showing no significant CAD Prior echocardiogram 01/2010 showing normal ejection fraction He presents today for follow-up of his atrial fibrillation History of sleep apnea, compliant with his CPAP  In follow-up, he reports that he is doing well. Rare episodes of tachycardia, short-lived in nature Reports that he very rarely takes a amiodarone, metoprolol pill extra to break the rhythm Since he has been compliant with his CPAP, significantly less atrial fibrillation  Recent diagnosis of hemochromatosis, followed by Dr. Grayland Ormond Having periodic lobotomy Also told he has a fatty liver, needs to lose weight Ferritin level elevated, 649   on disability for continued severe vertigo when he lays down in bed, severe tinnitus Denies any significant shortness of breath or chest pain with exertion  EKG shows normal sinus rhythm with rate 58 beats per minute, no significant ST or T wave changes  Allergies  Allergen Reactions  . Morphine And Related Nausea And Vomiting  . Terfenadine Other (See Comments)    Current Outpatient Prescriptions on File Prior to Visit  Medication Sig Dispense Refill  . metoprolol succinate (TOPROL-XL) 50 MG 24 hr tablet Take 1 tablet (50 mg total) by mouth daily. Take with or immediately following a meal. 30 tablet 11  . rivaroxaban (XARELTO) 20 MG TABS tablet Take 1 tablet (20 mg total) by mouth daily with supper. 30 tablet 11  . testosterone cypionate (DEPOTESTOSTERONE  CYPIONATE) 200 MG/ML injection Inject 200 mg into the muscle every 14 (fourteen) days.     . tizanidine (ZANAFLEX) 2 MG capsule Take 2 mg by mouth as needed for muscle spasms.    Marland Kitchen venlafaxine (EFFEXOR) 75 MG tablet Take 75 mg by mouth daily.       Current Facility-Administered Medications on File Prior to Visit  Medication Dose Route Frequency Provider Last Rate Last Dose  . promethazine (PHENERGAN) injection 25 mg  25 mg Intravenous Q6H PRN Janith Lima, MD   25 mg at 05/01/10 1209    Past Medical History  Diagnosis Date  . PAF (paroxysmal atrial fibrillation) ([place])     a. 2012 Echo: nl LV fxn, mod dil LA.  Marland Kitchen HTN (hypertension)   . Borderline hyperlipidemia   . Chronic headaches   . DOE (dyspnea on exertion)     a. 02/2010 Cath: nl cors;  b. 02/2010 CPX: good fxnl capacity.  . OSA (obstructive sleep apnea)     Past Surgical History  Procedure Laterality Date  . Shoulder surgery      Right x 2    Social History  reports that he quit smoking about 16 years ago. His smoking use included Cigarettes. He has a 10 pack-year smoking history. He has quit using smokeless tobacco. He reports that he drinks alcohol. He reports that he does not use illicit drugs.  Family History family history includes Cancer in his father; Emphysema in his father. There is no history of Prostate cancer, Colon cancer, Coronary artery disease, or Diabetes.      Review of Systems  Constitutional: Negative.   HENT:  Severe vertigo and tinnitus  Respiratory: Negative.   Cardiovascular: Negative.   Gastrointestinal: Negative.   Musculoskeletal: Negative.   Allergic/Immunologic: Negative.   Neurological: Positive for dizziness.       Chronic vertigo  Hematological: Negative.   Psychiatric/Behavioral: Negative.   All other systems reviewed and are negative.   BP 120/80 mmHg  Pulse 58  Ht 6' (1.829 m)  Wt 293 lb 8 oz (133.131 kg)  BMI 39.80 kg/m2  Physical Exam  Constitutional: He is  oriented to person, place, and time. He appears well-developed and well-nourished.  obese  HENT:  Head: Normocephalic.  Nose: Nose normal.  Mouth/Throat: Oropharynx is clear and moist.  Eyes: Conjunctivae are normal. Pupils are equal, round, and reactive to light.  Neck: Normal range of motion. Neck supple. No JVD present.  Cardiovascular: Normal rate, regular rhythm, S1 normal, S2 normal, normal heart sounds and intact distal pulses.  Exam reveals no gallop and no friction rub.   No murmur heard. Pulmonary/Chest: Effort normal and breath sounds normal. No respiratory distress. He has no wheezes. He has no rales. He exhibits no tenderness.  Abdominal: Soft. Bowel sounds are normal. He exhibits no distension. There is no tenderness.  Musculoskeletal: Normal range of motion. He exhibits no edema or tenderness.  Lymphadenopathy:    He has no cervical adenopathy.  Neurological: He is alert and oriented to person, place, and time. Coordination normal.  Skin: Skin is warm and dry. No rash noted. No erythema.  Psychiatric: He has a normal mood and affect. His behavior is normal. Judgment and thought content normal.      Assessment and Plan   Nursing note and vitals reviewed.

## 2015-01-27 NOTE — Assessment & Plan Note (Signed)
Maintaining normal sinus rhythm, rare episodes of arrhythmia, Improvement following compliance with his CPAP No medication changes made

## 2015-01-27 NOTE — Assessment & Plan Note (Signed)
Blood pressure is well controlled on today's visit. No changes made to the medications. 

## 2015-01-27 NOTE — Assessment & Plan Note (Signed)
Compliant with his CPAP. 

## 2015-01-27 NOTE — Assessment & Plan Note (Addendum)
Followed by hematology, periodic phlebotomy Does not appear to have cardiac manifestations at this time though will monitor closely Echocardiogram in 2015, normal ejection fraction

## 2015-01-27 NOTE — Patient Instructions (Signed)
You are doing well. No medication changes were made.  Xarelto coupon  Please call us if you have new issues that need to be addressed before your next appt.  Your physician wants you to follow-up in: 6 months.  You will receive a reminder letter in the mail two months in advance. If you don't receive a letter, please call our office to schedule the follow-up appointment.

## 2015-01-28 ENCOUNTER — Ambulatory Visit: Payer: Commercial Managed Care - HMO

## 2015-01-28 ENCOUNTER — Telehealth: Payer: Self-pay | Admitting: *Deleted

## 2015-01-28 NOTE — Telephone Encounter (Signed)
Asking if he can phlebotomies weekly instead of every three weeks until he gets to normal lab or feels bad form it

## 2015-01-28 NOTE — Telephone Encounter (Signed)
That's a lot, but if he's tolerating it, sure.  Please add a hbg with every blood draw to ensure we are not making him anemic.

## 2015-01-29 NOTE — Telephone Encounter (Signed)
Informed Collin Holt that scheduler should be calling them with weekly appts

## 2015-01-30 ENCOUNTER — Inpatient Hospital Stay: Payer: Commercial Managed Care - HMO | Attending: Oncology

## 2015-01-30 ENCOUNTER — Inpatient Hospital Stay: Payer: Commercial Managed Care - HMO

## 2015-01-30 DIAGNOSIS — G4733 Obstructive sleep apnea (adult) (pediatric): Secondary | ICD-10-CM | POA: Diagnosis not present

## 2015-01-30 DIAGNOSIS — Z79899 Other long term (current) drug therapy: Secondary | ICD-10-CM | POA: Diagnosis not present

## 2015-01-30 DIAGNOSIS — Z87891 Personal history of nicotine dependence: Secondary | ICD-10-CM | POA: Diagnosis not present

## 2015-01-30 DIAGNOSIS — I480 Paroxysmal atrial fibrillation: Secondary | ICD-10-CM | POA: Insufficient documentation

## 2015-01-30 DIAGNOSIS — Z7901 Long term (current) use of anticoagulants: Secondary | ICD-10-CM | POA: Diagnosis not present

## 2015-01-30 DIAGNOSIS — I10 Essential (primary) hypertension: Secondary | ICD-10-CM | POA: Insufficient documentation

## 2015-01-30 LAB — HEMOGLOBIN: Hemoglobin: 18 g/dL (ref 13.0–18.0)

## 2015-01-30 NOTE — Progress Notes (Signed)
Hgb: 18.0. MD, Dr. Grayland Ormond, notified via telephone. Per MD, Dr. Grayland Ormond, order: proceed with phlebotomy at this time. Phlebotomy orders are already in under supportive therapy plan.

## 2015-02-05 ENCOUNTER — Inpatient Hospital Stay: Payer: Commercial Managed Care - HMO

## 2015-02-05 DIAGNOSIS — I480 Paroxysmal atrial fibrillation: Secondary | ICD-10-CM | POA: Diagnosis not present

## 2015-02-05 DIAGNOSIS — G4733 Obstructive sleep apnea (adult) (pediatric): Secondary | ICD-10-CM | POA: Diagnosis not present

## 2015-02-05 DIAGNOSIS — I10 Essential (primary) hypertension: Secondary | ICD-10-CM | POA: Diagnosis not present

## 2015-02-05 DIAGNOSIS — Z79899 Other long term (current) drug therapy: Secondary | ICD-10-CM | POA: Diagnosis not present

## 2015-02-05 DIAGNOSIS — Z7901 Long term (current) use of anticoagulants: Secondary | ICD-10-CM | POA: Diagnosis not present

## 2015-02-05 DIAGNOSIS — Z87891 Personal history of nicotine dependence: Secondary | ICD-10-CM | POA: Diagnosis not present

## 2015-02-05 LAB — HEMOGLOBIN: Hemoglobin: 16.8 g/dL (ref 13.0–18.0)

## 2015-02-12 ENCOUNTER — Inpatient Hospital Stay: Payer: Commercial Managed Care - HMO

## 2015-02-12 DIAGNOSIS — Z7901 Long term (current) use of anticoagulants: Secondary | ICD-10-CM | POA: Diagnosis not present

## 2015-02-12 DIAGNOSIS — Z87891 Personal history of nicotine dependence: Secondary | ICD-10-CM | POA: Diagnosis not present

## 2015-02-12 DIAGNOSIS — G4733 Obstructive sleep apnea (adult) (pediatric): Secondary | ICD-10-CM | POA: Diagnosis not present

## 2015-02-12 DIAGNOSIS — I10 Essential (primary) hypertension: Secondary | ICD-10-CM | POA: Diagnosis not present

## 2015-02-12 DIAGNOSIS — I480 Paroxysmal atrial fibrillation: Secondary | ICD-10-CM | POA: Diagnosis not present

## 2015-02-12 DIAGNOSIS — Z79899 Other long term (current) drug therapy: Secondary | ICD-10-CM | POA: Diagnosis not present

## 2015-02-12 LAB — CBC WITH DIFFERENTIAL/PLATELET
BASOS ABS: 0.1 10*3/uL (ref 0–0.1)
BASOS PCT: 1 %
EOS ABS: 0.3 10*3/uL (ref 0–0.7)
Eosinophils Relative: 4 %
HEMATOCRIT: 48.8 % (ref 40.0–52.0)
Hemoglobin: 16.8 g/dL (ref 13.0–18.0)
Lymphocytes Relative: 32 %
Lymphs Abs: 2.1 10*3/uL (ref 1.0–3.6)
MCH: 32.8 pg (ref 26.0–34.0)
MCHC: 34.4 g/dL (ref 32.0–36.0)
MCV: 95.4 fL (ref 80.0–100.0)
MONO ABS: 0.6 10*3/uL (ref 0.2–1.0)
Monocytes Relative: 10 %
NEUTROS ABS: 3.4 10*3/uL (ref 1.4–6.5)
Neutrophils Relative %: 53 %
PLATELETS: 199 10*3/uL (ref 150–440)
RBC: 5.11 MIL/uL (ref 4.40–5.90)
RDW: 13 % (ref 11.5–14.5)
WBC: 6.4 10*3/uL (ref 3.8–10.6)

## 2015-02-12 LAB — IRON AND TIBC
Iron: 127 ug/dL (ref 45–182)
SATURATION RATIOS: 35 % (ref 17.9–39.5)
TIBC: 368 ug/dL (ref 250–450)
UIBC: 241 ug/dL

## 2015-02-12 LAB — FERRITIN: FERRITIN: 192 ng/mL (ref 24–336)

## 2015-02-13 DIAGNOSIS — Z79899 Other long term (current) drug therapy: Secondary | ICD-10-CM | POA: Diagnosis not present

## 2015-02-13 DIAGNOSIS — Z13220 Encounter for screening for lipoid disorders: Secondary | ICD-10-CM | POA: Diagnosis not present

## 2015-02-13 DIAGNOSIS — Z125 Encounter for screening for malignant neoplasm of prostate: Secondary | ICD-10-CM | POA: Diagnosis not present

## 2015-02-13 DIAGNOSIS — E291 Testicular hypofunction: Secondary | ICD-10-CM | POA: Diagnosis not present

## 2015-02-13 DIAGNOSIS — I10 Essential (primary) hypertension: Secondary | ICD-10-CM | POA: Diagnosis not present

## 2015-02-13 NOTE — Telephone Encounter (Signed)
This encounter was created in error - please disregard.

## 2015-02-17 DIAGNOSIS — E291 Testicular hypofunction: Secondary | ICD-10-CM | POA: Diagnosis not present

## 2015-02-17 DIAGNOSIS — Z79899 Other long term (current) drug therapy: Secondary | ICD-10-CM | POA: Diagnosis not present

## 2015-02-17 DIAGNOSIS — I480 Paroxysmal atrial fibrillation: Secondary | ICD-10-CM | POA: Diagnosis not present

## 2015-02-17 DIAGNOSIS — Z125 Encounter for screening for malignant neoplasm of prostate: Secondary | ICD-10-CM | POA: Diagnosis not present

## 2015-02-17 DIAGNOSIS — J069 Acute upper respiratory infection, unspecified: Secondary | ICD-10-CM | POA: Diagnosis not present

## 2015-02-17 DIAGNOSIS — F3341 Major depressive disorder, recurrent, in partial remission: Secondary | ICD-10-CM | POA: Diagnosis not present

## 2015-02-17 DIAGNOSIS — I10 Essential (primary) hypertension: Secondary | ICD-10-CM | POA: Diagnosis not present

## 2015-02-19 ENCOUNTER — Inpatient Hospital Stay: Payer: Commercial Managed Care - HMO

## 2015-02-26 ENCOUNTER — Inpatient Hospital Stay: Payer: Commercial Managed Care - HMO

## 2015-02-26 ENCOUNTER — Inpatient Hospital Stay: Payer: Commercial Managed Care - HMO | Attending: Oncology

## 2015-02-26 DIAGNOSIS — Z79899 Other long term (current) drug therapy: Secondary | ICD-10-CM | POA: Diagnosis not present

## 2015-02-26 LAB — HEMOGLOBIN: HEMOGLOBIN: 17 g/dL (ref 13.0–18.0)

## 2015-02-28 DIAGNOSIS — K760 Fatty (change of) liver, not elsewhere classified: Secondary | ICD-10-CM | POA: Diagnosis not present

## 2015-03-05 ENCOUNTER — Inpatient Hospital Stay: Payer: Commercial Managed Care - HMO

## 2015-03-05 DIAGNOSIS — Z79899 Other long term (current) drug therapy: Secondary | ICD-10-CM | POA: Diagnosis not present

## 2015-03-05 LAB — HEMOGLOBIN: HEMOGLOBIN: 16.4 g/dL (ref 13.0–18.0)

## 2015-03-05 NOTE — Progress Notes (Signed)
Hgb: 16.4. MD, Dr. Grayland Ormond, notified via telephone. Proceed with phlebotomy today per MD, Dr. Grayland Ormond, order.

## 2015-03-14 DIAGNOSIS — H833X3 Noise effects on inner ear, bilateral: Secondary | ICD-10-CM | POA: Diagnosis not present

## 2015-03-14 DIAGNOSIS — H903 Sensorineural hearing loss, bilateral: Secondary | ICD-10-CM | POA: Diagnosis not present

## 2015-03-14 DIAGNOSIS — H8110 Benign paroxysmal vertigo, unspecified ear: Secondary | ICD-10-CM | POA: Insufficient documentation

## 2015-03-14 DIAGNOSIS — Z9989 Dependence on other enabling machines and devices: Secondary | ICD-10-CM | POA: Diagnosis not present

## 2015-03-14 DIAGNOSIS — R2689 Other abnormalities of gait and mobility: Secondary | ICD-10-CM | POA: Diagnosis not present

## 2015-03-14 DIAGNOSIS — G43909 Migraine, unspecified, not intractable, without status migrainosus: Secondary | ICD-10-CM | POA: Diagnosis not present

## 2015-03-14 DIAGNOSIS — H9313 Tinnitus, bilateral: Secondary | ICD-10-CM | POA: Diagnosis not present

## 2015-03-14 DIAGNOSIS — I480 Paroxysmal atrial fibrillation: Secondary | ICD-10-CM | POA: Diagnosis not present

## 2015-03-14 DIAGNOSIS — G4733 Obstructive sleep apnea (adult) (pediatric): Secondary | ICD-10-CM | POA: Diagnosis not present

## 2015-03-14 DIAGNOSIS — R42 Dizziness and giddiness: Secondary | ICD-10-CM | POA: Diagnosis not present

## 2015-03-14 DIAGNOSIS — IMO0002 Reserved for concepts with insufficient information to code with codable children: Secondary | ICD-10-CM | POA: Insufficient documentation

## 2015-03-19 ENCOUNTER — Inpatient Hospital Stay: Payer: Commercial Managed Care - HMO | Attending: Oncology

## 2015-03-19 ENCOUNTER — Inpatient Hospital Stay: Payer: Commercial Managed Care - HMO

## 2015-03-19 DIAGNOSIS — Z7901 Long term (current) use of anticoagulants: Secondary | ICD-10-CM | POA: Insufficient documentation

## 2015-03-19 DIAGNOSIS — Z79899 Other long term (current) drug therapy: Secondary | ICD-10-CM | POA: Insufficient documentation

## 2015-03-19 DIAGNOSIS — I10 Essential (primary) hypertension: Secondary | ICD-10-CM | POA: Insufficient documentation

## 2015-03-19 DIAGNOSIS — E785 Hyperlipidemia, unspecified: Secondary | ICD-10-CM | POA: Insufficient documentation

## 2015-03-19 DIAGNOSIS — G4733 Obstructive sleep apnea (adult) (pediatric): Secondary | ICD-10-CM | POA: Insufficient documentation

## 2015-03-19 DIAGNOSIS — Z87891 Personal history of nicotine dependence: Secondary | ICD-10-CM | POA: Insufficient documentation

## 2015-03-19 DIAGNOSIS — I480 Paroxysmal atrial fibrillation: Secondary | ICD-10-CM | POA: Insufficient documentation

## 2015-03-26 ENCOUNTER — Inpatient Hospital Stay: Payer: Commercial Managed Care - HMO

## 2015-03-26 DIAGNOSIS — Z7901 Long term (current) use of anticoagulants: Secondary | ICD-10-CM | POA: Diagnosis not present

## 2015-03-26 DIAGNOSIS — Z79899 Other long term (current) drug therapy: Secondary | ICD-10-CM | POA: Diagnosis not present

## 2015-03-26 DIAGNOSIS — I10 Essential (primary) hypertension: Secondary | ICD-10-CM | POA: Diagnosis not present

## 2015-03-26 DIAGNOSIS — I480 Paroxysmal atrial fibrillation: Secondary | ICD-10-CM | POA: Diagnosis not present

## 2015-03-26 DIAGNOSIS — G4733 Obstructive sleep apnea (adult) (pediatric): Secondary | ICD-10-CM | POA: Diagnosis not present

## 2015-03-26 DIAGNOSIS — Z87891 Personal history of nicotine dependence: Secondary | ICD-10-CM | POA: Diagnosis not present

## 2015-03-26 DIAGNOSIS — E785 Hyperlipidemia, unspecified: Secondary | ICD-10-CM | POA: Diagnosis not present

## 2015-03-26 LAB — HEMOGLOBIN: Hemoglobin: 17 g/dL (ref 13.0–18.0)

## 2015-03-26 NOTE — Progress Notes (Signed)
Hgb: 17.0. MD, Dr. Grayland Ormond, notified via telephone. Per MD, Dr. Grayland Ormond, order: release phlebotomy order from supportive therapy plan and perform phlebotomy today.

## 2015-04-02 ENCOUNTER — Other Ambulatory Visit: Payer: Self-pay | Admitting: *Deleted

## 2015-04-02 ENCOUNTER — Inpatient Hospital Stay: Payer: Commercial Managed Care - HMO

## 2015-04-02 ENCOUNTER — Encounter: Payer: Self-pay | Admitting: *Deleted

## 2015-04-02 DIAGNOSIS — I480 Paroxysmal atrial fibrillation: Secondary | ICD-10-CM | POA: Diagnosis not present

## 2015-04-02 DIAGNOSIS — Z79899 Other long term (current) drug therapy: Secondary | ICD-10-CM | POA: Diagnosis not present

## 2015-04-02 DIAGNOSIS — E785 Hyperlipidemia, unspecified: Secondary | ICD-10-CM | POA: Diagnosis not present

## 2015-04-02 DIAGNOSIS — G4733 Obstructive sleep apnea (adult) (pediatric): Secondary | ICD-10-CM | POA: Diagnosis not present

## 2015-04-02 DIAGNOSIS — I10 Essential (primary) hypertension: Secondary | ICD-10-CM | POA: Diagnosis not present

## 2015-04-02 DIAGNOSIS — Z7901 Long term (current) use of anticoagulants: Secondary | ICD-10-CM | POA: Diagnosis not present

## 2015-04-02 DIAGNOSIS — Z87891 Personal history of nicotine dependence: Secondary | ICD-10-CM | POA: Diagnosis not present

## 2015-04-02 LAB — HEMOGLOBIN: Hemoglobin: 16.9 g/dL (ref 13.0–18.0)

## 2015-04-02 NOTE — Progress Notes (Signed)
Hgb: 16.9. MD, Dr. Rogue Bussing, notified via telephone. Per MD, Dr. Rogue Bussing, order: proceed with phlebotomy today. Use current order for phlebotomy that is present in the supportive therapy plan.

## 2015-04-02 NOTE — Progress Notes (Signed)
Received call from [facility] in infusion. Pt does not have any perimeters for the therapeutic phlebotomy. hgb today was 16.9.  Dr. Rogue Bussing approved phlebotomy and asked that a ferritin level be added to orders next week. Ferritin order added per md order.

## 2015-04-09 ENCOUNTER — Inpatient Hospital Stay (HOSPITAL_BASED_OUTPATIENT_CLINIC_OR_DEPARTMENT_OTHER): Payer: Commercial Managed Care - HMO | Admitting: Oncology

## 2015-04-09 ENCOUNTER — Inpatient Hospital Stay: Payer: Commercial Managed Care - HMO

## 2015-04-09 DIAGNOSIS — Z79899 Other long term (current) drug therapy: Secondary | ICD-10-CM | POA: Diagnosis not present

## 2015-04-09 DIAGNOSIS — I10 Essential (primary) hypertension: Secondary | ICD-10-CM

## 2015-04-09 DIAGNOSIS — Z87891 Personal history of nicotine dependence: Secondary | ICD-10-CM | POA: Diagnosis not present

## 2015-04-09 DIAGNOSIS — G4733 Obstructive sleep apnea (adult) (pediatric): Secondary | ICD-10-CM | POA: Diagnosis not present

## 2015-04-09 DIAGNOSIS — E785 Hyperlipidemia, unspecified: Secondary | ICD-10-CM | POA: Diagnosis not present

## 2015-04-09 DIAGNOSIS — Z7901 Long term (current) use of anticoagulants: Secondary | ICD-10-CM | POA: Diagnosis not present

## 2015-04-09 DIAGNOSIS — I480 Paroxysmal atrial fibrillation: Secondary | ICD-10-CM | POA: Diagnosis not present

## 2015-04-09 LAB — FERRITIN: Ferritin: 33 ng/mL (ref 24–336)

## 2015-04-09 LAB — HEMOGLOBIN: Hemoglobin: 16.6 g/dL (ref 13.0–18.0)

## 2015-04-09 NOTE — Progress Notes (Signed)
[place]  [phone] Fax:(336) [phone]  ID: Collin Holt OB: [birth year]  MR#: [id number]  [id number]  Patient Care Team: [name], MD as PCP - General (Internal Medicine) [name], MD as Consulting Physician (Cardiology)  CHIEF COMPLAINT:  Chief Complaint  Patient presents with  . Follow-up    Hemochromatosis     INTERVAL HISTORY: Patient is here for 3 month follow up.  He currently feels well and is asymptomatic. He has no neurologic complaints. He denies any recent fevers or illnesses. He has a good appetite and denies weight loss. He has no chest pain or shortness of breath. He denies any nausea, vomiting, constipation, or diarrhea. He has no urinary complaints. Patient offers no specific complaints today.  REVIEW OF SYSTEMS:   Review of Systems  Constitutional: Negative.  Negative for fever, weight loss and malaise/fatigue.  Respiratory: Negative.   Cardiovascular: Negative.   Gastrointestinal: Negative.  Negative for blood in stool and melena.  Musculoskeletal: Negative.   Skin: Negative for itching.  Neurological: Negative.  Negative for weakness.    As per HPI. Otherwise, a complete review of systems is negatve.  PAST MEDICAL HISTORY: Past Medical History  Diagnosis Date  . PAF (paroxysmal atrial fibrillation) ([place])     a. 2012 Echo: nl LV fxn, mod dil LA.  Marland Kitchen HTN (hypertension)   . Borderline hyperlipidemia   . Chronic headaches   . DOE (dyspnea on exertion)     a. 02/2010 Cath: nl cors;  b. 02/2010 CPX: good fxnl capacity.  . OSA (obstructive sleep apnea)     PAST SURGICAL HISTORY: Past Surgical History  Procedure Laterality Date  . Shoulder surgery      Right x 2    FAMILY HISTORY Family History  Problem Relation Age of Onset  . Emphysema Father   . Cancer Father   . Prostate cancer Neg Hx   . Colon cancer Neg Hx   . Coronary artery disease Neg Hx   . Diabetes Neg Hx        ADVANCED  DIRECTIVES:    HEALTH MAINTENANCE: Social History  Substance Use Topics  . Smoking status: Former Smoker -- 1.00 packs/day for 10 years    Types: Cigarettes    Quit date: 01/19/1999  . Smokeless tobacco: Former Systems developer  . Alcohol Use: Yes     Comment: wine on and off 2 x's week     Allergies  Allergen Reactions  . Morphine And Related Nausea And Vomiting  . Terfenadine Other (See Comments)    Current Outpatient Prescriptions  Medication Sig Dispense Refill  . cyanocobalamin (,VITAMIN B-12,) 1000 MCG/ML injection Inject 100 mcg into the muscle every 30 (thirty) days.     . metoprolol succinate (TOPROL-XL) 50 MG 24 hr tablet Take 1 tablet (50 mg total) by mouth daily. Take with or immediately following a meal. 30 tablet 11  . rivaroxaban (XARELTO) 20 MG TABS tablet Take 1 tablet (20 mg total) by mouth daily with supper. 30 tablet 11  . testosterone cypionate (DEPOTESTOSTERONE CYPIONATE) 200 MG/ML injection Inject 200 mg into the muscle every 14 (fourteen) days.     . tizanidine (ZANAFLEX) 2 MG capsule Take 2 mg by mouth as needed for muscle spasms.    Marland Kitchen venlafaxine (EFFEXOR) 75 MG tablet Take 75 mg by mouth daily.       Current Facility-Administered Medications  Medication Dose Route Frequency Provider Last Rate Last Dose  . promethazine (PHENERGAN) injection 25  mg  25 mg Intravenous Q6H PRN Janith Lima, MD   25 mg at 05/01/10 1209    OBJECTIVE: Filed Vitals:   04/09/15 1431  BP: 152/85  Pulse: 59  Temp: 97.7 F (36.5 C)  Resp: 18     Body mass index is 38.89 kg/(m^2).    ECOG FS:0 - Asymptomatic  General: Well-developed, well-nourished, no acute distress. Eyes: Pink conjunctiva, anicteric sclera. Lungs: Clear to auscultation bilaterally. Heart: Regular rate and rhythm. No rubs, murmurs, or gallops. Abdomen: Soft, nontender, nondistended. No organomegaly noted, normoactive bowel sounds. Musculoskeletal: No edema, cyanosis, or clubbing. Neuro: Alert, answering all  questions appropriately. Cranial nerves grossly intact. Skin: No rashes or petechiae noted. Psych: Normal affect.   LAB RESULTS:  Lab Results  Component Value Date   NA 139 07/20/2013   K 4.2 07/20/2013   CL 105 07/20/2013   CO2 26 07/20/2013   GLUCOSE 104* 07/20/2013   BUN 11 07/20/2013   CREATININE 0.95 07/20/2013   CALCIUM 9.0 07/20/2013   PROT 7.4 07/19/2013   ALBUMIN 3.7 07/19/2013   AST 29 07/19/2013   ALT 32 07/19/2013   ALKPHOS 65 07/19/2013   BILITOT 1.1* 07/19/2013   GFRNONAA >60 07/20/2013   GFRAA >60 07/20/2013    Lab Results  Component Value Date   WBC 6.4 02/12/2015   NEUTROABS 3.4 02/12/2015   HGB 16.6 04/09/2015   HCT 48.8 02/12/2015   MCV 95.4 02/12/2015   PLT 199 02/12/2015   Lab Results  Component Value Date   FERRITIN 33 04/09/2015     STUDIES: No results found.  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  PLAN:    1. Hemachromatosis: Ferritin today is 33. No phlebotomy needed today.  Goal ferritin will be between 50 and 100. Patient will return to clinic in 3 months with repeat laboratory work and further evaluation.   2. Atrial fibrillation: Continue Xarelto as ordered. 3. Hypertension: Patient's blood pressure mildly elevated today, continue current medications as prescribed.    Mayra Reel, NP   04/09/2015 2:58 PM  Patient was seen and evaluated independently and I agree with the assessment and plan as dictated above.  [name], MD 04/11/2015 5:50 AM

## 2015-04-09 NOTE — Progress Notes (Signed)
No changes since last visit. 

## 2015-04-28 ENCOUNTER — Ambulatory Visit: Payer: Commercial Managed Care - HMO | Admitting: Psychiatry

## 2015-07-10 ENCOUNTER — Inpatient Hospital Stay: Payer: Commercial Managed Care - HMO | Attending: Oncology

## 2015-07-10 ENCOUNTER — Inpatient Hospital Stay (HOSPITAL_BASED_OUTPATIENT_CLINIC_OR_DEPARTMENT_OTHER): Payer: Commercial Managed Care - HMO | Admitting: Oncology

## 2015-07-10 ENCOUNTER — Telehealth: Payer: Self-pay | Admitting: Cardiovascular Disease

## 2015-07-10 ENCOUNTER — Inpatient Hospital Stay: Payer: Commercial Managed Care - HMO

## 2015-07-10 DIAGNOSIS — E785 Hyperlipidemia, unspecified: Secondary | ICD-10-CM | POA: Diagnosis not present

## 2015-07-10 DIAGNOSIS — G4733 Obstructive sleep apnea (adult) (pediatric): Secondary | ICD-10-CM | POA: Insufficient documentation

## 2015-07-10 DIAGNOSIS — I480 Paroxysmal atrial fibrillation: Secondary | ICD-10-CM | POA: Diagnosis not present

## 2015-07-10 DIAGNOSIS — I10 Essential (primary) hypertension: Secondary | ICD-10-CM | POA: Insufficient documentation

## 2015-07-10 DIAGNOSIS — Z7901 Long term (current) use of anticoagulants: Secondary | ICD-10-CM | POA: Insufficient documentation

## 2015-07-10 DIAGNOSIS — Z87891 Personal history of nicotine dependence: Secondary | ICD-10-CM | POA: Diagnosis not present

## 2015-07-10 DIAGNOSIS — Z79899 Other long term (current) drug therapy: Secondary | ICD-10-CM | POA: Diagnosis not present

## 2015-07-10 LAB — HEMOGLOBIN: Hemoglobin: 17.7 g/dL (ref 13.0–18.0)

## 2015-07-10 LAB — FERRITIN: Ferritin: 61 ng/mL (ref 24–336)

## 2015-07-10 NOTE — Telephone Encounter (Signed)
Pt is due for 6 mo f/u. Sched for 08/06/15 @ 11:20. Pt placed on wait list in the event of a cancellation.

## 2015-07-10 NOTE — Progress Notes (Signed)
States has not been feeling well lately. Has been having symptoms for dizziness, fatigue, and headaches for the past few weeks. Headaches have been getting worse over the past week.

## 2015-07-10 NOTE — Telephone Encounter (Signed)
Pt wife calling stating last Saturday night he had a bad Afib attack, since then he's been having it on and off.  07/10/15 150/90  He took two metoprolol the night of the first attack  Afib comes and goes now.  Would like to know what can we do, if patient needs to come in sooner.  Please advise

## 2015-07-18 ENCOUNTER — Ambulatory Visit (INDEPENDENT_AMBULATORY_CARE_PROVIDER_SITE_OTHER): Payer: Commercial Managed Care - HMO | Admitting: Cardiovascular Disease

## 2015-07-18 ENCOUNTER — Encounter: Payer: Self-pay | Admitting: Cardiovascular Disease

## 2015-07-18 VITALS — BP 150/92 | HR 63 | Ht 72.0 in | Wt 296.2 lb

## 2015-07-18 DIAGNOSIS — I4891 Unspecified atrial fibrillation: Secondary | ICD-10-CM | POA: Diagnosis not present

## 2015-07-18 DIAGNOSIS — I10 Essential (primary) hypertension: Secondary | ICD-10-CM

## 2015-07-18 DIAGNOSIS — G4733 Obstructive sleep apnea (adult) (pediatric): Secondary | ICD-10-CM

## 2015-07-18 MED ORDER — RIVAROXABAN 20 MG PO TABS: 20.0000 mg | ORAL_TABLET | Freq: Every day | ORAL | Status: DC

## 2015-07-18 MED ORDER — METOPROLOL SUCCINATE ER 50 MG PO TB24: 50.0000 mg | ORAL_TABLET | Freq: Every day | ORAL | Status: DC

## 2015-07-18 MED ORDER — FUROSEMIDE 20 MG PO TABS: 20.0000 mg | ORAL_TABLET | Freq: Two times a day (BID) | ORAL | Status: DC | PRN

## 2015-07-18 MED ORDER — AMIODARONE HCL 200 MG PO TABS: 200.0000 mg | ORAL_TABLET | Freq: Two times a day (BID) | ORAL | Status: DC | PRN

## 2015-07-18 NOTE — Progress Notes (Signed)
Patient ID: Collin Holt, male   DOB: 1960/05/18, 55 y.o.   MRN: [id number] Cardiology Office Note  Date:  07/18/2015   ID:  Collin Holt, DOB 08/22/1960, MRN [id number]  PCP:  Collin Crouch, MD   Chief Complaint  Patient presents with  . other    6 month follow up. Meds reviewed by the patient verbally. Pt. c/o HTN, A-Fib and shortness of breath with breaking out into a sweat.     HPI:  Collin Holt is a 55 year old gentleman with obesity, obstructive sleep apnea, paroxysmal atrial fibrillation, long history of vertigo, periodic alcohol use who is on disability for headaches and severe vertigo, recently in the hospital July 2015 for atrial fibrillation. He was given amiodarone in the hospital and converted to normal sinus rhythm shortly after discharge. Prior cardiac catheterization every 2012 showing no significant CAD Prior echocardiogram 01/2010 showing normal ejection fraction He presents today for follow-up of his atrial fibrillation History of sleep apnea, compliant with his CPAP  In follow-up today, he reports that he is taking metoprolol one half pill per day, xarelto one half pill per day Weight is up 10 pounds from prior clinic visit, he is having increasing frequency of atrial fibrillation episodes Wife presents with him today and reports he is having increased palpitations, Often 2 or 3 times per week lasting several hours at a time Continues to have significant alcohol problems, drinking to excess Recent episode after drinking heavy alcohol on Father's Day, that evening had significant atrial fibrillation  EKG on today's visit shows normal sinus rhythm with rate 63 bpm, left anterior fascicular block  he has been compliant with his CPAP, significantly less atrial fibrillation  Other past medical history diagnosis of hemochromatosis, followed by Collin Holt Also told he has a fatty liver, needs to lose weight  on disability for continued severe vertigo when he lays  down in bed, severe tinnitus Denies any significant shortness of breath or chest pain with exertion   PMH:   has a past medical history of PAF (paroxysmal atrial fibrillation) ([place]); HTN (hypertension); Borderline hyperlipidemia; Chronic headaches; DOE (dyspnea on exertion); and OSA (obstructive sleep apnea).  PSH:    Past Surgical History  Procedure Laterality Date  . Shoulder surgery      Right x 2    Current Outpatient Prescriptions  Medication Sig Dispense Refill  . metoprolol succinate (TOPROL-XL) 50 MG 24 hr tablet Take 1 tablet (50 mg total) by mouth daily. Take with or immediately following a meal. 30 tablet 11  . rivaroxaban (XARELTO) 20 MG TABS tablet Take 1 tablet (20 mg total) by mouth daily with supper. 30 tablet 11  . SUMAtriptan (IMITREX) 100 MG tablet Take 1 tablet by mouth daily as needed.    . testosterone cypionate (DEPOTESTOSTERONE CYPIONATE) 200 MG/ML injection Inject 200 mg into the muscle every 14 (fourteen) days.     . tizanidine (ZANAFLEX) 2 MG capsule Take 2 mg by mouth as needed for muscle spasms.    Marland Kitchen venlafaxine (EFFEXOR) 75 MG tablet Take 75 mg by mouth daily.      Marland Kitchen amiodarone (PACERONE) 200 MG tablet Take 1 tablet (200 mg total) by mouth 2 (two) times daily as needed. 60 tablet 6  . furosemide (LASIX) 20 MG tablet Take 1 tablet (20 mg total) by mouth 2 (two) times daily as needed. 60 tablet 3   Current Facility-Administered Medications  Medication Dose Route Frequency Provider Last Rate Last Dose  . promethazine (PHENERGAN) injection  25 mg  25 mg Intravenous Q6H PRN Collin Lima, MD   25 mg at 05/01/10 1209     Allergies:   Morphine and related and Terfenadine   Social History:  The patient  reports that he quit smoking about 16 years ago. His smoking use included Cigarettes. He has a 10 pack-year smoking history. He has quit using smokeless tobacco. He reports that he drinks alcohol. He reports that he does not use illicit drugs.   Family History:    family history includes Cancer in his father; Emphysema in his father. There is no history of Prostate cancer, Colon cancer, Coronary artery disease, or Diabetes.    Review of Systems: Review of Systems  Constitutional: Negative.   Respiratory: Negative.   Cardiovascular: Positive for palpitations.       Tachycardia  Gastrointestinal: Negative.   Musculoskeletal: Negative.   Neurological: Negative.   Psychiatric/Behavioral: Negative.   All other systems reviewed and are negative.    PHYSICAL EXAM: VS:  BP 150/92 mmHg  Pulse 63  Ht 6' (1.829 m)  Wt 296 lb 4 oz (134.378 kg)  BMI 40.17 kg/m2 , BMI Body mass index is 40.17 kg/(m^2). GEN: Well nourished, well developed, in no acute distress, obese HEENT: normal Neck: no JVD, carotid bruits, or masses Cardiac: RRR; no murmurs, rubs, or gallops,no edema  Respiratory:  clear to auscultation bilaterally, normal work of breathing GI: soft, nontender, nondistended, + BS MS: no deformity or atrophy Skin: warm and dry, no rash Neuro:  Strength and sensation are intact Psych: euthymic mood, full affect    Recent Labs: 02/12/2015: Platelets 199 07/10/2015: Hemoglobin 17.7    Lipid Panel Lab Results  Component Value Date   CHOL 195 10/23/2007   HDL 32.4 10/23/2007   LDLCALC 84.2 10/23/2007   TRIG 392 10/23/2007      Wt Readings from Last 3 Encounters:  07/18/15 296 lb 4 oz (134.378 kg)  07/10/15 295 lb 6.7 oz (134 kg)  04/09/15 286 lb 13.1 oz (130.1 kg)       ASSESSMENT AND PLAN:  Atrial fibrillation, unspecified type ([place]) - Plan: EKG 12-Lead Recommended he be compliant with full dose xarelto 20 mg daily Increase metoprolol succinate back to 50 mg daily Suggested he take amiodarone 1-2 pills as needed for breakthrough atrial fibrillation He does not want to take this on a regular basis Also does not want higher dose metoprolol  HYPERTENSION, BENIGN Blood pressure elevated, recommend higher dose metoprolol, less  alcohol, weight loss Suggested he monitor blood pressure at home and call our office with numbers  Obstructive sleep apnea Reports he is compliant with his CPAP  Morbid obesity due to excess calories ([place]) We have encouraged continued exercise, careful diet management in an effort to lose weight.  Long discussion concerning his medications, need for medication compliance, options for his atrial fibrillation  Total encounter time more than 25 minutes  Greater than 50% was spent in counseling and coordination of care with the patient   Disposition:   F/U  6 months   Orders Placed This Encounter  Procedures  . EKG 12-Lead     Signed, Esmond Plants, M.D., Ph.D. 07/18/2015  [place], [place]

## 2015-07-18 NOTE — Patient Instructions (Addendum)
Medication Instructions:   Please go back on full xarelto one a day Go back on full metoprolol 50 daily  If you have a fib spell, Take amiodarone 2 pills (electrical pill) Wait 2 to 3 hours, If still in atrial fib, Take another amiodarone 2 pills  For shortness of breath, Take lasix/furosemide one pill as need Take with potassium   Follow-Up: It was a pleasure seeing you in the office today. Please call us if you have new issues that need to be addressed before your next appt.  [phone]  Your physician wants you to follow-up in: 6 months.  You will receive a reminder letter in the mail two months in advance. If you don't receive a letter, please call our office to schedule the follow-up appointment.  If you need a refill on your cardiac medications before your next appointment, please call your pharmacy.

## 2015-07-20 NOTE — Progress Notes (Signed)
[facility]  Telephone:([phone]) [phone] [phone]) [phone]  ID: Collin Holt OB: December 04, 1960  MR#: [id number]  [id number]  Patient Care Team: [name], MD as PCP - General (Internal Medicine) [name], MD as Consulting Physician (Cardiology)  CHIEF COMPLAINT:  Chief Complaint  Patient presents with  . Hemochromatosis     INTERVAL HISTORY: Patient returns to clinic for routine follow-up and consideration of additional phlebotomy. Over the past several weeks he has noted increased dizziness, weakness, and headaches. He has otherwise felt well. He has no other neurologic complaints. He denies any recent fevers or illnesses. He has a good appetite and denies weight loss. He has no chest pain or shortness of breath. He denies any nausea, vomiting, constipation, or diarrhea. He has no urinary complaints. Patient offers no further specific complaints today.  REVIEW OF SYSTEMS:   Review of Systems  Constitutional: Negative for fever, weight loss and malaise/fatigue.  Respiratory: Negative.  Negative for cough and shortness of breath.   Cardiovascular: Negative.  Negative for chest pain.  Gastrointestinal: Negative.  Negative for abdominal pain, blood in stool and melena.  Genitourinary: Negative.   Musculoskeletal: Negative.   Skin: Negative for itching.  Neurological: Positive for dizziness, weakness and headaches.  Psychiatric/Behavioral: Negative.     As per HPI. Otherwise, a complete review of systems is negatve.  PAST MEDICAL HISTORY: Past Medical History  Diagnosis Date  . PAF (paroxysmal atrial fibrillation) ([place])     a. 2012 Echo: nl LV fxn, mod dil LA.  Marland Kitchen HTN (hypertension)   . Borderline hyperlipidemia   . Chronic headaches   . DOE (dyspnea on exertion)     a. 02/2010 Cath: nl cors;  b. 02/2010 CPX: good fxnl capacity.  . OSA (obstructive sleep apnea)     PAST SURGICAL HISTORY: Past Surgical History  Procedure Laterality Date  .  Shoulder surgery      Right x 2    FAMILY HISTORY Family History  Problem Relation Age of Onset  . Emphysema Father   . Cancer Father   . Prostate cancer Neg Hx   . Colon cancer Neg Hx   . Coronary artery disease Neg Hx   . Diabetes Neg Hx        ADVANCED DIRECTIVES:    HEALTH MAINTENANCE: Social History  Substance Use Topics  . Smoking status: Former Smoker -- 1.00 packs/day for 10 years    Types: Cigarettes    Quit date: 01/19/1999  . Smokeless tobacco: Former Systems developer  . Alcohol Use: Yes     Comment: wine on and off 2 x's week     Allergies  Allergen Reactions  . Morphine And Related Nausea And Vomiting  . Terfenadine Other (See Comments)    Current Outpatient Prescriptions  Medication Sig Dispense Refill  . SUMAtriptan (IMITREX) 100 MG tablet Take 1 tablet by mouth daily as needed.    . testosterone cypionate (DEPOTESTOSTERONE CYPIONATE) 200 MG/ML injection Inject 200 mg into the muscle every 14 (fourteen) days.     . tizanidine (ZANAFLEX) 2 MG capsule Take 2 mg by mouth as needed for muscle spasms.    Marland Kitchen venlafaxine (EFFEXOR) 75 MG tablet Take 75 mg by mouth daily.      Marland Kitchen amiodarone (PACERONE) 200 MG tablet Take 1 tablet (200 mg total) by mouth 2 (two) times daily as needed. 60 tablet 6  . furosemide (LASIX) 20 MG tablet Take 1 tablet (20 mg total) by mouth 2 (two) times daily  as needed. 60 tablet 3  . metoprolol succinate (TOPROL-XL) 50 MG 24 hr tablet Take 1 tablet (50 mg total) by mouth daily. Take with or immediately following a meal. 30 tablet 11  . rivaroxaban (XARELTO) 20 MG TABS tablet Take 1 tablet (20 mg total) by mouth daily with supper. 30 tablet 11   Current Facility-Administered Medications  Medication Dose Route Frequency Provider Last Rate Last Dose  . promethazine (PHENERGAN) injection 25 mg  25 mg Intravenous Q6H PRN Janith Lima, MD   25 mg at 05/01/10 1209    OBJECTIVE: Filed Vitals:   07/10/15 1422  BP: 150/95  Pulse: 68  Temp: 98.1 F  (36.7 C)  Resp: 18     Body mass index is 40.06 kg/(m^2).    ECOG FS:0 - Asymptomatic  General: Well-developed, well-nourished, no acute distress. Eyes: Pink conjunctiva, anicteric sclera. Lungs: Clear to auscultation bilaterally. Heart: Regular rate and rhythm. No rubs, murmurs, or gallops. Abdomen: Soft, nontender, nondistended. No organomegaly noted, normoactive bowel sounds. Musculoskeletal: No edema, cyanosis, or clubbing. Neuro: Alert, answering all questions appropriately. Cranial nerves grossly intact. Skin: No rashes or petechiae noted. Psych: Normal affect.   LAB RESULTS:  Lab Results  Component Value Date   NA 139 07/20/2013   K 4.2 07/20/2013   CL 105 07/20/2013   CO2 26 07/20/2013   GLUCOSE 104* 07/20/2013   BUN 11 07/20/2013   CREATININE 0.95 07/20/2013   CALCIUM 9.0 07/20/2013   PROT 7.4 07/19/2013   ALBUMIN 3.7 07/19/2013   AST 29 07/19/2013   ALT 32 07/19/2013   ALKPHOS 65 07/19/2013   BILITOT 1.1* 07/19/2013   GFRNONAA >60 07/20/2013   GFRAA >60 07/20/2013    Lab Results  Component Value Date   WBC 6.4 02/12/2015   NEUTROABS 3.4 02/12/2015   HGB 17.7 07/10/2015   HCT 48.8 02/12/2015   MCV 95.4 02/12/2015   PLT 199 02/12/2015   Lab Results  Component Value Date   FERRITIN 61 07/10/2015     STUDIES: No results found.  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  PLAN:    1. Hemachromatosis: Ferritin today is 61. Goal ferritin will be between 50 and 100. Patient appears to only require phlebotomy once or twice per year. Return to clinic in 6 months with repeat laboratory work and further evaluation.   2. Atrial fibrillation: Continue Xarelto as ordered. 3. Hypertension: Patient's blood pressure mildly elevated today, continue current medications as prescribed. 4. Headaches, dizziness: Possibly related to elevated blood pressure or atrial fibrillation. Patient has been instructed to contact both his PCP as well  as his cardiologist.   [name], MD   07/20/2015 7:30 AM

## 2015-08-06 ENCOUNTER — Ambulatory Visit: Payer: Commercial Managed Care - HMO | Admitting: Cardiovascular Disease

## 2015-08-11 DIAGNOSIS — K760 Fatty (change of) liver, not elsewhere classified: Secondary | ICD-10-CM | POA: Diagnosis not present

## 2015-08-11 DIAGNOSIS — R748 Abnormal levels of other serum enzymes: Secondary | ICD-10-CM | POA: Diagnosis not present

## 2015-08-13 DIAGNOSIS — Z23 Encounter for immunization: Secondary | ICD-10-CM | POA: Diagnosis not present

## 2015-08-14 DIAGNOSIS — M791 Myalgia: Secondary | ICD-10-CM | POA: Diagnosis not present

## 2015-08-14 DIAGNOSIS — Z79899 Other long term (current) drug therapy: Secondary | ICD-10-CM | POA: Diagnosis not present

## 2015-08-14 DIAGNOSIS — E291 Testicular hypofunction: Secondary | ICD-10-CM | POA: Diagnosis not present

## 2015-08-14 DIAGNOSIS — Z125 Encounter for screening for malignant neoplasm of prostate: Secondary | ICD-10-CM | POA: Diagnosis not present

## 2015-08-14 DIAGNOSIS — I10 Essential (primary) hypertension: Secondary | ICD-10-CM | POA: Diagnosis not present

## 2015-08-21 DIAGNOSIS — F419 Anxiety disorder, unspecified: Secondary | ICD-10-CM | POA: Diagnosis not present

## 2015-08-21 DIAGNOSIS — E291 Testicular hypofunction: Secondary | ICD-10-CM | POA: Diagnosis not present

## 2015-08-21 DIAGNOSIS — G43109 Migraine with aura, not intractable, without status migrainosus: Secondary | ICD-10-CM | POA: Diagnosis not present

## 2015-08-21 DIAGNOSIS — I10 Essential (primary) hypertension: Secondary | ICD-10-CM | POA: Diagnosis not present

## 2015-08-21 DIAGNOSIS — Z6841 Body Mass Index (BMI) 40.0 and over, adult: Secondary | ICD-10-CM

## 2015-08-21 DIAGNOSIS — L989 Disorder of the skin and subcutaneous tissue, unspecified: Secondary | ICD-10-CM | POA: Diagnosis not present

## 2015-08-21 DIAGNOSIS — K760 Fatty (change of) liver, not elsewhere classified: Secondary | ICD-10-CM | POA: Diagnosis not present

## 2015-08-21 DIAGNOSIS — F3341 Major depressive disorder, recurrent, in partial remission: Secondary | ICD-10-CM | POA: Diagnosis not present

## 2015-08-21 DIAGNOSIS — Z0000 Encounter for general adult medical examination without abnormal findings: Secondary | ICD-10-CM | POA: Diagnosis not present

## 2015-08-28 DIAGNOSIS — Z1211 Encounter for screening for malignant neoplasm of colon: Secondary | ICD-10-CM | POA: Diagnosis not present

## 2015-09-08 DIAGNOSIS — D485 Neoplasm of uncertain behavior of skin: Secondary | ICD-10-CM | POA: Diagnosis not present

## 2015-09-08 DIAGNOSIS — L570 Actinic keratosis: Secondary | ICD-10-CM | POA: Diagnosis not present

## 2015-09-08 DIAGNOSIS — D489 Neoplasm of uncertain behavior, unspecified: Secondary | ICD-10-CM | POA: Diagnosis not present

## 2015-09-08 DIAGNOSIS — Z85828 Personal history of other malignant neoplasm of skin: Secondary | ICD-10-CM | POA: Diagnosis not present

## 2015-09-15 DIAGNOSIS — Z23 Encounter for immunization: Secondary | ICD-10-CM | POA: Diagnosis not present

## 2015-10-27 DIAGNOSIS — G43019 Migraine without aura, intractable, without status migrainosus: Secondary | ICD-10-CM | POA: Diagnosis not present

## 2015-10-27 DIAGNOSIS — G43719 Chronic migraine without aura, intractable, without status migrainosus: Secondary | ICD-10-CM | POA: Diagnosis not present

## 2015-10-27 DIAGNOSIS — M542 Cervicalgia: Secondary | ICD-10-CM | POA: Diagnosis not present

## 2015-10-27 DIAGNOSIS — R51 Headache: Secondary | ICD-10-CM | POA: Diagnosis not present

## 2015-10-27 DIAGNOSIS — M791 Myalgia: Secondary | ICD-10-CM | POA: Diagnosis not present

## 2015-11-04 DIAGNOSIS — J069 Acute upper respiratory infection, unspecified: Secondary | ICD-10-CM | POA: Diagnosis not present

## 2015-11-04 DIAGNOSIS — I480 Paroxysmal atrial fibrillation: Secondary | ICD-10-CM | POA: Diagnosis not present

## 2015-11-14 DIAGNOSIS — Z79899 Other long term (current) drug therapy: Secondary | ICD-10-CM | POA: Diagnosis not present

## 2015-11-14 DIAGNOSIS — R7309 Other abnormal glucose: Secondary | ICD-10-CM | POA: Diagnosis not present

## 2015-11-14 DIAGNOSIS — E349 Endocrine disorder, unspecified: Secondary | ICD-10-CM | POA: Diagnosis not present

## 2015-11-21 DIAGNOSIS — E349 Endocrine disorder, unspecified: Secondary | ICD-10-CM | POA: Diagnosis not present

## 2015-11-21 DIAGNOSIS — Z6841 Body Mass Index (BMI) 40.0 and over, adult: Secondary | ICD-10-CM | POA: Diagnosis not present

## 2015-11-21 DIAGNOSIS — G4733 Obstructive sleep apnea (adult) (pediatric): Secondary | ICD-10-CM | POA: Diagnosis not present

## 2015-11-21 DIAGNOSIS — I480 Paroxysmal atrial fibrillation: Secondary | ICD-10-CM | POA: Diagnosis not present

## 2015-11-21 DIAGNOSIS — I10 Essential (primary) hypertension: Secondary | ICD-10-CM | POA: Diagnosis not present

## 2015-11-21 DIAGNOSIS — R7309 Other abnormal glucose: Secondary | ICD-10-CM | POA: Diagnosis not present

## 2015-11-21 DIAGNOSIS — Z0000 Encounter for general adult medical examination without abnormal findings: Secondary | ICD-10-CM | POA: Diagnosis not present

## 2015-12-15 DIAGNOSIS — K760 Fatty (change of) liver, not elsewhere classified: Secondary | ICD-10-CM | POA: Diagnosis not present

## 2015-12-17 ENCOUNTER — Other Ambulatory Visit: Payer: Self-pay | Admitting: Nurse Practitioner

## 2015-12-17 DIAGNOSIS — K760 Fatty (change of) liver, not elsewhere classified: Secondary | ICD-10-CM

## 2015-12-19 ENCOUNTER — Ambulatory Visit
Admission: RE | Admit: 2015-12-19 | Discharge: 2015-12-19 | Disposition: A | Discharge status: Home / Self Care | Payer: Medicare HMO | Source: Ambulatory Visit | Attending: Nurse Practitioner | Admitting: Nurse Practitioner

## 2015-12-19 DIAGNOSIS — K8020 Calculus of gallbladder without cholecystitis without obstruction: Secondary | ICD-10-CM | POA: Diagnosis not present

## 2015-12-19 DIAGNOSIS — K760 Fatty (change of) liver, not elsewhere classified: Secondary | ICD-10-CM

## 2016-01-05 DIAGNOSIS — K8020 Calculus of gallbladder without cholecystitis without obstruction: Secondary | ICD-10-CM | POA: Diagnosis not present

## 2016-01-20 ENCOUNTER — Encounter: Payer: Self-pay | Admitting: *Deleted

## 2016-01-20 ENCOUNTER — Encounter: Payer: Medicare HMO | Attending: Internal Medicine | Admitting: *Deleted

## 2016-01-20 VITALS — BP 150/90 | Ht 72.0 in | Wt 296.0 lb

## 2016-01-20 DIAGNOSIS — Z713 Dietary counseling and surveillance: Secondary | ICD-10-CM | POA: Insufficient documentation

## 2016-01-20 DIAGNOSIS — K760 Fatty (change of) liver, not elsewhere classified: Secondary | ICD-10-CM | POA: Diagnosis not present

## 2016-01-20 DIAGNOSIS — E119 Type 2 diabetes mellitus without complications: Secondary | ICD-10-CM | POA: Insufficient documentation

## 2016-01-20 NOTE — Patient Instructions (Addendum)
Check blood sugars 2 x day before breakfast and 2 hrs after supper every other day  Exercise: Begin walking or chair exercises  for 10  minutes 3  days a week  Eat 3 meals day,  2  snacks a day Space meals 4-6 hours apart Decrease desserts and sweets Avoid sugar sweetened drinks (soda, tea)  Make an  eye doctor appointment  Bring blood sugar records to the next class  Call your doctor for a prescription for:  1. Meter strips (type) Accu-Chek Guide checking  1 time per day  2. Lancets (type) Accu-Chek Fastckix checking  1      time per day  Return for classes on:

## 2016-01-20 NOTE — Progress Notes (Signed)
Diabetes Self-Management Education  Visit Type: First/Initial  Appt. Start Time: 0910 Appt. End Time: [id number]  01/20/2016  [name], identified by name and date of birth, is a 56 y.o. male with a diagnosis of Diabetes: Type 2.   ASSESSMENT  Blood pressure (!) 150/90, height 6' (1.829 m), weight 296 lb (134.3 kg). Body mass index is 40.14 kg/m.      Diabetes Self-Management Education - 01/20/16 1654      Visit Information   Visit Type First/Initial     Initial Visit   Diabetes Type Type 2   Are you currently following a meal plan? No   Are you taking your medications as prescribed? Yes   Date Diagnosed "3 weeks ago" per A1C in Oct - 6.5 %     Health Coping   How would you rate your overall health? Poor     Psychosocial Assessment   Patient Belief/Attitude about Diabetes Other (comment)  "nervous"   Self-care barriers None   Self-management support Family   Other persons present Spouse/SO   Patient Concerns Nutrition/Meal planning;Monitoring;Weight Control;Healthy Lifestyle;Glycemic Control   Special Needs None   Preferred Learning Style Auditory   Learning Readiness Ready   How often do you need to have someone help you when you read instructions, pamphlets, or other written materials from your doctor or pharmacy? 1 - Never   What is the last grade level you completed in school? 11th     Pre-Education Assessment   Patient understands the diabetes disease and treatment process. Needs Instruction   Patient understands incorporating nutritional management into lifestyle. Needs Instruction   Patient undertands incorporating physical activity into lifestyle. Needs Instruction   Patient understands using medications safely. Needs Instruction   Patient understands monitoring blood glucose, interpreting and using results Needs Instruction   Patient understands prevention, detection, and treatment of acute complications. Needs Instruction   Patient understands prevention,  detection, and treatment of chronic complications. Needs Instruction   Patient understands how to develop strategies to address psychosocial issues. Needs Instruction   Patient understands how to develop strategies to promote health/change behavior. Needs Instruction     Complications   Last HgB A1C per patient/outside source 6.5 %  11/14/15   How often do you check your blood sugar? 0 times/day (not testing)  Provided Accu-Chek Guide meter and instructed on use. BG upon return demonstration was 150 mg/dL at 10:10 am - 2 hrs pp.    Have you had a dilated eye exam in the past 12 months? No   Have you had a dental exam in the past 12 months? Yes   Are you checking your feet? No     Dietary Intake   Breakfast eggs, spam, 3 pieces of toast; cereal and milk   Snack (morning) cheetos, oatmeal pies   Lunch skips or has left overs or burger   Snack (afternoon) snack foods or sweets   Dinner grilled meats (fish, chicken, beef) with zucchini, peas, squash, asparagus   Snack (evening) snack foods or sweets   Beverage(s) water, coffee, wine, regular soda, sugar-sweetened tea     Exercise   Exercise Type ADL's     Patient Education   Previous Diabetes Education No   Disease state  Definition of diabetes, type 1 and 2, and the diagnosis of diabetes   Nutrition management  Role of diet in the treatment of diabetes and the relationship between the three main macronutrients and blood glucose level;Food label reading, portion sizes and  measuring food.;Effects of alcohol on blood glucose and safety factors with consumption of alcohol.   Physical activity and exercise  Role of exercise on diabetes management, blood pressure control and cardiac health.   Monitoring Taught/evaluated SMBG meter.;Purpose and frequency of SMBG.;Taught/discussed recording of test results and interpretation of SMBG.;Identified appropriate SMBG and/or A1C goals.   Chronic complications Relationship between chronic complications  and blood glucose control   Psychosocial adjustment Identified and addressed patients feelings and concerns about diabetes     Individualized Goals (developed by patient)   Reducing Risk Improve blood sugars Prevent diabetes complications Lose weight Lead a healthier lifestyle Become more fit     Outcomes   Expected Outcomes Demonstrated interest in learning. Expect positive outcomes      Individualized Plan for Diabetes Self-Management Training:   Learning Objective:  Patient will have a greater understanding of diabetes self-management. Patient education plan is to attend individual and/or group sessions per assessed needs and concerns.   Plan:   Patient Instructions  Check blood sugars 2 x day before breakfast and 2 hrs after supper every other day Exercise: Begin walking or chair exercises  for 10  minutes 3  days a week Eat 3 meals day,  2  snacks a day Space meals 4-6 hours apart Decrease desserts and sweets Avoid sugar sweetened drinks (soda, tea) Make an  eye doctor appointment Bring blood sugar records to the next class Call your doctor for a prescription for:  1. Meter strips (type) Accu-Chek Guide checking  1 time per day  2. Lancets (type) Accu-Chek Fastckix checking  1      time per day  Expected Outcomes:  Demonstrated interest in learning. Expect positive outcomes  Education material provided:  General Meal Planning Guidelines Simple Meal Plan Meter = Accu-Chek Guide  If problems or questions, patient to contact team via:  Johny Drilling, [place], [place], CDE [phone]  Future DSME appointment:  January 22, 2016 for Diabetes Class 1

## 2016-01-21 ENCOUNTER — Other Ambulatory Visit: Payer: Commercial Managed Care - HMO

## 2016-01-21 ENCOUNTER — Ambulatory Visit: Payer: Commercial Managed Care - HMO | Admitting: Oncology

## 2016-01-22 ENCOUNTER — Ambulatory Visit: Payer: Commercial Managed Care - HMO | Admitting: Oncology

## 2016-01-22 ENCOUNTER — Other Ambulatory Visit: Payer: Commercial Managed Care - HMO

## 2016-01-26 ENCOUNTER — Inpatient Hospital Stay: Payer: Commercial Managed Care - HMO

## 2016-01-26 ENCOUNTER — Inpatient Hospital Stay: Payer: Commercial Managed Care - HMO | Admitting: Oncology

## 2016-01-26 NOTE — Progress Notes (Deleted)
[place]  Telephone:(336) [phone] Fax:(336) [phone]  ID: Collin Holt OB: 05-08-1960  MR#: [id number]  [id number]  Patient Care Team: [name], MD as PCP - General (Internal Medicine) [name], MD as Consulting Physician (Cardiology)  CHIEF COMPLAINT:  No chief complaint on file.    INTERVAL HISTORY: Patient returns to clinic for routine follow-up and consideration of additional phlebotomy. Over the past several weeks Collin Holt has noted increased dizziness, weakness, and headaches. Collin Holt has otherwise felt well. Collin Holt has no other neurologic complaints. Collin Holt denies any recent fevers or illnesses. Collin Holt has a good appetite and denies weight loss. Collin Holt has no chest pain or shortness of breath. Collin Holt denies any nausea, vomiting, constipation, or diarrhea. Collin Holt has no urinary complaints. Patient offers no further specific complaints today.  REVIEW OF SYSTEMS:   Review of Systems  Constitutional: Negative for fever, malaise/fatigue and weight loss.  Respiratory: Negative.  Negative for cough and shortness of breath.   Cardiovascular: Negative.  Negative for chest pain.  Gastrointestinal: Negative.  Negative for abdominal pain, blood in stool and melena.  Genitourinary: Negative.   Musculoskeletal: Negative.   Skin: Negative for itching.  Neurological: Positive for dizziness, weakness and headaches.  Psychiatric/Behavioral: Negative.     As per HPI. Otherwise, a complete review of systems is negatve.  PAST MEDICAL HISTORY: Past Medical History:  Diagnosis Date  . Chronic headaches   . Diabetes mellitus without complication ([place])   . DOE (dyspnea on exertion)    a. 02/2010 Cath: nl cors;  b. 02/2010 CPX: good fxnl capacity.  Marland Kitchen HTN (hypertension)   . OSA (obstructive sleep apnea)   . PAF (paroxysmal atrial fibrillation) ([place])    a. 2012 Echo: nl LV fxn, mod dil LA.  . Sleep apnea     PAST SURGICAL HISTORY: Past Surgical History:  Procedure Laterality Date  .  SHOULDER SURGERY     Right x 2    FAMILY HISTORY Family History  Problem Relation Age of Onset  . Emphysema Father   . Cancer Father   . Prostate cancer Neg Hx   . Colon cancer Neg Hx   . Coronary artery disease Neg Hx   . Diabetes Neg Hx        ADVANCED DIRECTIVES:    HEALTH MAINTENANCE: Social History  Substance Use Topics  . Smoking status: Former Smoker    Packs/day: 1.00    Years: 10.00    Types: Cigarettes    Quit date: 01/19/1999  . Smokeless tobacco: Former Systems developer    Quit date: 01/19/2011  . Alcohol use 2.4 oz/week    4 Glasses of wine per week     Allergies  Allergen Reactions  . Morphine And Related Nausea And Vomiting  . Terfenadine Other (See Comments)    Irregular heartbeat    Current Outpatient Prescriptions  Medication Sig Dispense Refill  . amiodarone (PACERONE) 200 MG tablet Take 1 tablet (200 mg total) by mouth 2 (two) times daily as needed. (Patient not taking: Reported on 01/20/2016) 60 tablet 6  . furosemide (LASIX) 20 MG tablet Take 1 tablet (20 mg total) by mouth 2 (two) times daily as needed. (Patient not taking: Reported on 01/20/2016) 60 tablet 3  . metoprolol succinate (TOPROL-XL) 50 MG 24 hr tablet Take 1 tablet (50 mg total) by mouth daily. Take with or immediately following a meal. 30 tablet 11  . rivaroxaban (XARELTO) 20 MG TABS tablet Take 1 tablet (20 mg total) by mouth  daily with supper. 30 tablet 11  . SUMAtriptan (IMITREX) 100 MG tablet Take 1 tablet by mouth daily as needed.    . testosterone cypionate (DEPOTESTOSTERONE CYPIONATE) 200 MG/ML injection Inject 200 mg into the muscle every 14 (fourteen) days.     . tizanidine (ZANAFLEX) 2 MG capsule Take 2 mg by mouth as needed for muscle spasms.    Marland Kitchen venlafaxine (EFFEXOR) 75 MG tablet Take 75 mg by mouth daily.       Current Facility-Administered Medications  Medication Dose Route Frequency Provider Last Rate Last Dose  . promethazine (PHENERGAN) injection 25 mg  25 mg Intravenous Q6H PRN  Janith Lima, MD   25 mg at 05/01/10 1209    OBJECTIVE: There were no vitals filed for this visit.   There is no height or weight on file to calculate BMI.    ECOG FS:0 - Asymptomatic  General: Well-developed, well-nourished, no acute distress. Eyes: Pink conjunctiva, anicteric sclera. Lungs: Clear to auscultation bilaterally. Heart: Regular rate and rhythm. No rubs, murmurs, or gallops. Abdomen: Soft, nontender, nondistended. No organomegaly noted, normoactive bowel sounds. Musculoskeletal: No edema, cyanosis, or clubbing. Neuro: Alert, answering all questions appropriately. Cranial nerves grossly intact. Skin: No rashes or petechiae noted. Psych: Normal affect.   LAB RESULTS:  Lab Results  Component Value Date   NA 139 07/20/2013   K 4.2 07/20/2013   CL 105 07/20/2013   CO2 26 07/20/2013   GLUCOSE 104 (H) 07/20/2013   BUN 11 07/20/2013   CREATININE 0.95 07/20/2013   CALCIUM 9.0 07/20/2013   PROT 7.4 07/19/2013   ALBUMIN 3.7 07/19/2013   AST 29 07/19/2013   ALT 32 07/19/2013   ALKPHOS 65 07/19/2013   BILITOT 1.1 (H) 07/19/2013   GFRNONAA >60 07/20/2013   GFRAA >60 07/20/2013    Lab Results  Component Value Date   WBC 6.4 02/12/2015   NEUTROABS 3.4 02/12/2015   HGB 17.7 07/10/2015   HCT 48.8 02/12/2015   MCV 95.4 02/12/2015   PLT 199 02/12/2015   Lab Results  Component Value Date   FERRITIN 61 07/10/2015     STUDIES: No results found.  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  PLAN:    1. Hemachromatosis: Ferritin today is 61. Goal ferritin will be between 50 and 100. Patient appears to only require phlebotomy once or twice per year. Return to clinic in 6 months with repeat laboratory work and further evaluation.   2. Atrial fibrillation: Continue Xarelto as ordered. 3. Hypertension: Patient's blood pressure mildly elevated today, continue current medications as prescribed. 4. Headaches, dizziness: Possibly related to  elevated blood pressure or atrial fibrillation. Patient has been instructed to contact both his PCP as well as his cardiologist.   [name], MD   01/26/2016 8:54 AM

## 2016-01-29 ENCOUNTER — Encounter: Payer: Medicare HMO | Admitting: Dietician

## 2016-01-29 ENCOUNTER — Encounter: Payer: Self-pay | Admitting: Dietician

## 2016-01-29 VITALS — Ht 72.0 in | Wt 298.3 lb

## 2016-01-29 DIAGNOSIS — K760 Fatty (change of) liver, not elsewhere classified: Secondary | ICD-10-CM | POA: Diagnosis not present

## 2016-01-29 DIAGNOSIS — E119 Type 2 diabetes mellitus without complications: Secondary | ICD-10-CM | POA: Diagnosis not present

## 2016-01-29 DIAGNOSIS — Z713 Dietary counseling and surveillance: Secondary | ICD-10-CM | POA: Diagnosis not present

## 2016-01-29 NOTE — Progress Notes (Signed)

## 2016-02-10 NOTE — Progress Notes (Signed)
[place]  Telephone:(336) [phone] [phone]) [phone]  ID: [name] OB: 06-May-1960  MR#: [id number]  [id number]  Patient Care Team: [name], MD as PCP - General (Internal Medicine) [name], MD as Consulting Physician (Cardiology)  CHIEF COMPLAINT:  Hereditary hemochromatosis   INTERVAL HISTORY: Patient returns to clinic for routine follow-up and consideration of additional phlebotomy. He currently feels well and is asymptomatic. He has noted no dizziness or headaches. He has no neurologic complaints. He does not complain of weakness or fatigue. He denies any recent fevers or illnesses. He has a good appetite and denies weight loss. He has no chest pain or shortness of breath. He denies any nausea, vomiting, constipation, or diarrhea. He has no urinary complaints. Patient offers no specific complaints today.  REVIEW OF SYSTEMS:   Review of Systems  Constitutional: Negative for fever, malaise/fatigue and weight loss.  Respiratory: Negative.  Negative for cough and shortness of breath.   Cardiovascular: Negative.  Negative for chest pain and leg swelling.  Gastrointestinal: Negative.  Negative for abdominal pain, blood in stool and melena.  Genitourinary: Negative.   Musculoskeletal: Negative.   Skin: Negative for itching.  Neurological: Negative.  Negative for dizziness, weakness and headaches.  Psychiatric/Behavioral: Negative.  The patient is not nervous/anxious.     As per HPI. Otherwise, a complete review of systems is negative.  PAST MEDICAL HISTORY: Past Medical History:  Diagnosis Date  . Chronic headaches   . Diabetes mellitus without complication ([place])   . DOE (dyspnea on exertion)    a. 02/2010 Cath: nl cors;  b. 02/2010 CPX: good fxnl capacity.  Marland Kitchen HTN (hypertension)   . OSA (obstructive sleep apnea)   . PAF (paroxysmal atrial fibrillation) ([place])    a. 2012 Echo: nl LV fxn, mod dil LA.  . Sleep apnea     PAST SURGICAL  HISTORY: Past Surgical History:  Procedure Laterality Date  . SHOULDER SURGERY     Right x 2    FAMILY HISTORY Family History  Problem Relation Age of Onset  . Emphysema Father   . Cancer Father   . Prostate cancer Neg Hx   . Colon cancer Neg Hx   . Coronary artery disease Neg Hx   . Diabetes Neg Hx        ADVANCED DIRECTIVES:    HEALTH MAINTENANCE: Social History  Substance Use Topics  . Smoking status: Former Smoker    Packs/day: 1.00    Years: 10.00    Types: Cigarettes    Quit date: 01/19/1999  . Smokeless tobacco: Former Systems developer    Quit date: 01/19/2011  . Alcohol use 2.4 oz/week    4 Glasses of wine per week     Allergies  Allergen Reactions  . Morphine And Related Nausea And Vomiting  . Terfenadine Other (See Comments)    Irregular heartbeat    Current Outpatient Prescriptions  Medication Sig Dispense Refill  . metoprolol succinate (TOPROL-XL) 50 MG 24 hr tablet Take 1 tablet (50 mg total) by mouth daily. Take with or immediately following a meal. 30 tablet 11  . rivaroxaban (XARELTO) 20 MG TABS tablet Take 1 tablet (20 mg total) by mouth daily with supper. 30 tablet 11  . SUMAtriptan (IMITREX) 100 MG tablet Take 1 tablet by mouth daily as needed.    . testosterone cypionate (DEPOTESTOSTERONE CYPIONATE) 200 MG/ML injection Inject 200 mg into the muscle every 14 (fourteen) days.     Marland Kitchen venlafaxine (EFFEXOR) 75 MG  tablet Take 75 mg by mouth daily.       Current Facility-Administered Medications  Medication Dose Route Frequency Provider Last Rate Last Dose  . promethazine (PHENERGAN) injection 25 mg  25 mg Intravenous Q6H PRN Janith Lima, MD   25 mg at 05/01/10 1209    OBJECTIVE: Vitals:   02/11/16 1417  BP: 130/89  Pulse: 60  Resp: 18  Temp: 97.1 F (36.2 C)     Body mass index is 40.62 kg/m.    ECOG FS:0 - Asymptomatic  General: Well-developed, well-nourished, no acute distress. Eyes: Pink conjunctiva, anicteric sclera. Lungs: Clear to  auscultation bilaterally. Heart: Regular rate and rhythm. No rubs, murmurs, or gallops. Abdomen: Soft, nontender, nondistended. No organomegaly noted, normoactive bowel sounds. Musculoskeletal: No edema, cyanosis, or clubbing. Neuro: Alert, answering all questions appropriately. Cranial nerves grossly intact. Skin: No rashes or petechiae noted. Psych: Normal affect.   LAB RESULTS:  Lab Results  Component Value Date   NA 139 07/20/2013   K 4.2 07/20/2013   CL 105 07/20/2013   CO2 26 07/20/2013   GLUCOSE 104 (H) 07/20/2013   BUN 11 07/20/2013   CREATININE 0.95 07/20/2013   CALCIUM 9.0 07/20/2013   PROT 7.4 07/19/2013   ALBUMIN 3.7 07/19/2013   AST 29 07/19/2013   ALT 32 07/19/2013   ALKPHOS 65 07/19/2013   BILITOT 1.1 (H) 07/19/2013   GFRNONAA >60 07/20/2013   GFRAA >60 07/20/2013    Lab Results  Component Value Date   WBC 6.4 02/12/2015   NEUTROABS 3.4 02/12/2015   HGB 18.2 (H) 02/11/2016   HCT 48.8 02/12/2015   MCV 95.4 02/12/2015   PLT 199 02/12/2015   Lab Results  Component Value Date   FERRITIN 44 02/11/2016     STUDIES: No results found.  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  PLAN:    1. Hereditary hemochromatosis: Although patient's ferritin is only 44 today, his hemoglobin has trended up and is now 18.2. Will proceed with phlebotomy as scheduled. Goal ferritin will be between 50 and 100. Patient appears to only require phlebotomy once or twice per year. Return to clinic in 6 months with repeat laboratory work and further evaluation.   2. Atrial fibrillation: Continue Xarelto as ordered. 3. Hypertension: Patient's blood pressure is within normal limits today, continue current medications as prescribed.   [name], MD   02/15/2016 8:11 AM

## 2016-02-11 ENCOUNTER — Inpatient Hospital Stay: Payer: Medicare HMO

## 2016-02-11 ENCOUNTER — Inpatient Hospital Stay (HOSPITAL_BASED_OUTPATIENT_CLINIC_OR_DEPARTMENT_OTHER): Payer: Medicare HMO | Admitting: Oncology

## 2016-02-11 ENCOUNTER — Inpatient Hospital Stay: Payer: Medicare HMO | Attending: Oncology | Admitting: *Deleted

## 2016-02-11 DIAGNOSIS — I480 Paroxysmal atrial fibrillation: Secondary | ICD-10-CM | POA: Insufficient documentation

## 2016-02-11 DIAGNOSIS — Z79899 Other long term (current) drug therapy: Secondary | ICD-10-CM

## 2016-02-11 DIAGNOSIS — E119 Type 2 diabetes mellitus without complications: Secondary | ICD-10-CM | POA: Diagnosis not present

## 2016-02-11 DIAGNOSIS — I4891 Unspecified atrial fibrillation: Secondary | ICD-10-CM

## 2016-02-11 DIAGNOSIS — Z87891 Personal history of nicotine dependence: Secondary | ICD-10-CM | POA: Insufficient documentation

## 2016-02-11 DIAGNOSIS — Z7901 Long term (current) use of anticoagulants: Secondary | ICD-10-CM | POA: Diagnosis not present

## 2016-02-11 DIAGNOSIS — G4733 Obstructive sleep apnea (adult) (pediatric): Secondary | ICD-10-CM | POA: Diagnosis not present

## 2016-02-11 DIAGNOSIS — I10 Essential (primary) hypertension: Secondary | ICD-10-CM | POA: Insufficient documentation

## 2016-02-11 LAB — FERRITIN: Ferritin: 44 ng/mL (ref 24–336)

## 2016-02-11 LAB — HEMOGLOBIN: HEMOGLOBIN: 18.2 g/dL — AB (ref 13.0–18.0)

## 2016-02-11 NOTE — Progress Notes (Signed)
No phlebotomy today per parameters. LJ 

## 2016-02-12 ENCOUNTER — Ambulatory Visit: Payer: Commercial Managed Care - HMO

## 2016-02-18 DIAGNOSIS — E349 Endocrine disorder, unspecified: Secondary | ICD-10-CM | POA: Diagnosis not present

## 2016-02-18 DIAGNOSIS — R7309 Other abnormal glucose: Secondary | ICD-10-CM | POA: Diagnosis not present

## 2016-02-24 DIAGNOSIS — F3341 Major depressive disorder, recurrent, in partial remission: Secondary | ICD-10-CM | POA: Diagnosis not present

## 2016-02-24 DIAGNOSIS — E7800 Pure hypercholesterolemia, unspecified: Secondary | ICD-10-CM | POA: Diagnosis not present

## 2016-02-24 DIAGNOSIS — Z125 Encounter for screening for malignant neoplasm of prostate: Secondary | ICD-10-CM | POA: Diagnosis not present

## 2016-02-24 DIAGNOSIS — Z79899 Other long term (current) drug therapy: Secondary | ICD-10-CM | POA: Diagnosis not present

## 2016-02-24 DIAGNOSIS — E349 Endocrine disorder, unspecified: Secondary | ICD-10-CM | POA: Diagnosis not present

## 2016-02-24 DIAGNOSIS — I10 Essential (primary) hypertension: Secondary | ICD-10-CM | POA: Diagnosis not present

## 2016-02-24 DIAGNOSIS — R7309 Other abnormal glucose: Secondary | ICD-10-CM | POA: Diagnosis not present

## 2016-02-24 DIAGNOSIS — Z0000 Encounter for general adult medical examination without abnormal findings: Secondary | ICD-10-CM | POA: Diagnosis not present

## 2016-02-26 ENCOUNTER — Telehealth: Payer: Self-pay | Admitting: *Deleted

## 2016-02-26 ENCOUNTER — Ambulatory Visit: Payer: Commercial Managed Care - HMO

## 2016-02-26 NOTE — Telephone Encounter (Signed)
Pt didn't show for Class 2 today. Called and left message to come to Class 3 next week and reschedule Class 2 at that time.

## 2016-03-04 ENCOUNTER — Ambulatory Visit: Payer: Commercial Managed Care - HMO

## 2016-03-19 DIAGNOSIS — G4733 Obstructive sleep apnea (adult) (pediatric): Secondary | ICD-10-CM | POA: Diagnosis not present

## 2016-04-12 ENCOUNTER — Encounter: Payer: Self-pay | Admitting: *Deleted

## 2016-05-17 DIAGNOSIS — Z125 Encounter for screening for malignant neoplasm of prostate: Secondary | ICD-10-CM | POA: Diagnosis not present

## 2016-05-17 DIAGNOSIS — Z79899 Other long term (current) drug therapy: Secondary | ICD-10-CM | POA: Diagnosis not present

## 2016-05-17 DIAGNOSIS — R7309 Other abnormal glucose: Secondary | ICD-10-CM | POA: Diagnosis not present

## 2016-05-17 DIAGNOSIS — I10 Essential (primary) hypertension: Secondary | ICD-10-CM | POA: Diagnosis not present

## 2016-05-17 DIAGNOSIS — E349 Endocrine disorder, unspecified: Secondary | ICD-10-CM | POA: Diagnosis not present

## 2016-05-17 DIAGNOSIS — E7800 Pure hypercholesterolemia, unspecified: Secondary | ICD-10-CM | POA: Diagnosis not present

## 2016-05-24 DIAGNOSIS — M79671 Pain in right foot: Secondary | ICD-10-CM | POA: Diagnosis not present

## 2016-05-24 DIAGNOSIS — E349 Endocrine disorder, unspecified: Secondary | ICD-10-CM | POA: Diagnosis not present

## 2016-05-24 DIAGNOSIS — I10 Essential (primary) hypertension: Secondary | ICD-10-CM | POA: Diagnosis not present

## 2016-06-01 DIAGNOSIS — M79671 Pain in right foot: Secondary | ICD-10-CM | POA: Diagnosis not present

## 2016-06-01 DIAGNOSIS — L821 Other seborrheic keratosis: Secondary | ICD-10-CM | POA: Diagnosis not present

## 2016-06-01 DIAGNOSIS — M722 Plantar fascial fibromatosis: Secondary | ICD-10-CM | POA: Diagnosis not present

## 2016-06-01 DIAGNOSIS — D485 Neoplasm of uncertain behavior of skin: Secondary | ICD-10-CM | POA: Diagnosis not present

## 2016-06-29 DIAGNOSIS — M722 Plantar fascial fibromatosis: Secondary | ICD-10-CM | POA: Diagnosis not present

## 2016-07-18 ENCOUNTER — Other Ambulatory Visit: Payer: Self-pay | Admitting: Cardiovascular Disease

## 2016-07-27 ENCOUNTER — Telehealth: Payer: Self-pay | Admitting: Cardiovascular Disease

## 2016-07-27 ENCOUNTER — Telehealth: Payer: Self-pay | Admitting: *Deleted

## 2016-07-27 NOTE — Telephone Encounter (Signed)
-----   Message from [place], [place] sent at 07/27/2016  7:45 AM EDT ----- Good morning,  Pt requesting refills and has not been seen in over a year. Please contact pt for appointment.  Thank  You, Lenda Kelp

## 2016-07-27 NOTE — Telephone Encounter (Signed)
lmov to schedule appt .  This is attempt x 2 to schedule.

## 2016-07-27 NOTE — Telephone Encounter (Signed)
error 

## 2016-07-27 NOTE — Telephone Encounter (Signed)
Noted  

## 2016-07-28 DIAGNOSIS — R1033 Periumbilical pain: Secondary | ICD-10-CM | POA: Diagnosis not present

## 2016-07-28 NOTE — Telephone Encounter (Signed)
Patients pharmacy is requesting a refill for Metoprolol Succ ER 50MG . Patient has not been seen since 06/2015 and never followed up. We have attempted twice to contact patient. Please advise.

## 2016-07-28 NOTE — Telephone Encounter (Signed)
Spoke with patient and made him aware that we need him to come in to be seen by Dr. Rockey Situ for further refills. He verbalized understanding and transferred him to scheduling to make appointment.

## 2016-07-28 NOTE — Telephone Encounter (Signed)
It appears that this medication was sent in on 07/19/16 for one month prescription and there have been multiple attempts to schedule appointment.

## 2016-08-05 DIAGNOSIS — G4733 Obstructive sleep apnea (adult) (pediatric): Secondary | ICD-10-CM | POA: Diagnosis not present

## 2016-08-10 NOTE — Progress Notes (Signed)
[place]  Telephone:(336) [phone] Fax:(336) [phone]  ID: Collin Holt OB: 1960-10-04  MR#: [id number]  IRW#:[id number]  Patient Care Team: [name], MD as PCP - General (Internal Medicine) [name], MD as Consulting Physician (Cardiology)  CHIEF COMPLAINT:  Hereditary hemochromatosis   INTERVAL HISTORY: Patient returns to clinic for routine follow-up and consideration of additional phlebotomy. He currently feels well and is asymptomatic. He has noted no dizziness or headaches. He has no neurologic complaints. He does not complain of weakness or fatigue. He denies any recent fevers or illnesses. He has a good appetite and denies weight loss. He has no chest pain or shortness of breath. He denies any nausea, vomiting, constipation, or diarrhea. He has no urinary complaints. Patient offers no specific complaints today.  REVIEW OF SYSTEMS:   Review of Systems  Constitutional: Negative for fever, malaise/fatigue and weight loss.  Respiratory: Negative.  Negative for cough and shortness of breath.   Cardiovascular: Negative.  Negative for chest pain and leg swelling.  Gastrointestinal: Negative.  Negative for abdominal pain, blood in stool and melena.  Genitourinary: Negative.   Musculoskeletal: Negative.   Skin: Negative for itching.  Neurological: Negative.  Negative for dizziness, weakness and headaches.  Psychiatric/Behavioral: Negative.  The patient is not nervous/anxious.     As per HPI. Otherwise, a complete review of systems is negative.  PAST MEDICAL HISTORY: Past Medical History:  Diagnosis Date  . Chronic headaches   . Diabetes mellitus without complication ([place])   . DOE (dyspnea on exertion)    a. 02/2010 Cath: nl cors;  b. 02/2010 CPX: good fxnl capacity.  Marland Kitchen HTN (hypertension)   . OSA (obstructive sleep apnea)   . PAF (paroxysmal atrial fibrillation) ([place])    a. 2012 Echo: nl LV fxn, mod dil LA.  . Sleep apnea     PAST SURGICAL  HISTORY: Past Surgical History:  Procedure Laterality Date  . SHOULDER SURGERY     Right x 2    FAMILY HISTORY Family History  Problem Relation Age of Onset  . Emphysema Father   . Cancer Father   . Prostate cancer Neg Hx   . Colon cancer Neg Hx   . Coronary artery disease Neg Hx   . Diabetes Neg Hx        ADVANCED DIRECTIVES:    HEALTH MAINTENANCE: Social History  Substance Use Topics  . Smoking status: Former Smoker    Packs/day: 1.00    Years: 10.00    Types: Cigarettes    Quit date: 01/19/1999  . Smokeless tobacco: Former Systems developer    Quit date: 01/19/2011  . Alcohol use 2.4 oz/week    4 Glasses of wine per week     Allergies  Allergen Reactions  . Morphine And Related Nausea And Vomiting  . Terfenadine Other (See Comments)    Irregular heartbeat    Current Outpatient Prescriptions  Medication Sig Dispense Refill  . metoprolol succinate (TOPROL-XL) 50 MG 24 hr tablet TAKE ONE TABLET BY MOUTH ONCE DAILY TAKE  WITH  OR  IMMEDIATELY  FOLLOWING  A  MEAL 30 tablet 0  . SUMAtriptan (IMITREX) 100 MG tablet Take 1 tablet by mouth daily as needed.    . testosterone cypionate (DEPOTESTOSTERONE CYPIONATE) 200 MG/ML injection Inject 200 mg into the muscle every 14 (fourteen) days.     Marland Kitchen venlafaxine (EFFEXOR) 75 MG tablet Take 75 mg by mouth daily.      Alveda Reasons 20 MG TABS tablet  TAKE ONE TABLET BY MOUTH ONCE DAILY WITH SUPPER 30 tablet 0   Current Facility-Administered Medications  Medication Dose Route Frequency Provider Last Rate Last Dose  . promethazine (PHENERGAN) injection 25 mg  25 mg Intravenous Q6H PRN Janith Lima, MD   25 mg at 05/01/10 1209    OBJECTIVE: Vitals:   08/11/16 1347  BP: 128/83  Pulse: 63  Resp: 18  Temp: 97.6 F (36.4 C)     Body mass index is 39.45 kg/m.    ECOG FS:0 - Asymptomatic  General: Well-developed, well-nourished, no acute distress. Eyes: Pink conjunctiva, anicteric sclera. Lungs: Clear to auscultation bilaterally. Heart:  Regular rate and rhythm. No rubs, murmurs, or gallops. Abdomen: Soft, nontender, nondistended. No organomegaly noted, normoactive bowel sounds. Musculoskeletal: No edema, cyanosis, or clubbing. Neuro: Alert, answering all questions appropriately. Cranial nerves grossly intact. Skin: No rashes or petechiae noted. Psych: Normal affect.   LAB RESULTS:  Lab Results  Component Value Date   NA 139 07/20/2013   K 4.2 07/20/2013   CL 105 07/20/2013   CO2 26 07/20/2013   GLUCOSE 104 (H) 07/20/2013   BUN 11 07/20/2013   CREATININE 0.95 07/20/2013   CALCIUM 9.0 07/20/2013   PROT 7.4 07/19/2013   ALBUMIN 3.7 07/19/2013   AST 29 07/19/2013   ALT 32 07/19/2013   ALKPHOS 65 07/19/2013   BILITOT 1.1 (H) 07/19/2013   GFRNONAA >60 07/20/2013   GFRAA >60 07/20/2013    Lab Results  Component Value Date   WBC 6.0 08/11/2016   NEUTROABS 3.5 08/11/2016   HGB 18.7 (H) 08/11/2016   HCT 53.6 (H) 08/11/2016   MCV 96.2 08/11/2016   PLT 185 08/11/2016   Lab Results  Component Value Date   FERRITIN 219 08/11/2016     STUDIES: No results found.  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  PLAN:    1. Hereditary hemochromatosis: Patient's hemoglobin and iron stores have trended up, therefore will proceed with 400 mL phlebotomy today. Goal ferritin will be between 50 and 100. Return to clinic in 3 months for laboratory work and phlebotomy and then in 6 months for laboratory work and further evaluation.  2. Atrial fibrillation: Continue Xarelto as ordered. 3. Hypertension: Patient's blood pressure is within normal limits today, continue current medications as prescribed.   [name], MD   08/13/2016 1:00 PM

## 2016-08-11 ENCOUNTER — Inpatient Hospital Stay: Payer: Medicare HMO

## 2016-08-11 ENCOUNTER — Inpatient Hospital Stay: Payer: Medicare HMO | Attending: Oncology | Admitting: Oncology

## 2016-08-11 DIAGNOSIS — Z87891 Personal history of nicotine dependence: Secondary | ICD-10-CM | POA: Insufficient documentation

## 2016-08-11 DIAGNOSIS — G4733 Obstructive sleep apnea (adult) (pediatric): Secondary | ICD-10-CM | POA: Diagnosis not present

## 2016-08-11 DIAGNOSIS — G4730 Sleep apnea, unspecified: Secondary | ICD-10-CM | POA: Diagnosis not present

## 2016-08-11 DIAGNOSIS — I10 Essential (primary) hypertension: Secondary | ICD-10-CM | POA: Diagnosis not present

## 2016-08-11 DIAGNOSIS — Z79899 Other long term (current) drug therapy: Secondary | ICD-10-CM | POA: Diagnosis not present

## 2016-08-11 DIAGNOSIS — E119 Type 2 diabetes mellitus without complications: Secondary | ICD-10-CM | POA: Insufficient documentation

## 2016-08-11 DIAGNOSIS — Z7901 Long term (current) use of anticoagulants: Secondary | ICD-10-CM

## 2016-08-11 DIAGNOSIS — R0609 Other forms of dyspnea: Secondary | ICD-10-CM | POA: Insufficient documentation

## 2016-08-11 DIAGNOSIS — I480 Paroxysmal atrial fibrillation: Secondary | ICD-10-CM

## 2016-08-11 LAB — CBC WITH DIFFERENTIAL/PLATELET
Basophils Absolute: 0 10*3/uL (ref 0–0.1)
Basophils Relative: 1 %
EOS ABS: 0.2 10*3/uL (ref 0–0.7)
EOS PCT: 3 %
HCT: 53.6 % — ABNORMAL HIGH (ref 40.0–52.0)
HEMOGLOBIN: 18.7 g/dL — AB (ref 13.0–18.0)
LYMPHS ABS: 1.8 10*3/uL (ref 1.0–3.6)
Lymphocytes Relative: 29 %
MCH: 33.6 pg (ref 26.0–34.0)
MCHC: 35 g/dL (ref 32.0–36.0)
MCV: 96.2 fL (ref 80.0–100.0)
MONO ABS: 0.5 10*3/uL (ref 0.2–1.0)
MONOS PCT: 9 %
Neutro Abs: 3.5 10*3/uL (ref 1.4–6.5)
Neutrophils Relative %: 58 %
PLATELETS: 185 10*3/uL (ref 150–440)
RBC: 5.58 MIL/uL (ref 4.40–5.90)
RDW: 13.3 % (ref 11.5–14.5)
WBC: 6 10*3/uL (ref 3.8–10.6)

## 2016-08-11 LAB — IRON AND TIBC
IRON: 155 ug/dL (ref 45–182)
SATURATION RATIOS: 43 % — AB (ref 17.9–39.5)
TIBC: 357 ug/dL (ref 250–450)
UIBC: 202 ug/dL

## 2016-08-11 LAB — FERRITIN: Ferritin: 219 ng/mL (ref 24–336)

## 2016-08-11 NOTE — Progress Notes (Signed)
Patient is here for follow up, he is doing well no complaints  

## 2016-08-17 DIAGNOSIS — E349 Endocrine disorder, unspecified: Secondary | ICD-10-CM | POA: Diagnosis not present

## 2016-08-19 ENCOUNTER — Other Ambulatory Visit: Payer: Self-pay | Admitting: Cardiovascular Disease

## 2016-08-23 DIAGNOSIS — R1033 Periumbilical pain: Secondary | ICD-10-CM | POA: Diagnosis not present

## 2016-08-24 DIAGNOSIS — I480 Paroxysmal atrial fibrillation: Secondary | ICD-10-CM | POA: Diagnosis not present

## 2016-08-24 DIAGNOSIS — I10 Essential (primary) hypertension: Secondary | ICD-10-CM | POA: Diagnosis not present

## 2016-08-24 DIAGNOSIS — E349 Endocrine disorder, unspecified: Secondary | ICD-10-CM | POA: Diagnosis not present

## 2016-08-24 DIAGNOSIS — Z125 Encounter for screening for malignant neoplasm of prostate: Secondary | ICD-10-CM | POA: Diagnosis not present

## 2016-08-24 DIAGNOSIS — R7309 Other abnormal glucose: Secondary | ICD-10-CM | POA: Diagnosis not present

## 2016-08-24 DIAGNOSIS — K760 Fatty (change of) liver, not elsewhere classified: Secondary | ICD-10-CM | POA: Diagnosis not present

## 2016-08-24 DIAGNOSIS — E782 Mixed hyperlipidemia: Secondary | ICD-10-CM | POA: Diagnosis not present

## 2016-08-24 DIAGNOSIS — Z79899 Other long term (current) drug therapy: Secondary | ICD-10-CM | POA: Diagnosis not present

## 2016-09-02 DIAGNOSIS — Z13220 Encounter for screening for lipoid disorders: Secondary | ICD-10-CM | POA: Insufficient documentation

## 2016-09-02 DIAGNOSIS — Z7189 Other specified counseling: Secondary | ICD-10-CM | POA: Insufficient documentation

## 2016-09-02 NOTE — Progress Notes (Signed)
Patient ID: Collin Holt, male   DOB: 1960/02/21, 56 y.o.   MRN: [id number] Cardiology Office Note  Date:  09/03/2016   ID:  Collin Holt, DOB 1960/07/03, MRN [id number]  PCP:  [name], MD   Chief Complaint  Patient presents with  . other    6 month f/u no complaints today. Meds reviewed verbally with pt.    HPI:  [name] is a 56 year old gentleman with  ETOH use Obesity,  obstructive sleep apnea,  paroxysmal atrial fibrillation,  long history of vertigo,  eriodic alcohol use  on disability for headaches and severe vertigo,   in the hospital July 2015 for atrial fibrillation. He was given amiodarone in the hospital and converted to normal sinus rhythm shortly after discharge. Prior cardiac catheterization every 2012 showing no significant CAD Prior echocardiogram 01/2010 showing normal ejection fraction History of sleep apnea, compliant with his CPAP He presents today for follow-up of his atrial fibrillation  he is taking metoprolol one a day, xarelto one half pill per day Weight is down 13 pounds from early 2018 Changed his diet he has been compliant with his CPAP  Rare episodes of atrial fibrillation, perhaps one episode and he took amiodarone with resolution of his arrhythmia  Previous office visit reported having significant alcohol problems, drinking to excess Previous episode after drinking heavy alcohol on Father's Day, that evening had significant atrial fibrillation  EKG on today's visit shows normal sinus rhythm with rate 59 bpm, left axis deviation   Other past medical history diagnosis of hemochromatosis, followed by Dr. Grayland Ormond Also told he has a fatty liver, needs to lose weight  on disability for continued severe vertigo when he lays down in bed, severe tinnitus Denies any significant shortness of breath or chest pain with exertion   PMH:   has a past medical history of Chronic headaches; Diabetes mellitus without complication ([place]); DOE  (dyspnea on exertion); HTN (hypertension); OSA (obstructive sleep apnea); PAF (paroxysmal atrial fibrillation) ([place]); and Sleep apnea.  PSH:    Past Surgical History:  Procedure Laterality Date  . SHOULDER SURGERY     Right x 2    Current Outpatient Prescriptions  Medication Sig Dispense Refill  . metoprolol succinate (TOPROL-XL) 50 MG 24 hr tablet TAKE 1 TABLET BY MOUTH ONCE DAILY TAKE  WITH  OR  IMMEDIATELY  FOLLOWING  A  MEAL 30 tablet 6  . SUMAtriptan (IMITREX) 100 MG tablet Take 1 tablet by mouth daily as needed.    . testosterone cypionate (DEPOTESTOSTERONE CYPIONATE) 200 MG/ML injection Inject 200 mg into the muscle every 14 (fourteen) days.     Marland Kitchen venlafaxine (EFFEXOR) 75 MG tablet Take 75 mg by mouth daily.      Alveda Reasons 20 MG TABS tablet TAKE 1 TABLET BY MOUTH ONCE DAILY WITH  SUPPER  *CALL  OFFICE  TO  SCHEDULE  FUTURE  APPOINTMENT  AND  REFILLS* 30 tablet 6   Current Facility-Administered Medications  Medication Dose Route Frequency Provider Last Rate Last Dose  . promethazine (PHENERGAN) injection 25 mg  25 mg Intravenous Q6H PRN Janith Lima, MD   25 mg at 05/01/10 1209     Allergies:   Morphine and related and Terfenadine   Social History:  The patient  reports that he quit smoking about 17 years ago. His smoking use included Cigarettes. He has a 10.00 pack-year smoking history. He quit smokeless tobacco use about 5 years ago. He reports that he drinks about  2.4 oz of alcohol per week . He reports that he does not use drugs.   Family History:   family history includes Cancer in his father; Emphysema in his father.    Review of Systems: Review of Systems  Constitutional: Negative.   Respiratory: Negative.   Cardiovascular: Positive for palpitations.       Tachycardia  Gastrointestinal: Negative.   Musculoskeletal: Negative.   Neurological: Negative.   Psychiatric/Behavioral: Negative.   All other systems reviewed and are negative.    PHYSICAL EXAM: VS:   BP 136/82 (BP Location: Left Arm, Patient Position: Sitting, Cuff Size: Large)   Pulse (!) 59   Ht 6' (1.829 m)   Wt 286 lb 8 oz (130 kg)   BMI 38.86 kg/m  , BMI Body mass index is 38.86 kg/m. GEN: Well nourished, well developed, in no acute distress, obese  HEENT: normal  Neck: no JVD, carotid bruits, or masses Cardiac: RRR; no murmurs, rubs, or gallops,no edema  Respiratory:  clear to auscultation bilaterally, normal work of breathing GI: soft, nontender, nondistended, + BS MS: no deformity or atrophy  Skin: warm and dry, no rash Neuro:  Strength and sensation are intact Psych: euthymic mood, full affect    Recent Labs: 08/11/2016: Hemoglobin 18.7; Platelets 185    Lipid Panel Lab Results  Component Value Date   CHOL 195 10/23/2007   HDL 32.4 10/23/2007   LDLCALC 84.2 10/23/2007   TRIG 392 10/23/2007      Wt Readings from Last 3 Encounters:  09/03/16 286 lb 8 oz (130 kg)  08/11/16 290 lb 14.4 oz (132 kg)  02/11/16 299 lb 7.9 oz (135.9 kg)       ASSESSMENT AND PLAN:  Atrial fibrillation, unspecified type ([place]) - Plan: EKG 12-Lead Recommended he stay on xarelto 20 mg daily metoprolol succinate 50 mg daily Suggested he take amiodarone 1-2 pills as needed for breakthrough atrial fibrillation He does not want to take this on a regular basis  HYPERTENSION, BENIGN Blood pressure is well controlled on today's visit. No changes made to the medications.  Obstructive sleep apnea Reports he is compliant with his CPAP  Morbid obesity due to excess calories ([place]) We have encouraged continued exercise, careful diet management in an effort to lose weight.  Hemachromatosis Periodic blood phlebotomy  Encounter for anticoagulation discussion and counseling Tolerating anticoagulation, no bleeding, blood work managed to primary care   Long discussion concerning his medications, need for medication compliance, options for his atrial fibrillation  Total encounter time more  than 25 minutes  Greater than 50% was spent in counseling and coordination of care with the patient   Disposition:   F/U  12 months   No orders of the defined types were placed in this encounter.    Signed, Collin Holt, M.D., Ph.D. 09/03/2016  [place], [place]

## 2016-09-03 ENCOUNTER — Ambulatory Visit (INDEPENDENT_AMBULATORY_CARE_PROVIDER_SITE_OTHER): Payer: Medicare HMO | Admitting: Cardiovascular Disease

## 2016-09-03 ENCOUNTER — Encounter: Payer: Self-pay | Admitting: Cardiovascular Disease

## 2016-09-03 VITALS — BP 136/82 | HR 59 | Ht 72.0 in | Wt 286.5 lb

## 2016-09-03 DIAGNOSIS — G4733 Obstructive sleep apnea (adult) (pediatric): Secondary | ICD-10-CM | POA: Diagnosis not present

## 2016-09-03 DIAGNOSIS — Z7189 Other specified counseling: Secondary | ICD-10-CM | POA: Diagnosis not present

## 2016-09-03 DIAGNOSIS — I10 Essential (primary) hypertension: Secondary | ICD-10-CM

## 2016-09-03 DIAGNOSIS — I480 Paroxysmal atrial fibrillation: Secondary | ICD-10-CM

## 2016-09-03 MED ORDER — RIVAROXABAN 20 MG PO TABS: 20.0000 mg | ORAL_TABLET | Freq: Every day | ORAL | 4 refills | Status: DC

## 2016-09-03 MED ORDER — METOPROLOL SUCCINATE ER 50 MG PO TB24: 50.0000 mg | ORAL_TABLET | Freq: Every day | ORAL | 4 refills | Status: DC

## 2016-09-03 NOTE — Patient Instructions (Signed)

## 2016-09-30 DIAGNOSIS — M722 Plantar fascial fibromatosis: Secondary | ICD-10-CM | POA: Diagnosis not present

## 2016-11-09 ENCOUNTER — Other Ambulatory Visit: Payer: Self-pay | Admitting: *Deleted

## 2016-11-10 ENCOUNTER — Inpatient Hospital Stay: Payer: Medicare HMO

## 2016-11-10 ENCOUNTER — Inpatient Hospital Stay: Payer: Medicare HMO | Attending: Oncology

## 2016-11-10 DIAGNOSIS — E119 Type 2 diabetes mellitus without complications: Secondary | ICD-10-CM | POA: Diagnosis not present

## 2016-11-10 DIAGNOSIS — Z87891 Personal history of nicotine dependence: Secondary | ICD-10-CM | POA: Insufficient documentation

## 2016-11-10 DIAGNOSIS — I480 Paroxysmal atrial fibrillation: Secondary | ICD-10-CM | POA: Insufficient documentation

## 2016-11-10 DIAGNOSIS — Z7901 Long term (current) use of anticoagulants: Secondary | ICD-10-CM | POA: Insufficient documentation

## 2016-11-10 DIAGNOSIS — G4730 Sleep apnea, unspecified: Secondary | ICD-10-CM | POA: Diagnosis not present

## 2016-11-10 DIAGNOSIS — R0609 Other forms of dyspnea: Secondary | ICD-10-CM | POA: Diagnosis not present

## 2016-11-10 DIAGNOSIS — Z79899 Other long term (current) drug therapy: Secondary | ICD-10-CM | POA: Insufficient documentation

## 2016-11-10 DIAGNOSIS — G4733 Obstructive sleep apnea (adult) (pediatric): Secondary | ICD-10-CM | POA: Insufficient documentation

## 2016-11-10 DIAGNOSIS — I10 Essential (primary) hypertension: Secondary | ICD-10-CM | POA: Insufficient documentation

## 2016-11-10 LAB — IRON AND TIBC
IRON: 157 ug/dL (ref 45–182)
Saturation Ratios: 48 % — ABNORMAL HIGH (ref 17.9–39.5)
TIBC: 326 ug/dL (ref 250–450)
UIBC: 169 ug/dL

## 2016-11-10 LAB — CBC WITH DIFFERENTIAL/PLATELET
BASOS PCT: 1 %
Basophils Absolute: 0.1 10*3/uL (ref 0–0.1)
EOS ABS: 0.3 10*3/uL (ref 0–0.7)
EOS PCT: 5 %
HCT: 47.4 % (ref 40.0–52.0)
HEMOGLOBIN: 16.2 g/dL (ref 13.0–18.0)
Lymphocytes Relative: 31 %
Lymphs Abs: 1.9 10*3/uL (ref 1.0–3.6)
MCH: 33.1 pg (ref 26.0–34.0)
MCHC: 34.3 g/dL (ref 32.0–36.0)
MCV: 96.7 fL (ref 80.0–100.0)
Monocytes Absolute: 0.6 10*3/uL (ref 0.2–1.0)
Monocytes Relative: 10 %
NEUTROS PCT: 53 %
Neutro Abs: 3.2 10*3/uL (ref 1.4–6.5)
PLATELETS: 191 10*3/uL (ref 150–440)
RBC: 4.9 MIL/uL (ref 4.40–5.90)
RDW: 13.1 % (ref 11.5–14.5)
WBC: 6.1 10*3/uL (ref 3.8–10.6)

## 2016-11-10 LAB — FERRITIN: FERRITIN: 469 ng/mL — AB (ref 24–336)

## 2016-11-17 DIAGNOSIS — E782 Mixed hyperlipidemia: Secondary | ICD-10-CM | POA: Diagnosis not present

## 2016-11-17 DIAGNOSIS — E349 Endocrine disorder, unspecified: Secondary | ICD-10-CM | POA: Diagnosis not present

## 2016-11-17 DIAGNOSIS — R7309 Other abnormal glucose: Secondary | ICD-10-CM | POA: Diagnosis not present

## 2016-11-17 DIAGNOSIS — Z79899 Other long term (current) drug therapy: Secondary | ICD-10-CM | POA: Diagnosis not present

## 2016-11-17 DIAGNOSIS — I10 Essential (primary) hypertension: Secondary | ICD-10-CM | POA: Diagnosis not present

## 2016-11-17 DIAGNOSIS — R972 Elevated prostate specific antigen [PSA]: Secondary | ICD-10-CM | POA: Diagnosis not present

## 2016-11-24 DIAGNOSIS — I10 Essential (primary) hypertension: Secondary | ICD-10-CM | POA: Diagnosis not present

## 2016-11-24 DIAGNOSIS — R42 Dizziness and giddiness: Secondary | ICD-10-CM | POA: Diagnosis not present

## 2016-11-24 DIAGNOSIS — I480 Paroxysmal atrial fibrillation: Secondary | ICD-10-CM | POA: Diagnosis not present

## 2016-11-24 DIAGNOSIS — K760 Fatty (change of) liver, not elsewhere classified: Secondary | ICD-10-CM | POA: Diagnosis not present

## 2016-11-24 DIAGNOSIS — Z6841 Body Mass Index (BMI) 40.0 and over, adult: Secondary | ICD-10-CM | POA: Diagnosis not present

## 2017-02-07 NOTE — Progress Notes (Deleted)
[place]  [phone]) [phone] Fax:(336) [phone]  ID: Collin Holt OB: [birth year]  MR#: [id number]  ZLD#:[id number]  Patient Care Team: [name], MD as PCP - General (Internal Medicine) [name], MD as Consulting Physician (Cardiology)  CHIEF COMPLAINT:  Hereditary hemochromatosis   INTERVAL HISTORY: Patient returns to clinic for routine follow-up and consideration of additional phlebotomy. He currently feels well and is asymptomatic. He has noted no dizziness or headaches. He has no neurologic complaints. He does not complain of weakness or fatigue. He denies any recent fevers or illnesses. He has a good appetite and denies weight loss. He has no chest pain or shortness of breath. He denies any nausea, vomiting, constipation, or diarrhea. He has no urinary complaints. Patient offers no specific complaints today.  REVIEW OF SYSTEMS:   Review of Systems  Constitutional: Negative for fever, malaise/fatigue and weight loss.  Respiratory: Negative.  Negative for cough and shortness of breath.   Cardiovascular: Negative.  Negative for chest pain and leg swelling.  Gastrointestinal: Negative.  Negative for abdominal pain, blood in stool and melena.  Genitourinary: Negative.   Musculoskeletal: Negative.   Skin: Negative for itching.  Neurological: Negative.  Negative for dizziness, weakness and headaches.  Psychiatric/Behavioral: Negative.  The patient is not nervous/anxious.     As per HPI. Otherwise, a complete review of systems is negative.  PAST MEDICAL HISTORY: Past Medical History:  Diagnosis Date  . Chronic headaches   . Diabetes mellitus without complication ([place])   . DOE (dyspnea on exertion)    a. 02/2010 Cath: nl cors;  b. 02/2010 CPX: good fxnl capacity.  Marland Kitchen HTN (hypertension)   . OSA (obstructive sleep apnea)   . PAF (paroxysmal atrial fibrillation) ([place])    a. 2012 Echo: nl LV fxn, mod dil LA.  . Sleep apnea     PAST SURGICAL  HISTORY: Past Surgical History:  Procedure Laterality Date  . SHOULDER SURGERY     Right x 2    FAMILY HISTORY Family History  Problem Relation Age of Onset  . Emphysema Father   . Cancer Father   . Prostate cancer Neg Hx   . Colon cancer Neg Hx   . Coronary artery disease Neg Hx   . Diabetes Neg Hx        ADVANCED DIRECTIVES:    HEALTH MAINTENANCE: Social History   Tobacco Use  . Smoking status: Former Smoker    Packs/day: 1.00    Years: 10.00    Pack years: 10.00    Types: Cigarettes    Last attempt to quit: 01/19/1999    Years since quitting: 18.0  . Smokeless tobacco: Former Systems developer    Quit date: 01/19/2011  Substance Use Topics  . Alcohol use: Yes    Alcohol/week: 2.4 oz    Types: 4 Glasses of wine per week  . Drug use: No     Allergies  Allergen Reactions  . Morphine And Related Nausea And Vomiting  . Terfenadine Other (See Comments)    Irregular heartbeat    Current Outpatient Medications  Medication Sig Dispense Refill  . amiodarone (PACERONE) 200 MG tablet Take 1 tablet (200 mg total) by mouth daily as needed. 90 tablet 3  . metoprolol succinate (TOPROL-XL) 50 MG 24 hr tablet Take 1 tablet (50 mg total) by mouth daily. Take with or immediately following a meal. 90 tablet 4  . rivaroxaban (XARELTO) 20 MG TABS tablet Take 1 tablet (20 mg total) by mouth  daily with supper. 90 tablet 4  . SUMAtriptan (IMITREX) 100 MG tablet Take 1 tablet by mouth daily as needed.    . testosterone cypionate (DEPOTESTOSTERONE CYPIONATE) 200 MG/ML injection Inject 200 mg into the muscle every 14 (fourteen) days.     Marland Kitchen venlafaxine (EFFEXOR) 75 MG tablet Take 75 mg by mouth daily.       Current Facility-Administered Medications  Medication Dose Route Frequency Provider Last Rate Last Dose  . promethazine (PHENERGAN) injection 25 mg  25 mg Intravenous Q6H PRN Janith Lima, MD   25 mg at 05/01/10 1209    OBJECTIVE: There were no vitals filed for this visit.   There is no  height or weight on file to calculate BMI.    ECOG FS:0 - Asymptomatic  General: Well-developed, well-nourished, no acute distress. Eyes: Pink conjunctiva, anicteric sclera. Lungs: Clear to auscultation bilaterally. Heart: Regular rate and rhythm. No rubs, murmurs, or gallops. Abdomen: Soft, nontender, nondistended. No organomegaly noted, normoactive bowel sounds. Musculoskeletal: No edema, cyanosis, or clubbing. Neuro: Alert, answering all questions appropriately. Cranial nerves grossly intact. Skin: No rashes or petechiae noted. Psych: Normal affect.   LAB RESULTS:  Lab Results  Component Value Date   NA 139 07/20/2013   K 4.2 07/20/2013   CL 105 07/20/2013   CO2 26 07/20/2013   GLUCOSE 104 (H) 07/20/2013   BUN 11 07/20/2013   CREATININE 0.95 07/20/2013   CALCIUM 9.0 07/20/2013   PROT 7.4 07/19/2013   ALBUMIN 3.7 07/19/2013   AST 29 07/19/2013   ALT 32 07/19/2013   ALKPHOS 65 07/19/2013   BILITOT 1.1 (H) 07/19/2013   GFRNONAA >60 07/20/2013   GFRAA >60 07/20/2013    Lab Results  Component Value Date   WBC 6.1 11/10/2016   NEUTROABS 3.2 11/10/2016   HGB 16.2 11/10/2016   HCT 47.4 11/10/2016   MCV 96.7 11/10/2016   PLT 191 11/10/2016   Lab Results  Component Value Date   FERRITIN 469 (H) 11/10/2016     STUDIES: No results found.  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  PLAN:    1. Hereditary hemochromatosis: Patient's hemoglobin and iron stores have trended up, therefore will proceed with 400 mL phlebotomy today. Goal ferritin will be between 50 and 100. Return to clinic in 3 months for laboratory work and phlebotomy and then in 6 months for laboratory work and further evaluation.  2. Atrial fibrillation: Continue Xarelto as ordered. 3. Hypertension: Patient's blood pressure is within normal limits today, continue current medications as prescribed.   [name], MD   02/07/2017 12:43 AM

## 2017-02-09 ENCOUNTER — Inpatient Hospital Stay: Payer: Medicare HMO | Admitting: Oncology

## 2017-02-09 ENCOUNTER — Inpatient Hospital Stay: Payer: Medicare HMO

## 2017-02-23 ENCOUNTER — Ambulatory Visit (INDEPENDENT_AMBULATORY_CARE_PROVIDER_SITE_OTHER): Payer: Medicare HMO

## 2017-02-23 ENCOUNTER — Encounter: Payer: Self-pay | Admitting: Podiatry

## 2017-02-23 ENCOUNTER — Ambulatory Visit: Payer: Medicare HMO | Admitting: Podiatry

## 2017-02-23 VITALS — BP 135/76 | HR 67 | Resp 16

## 2017-02-23 DIAGNOSIS — I10 Essential (primary) hypertension: Secondary | ICD-10-CM | POA: Insufficient documentation

## 2017-02-23 DIAGNOSIS — I499 Cardiac arrhythmia, unspecified: Secondary | ICD-10-CM | POA: Insufficient documentation

## 2017-02-23 DIAGNOSIS — R51 Headache: Secondary | ICD-10-CM

## 2017-02-23 DIAGNOSIS — J309 Allergic rhinitis, unspecified: Secondary | ICD-10-CM | POA: Insufficient documentation

## 2017-02-23 DIAGNOSIS — J189 Pneumonia, unspecified organism: Secondary | ICD-10-CM | POA: Insufficient documentation

## 2017-02-23 DIAGNOSIS — F419 Anxiety disorder, unspecified: Secondary | ICD-10-CM | POA: Insufficient documentation

## 2017-02-23 DIAGNOSIS — M722 Plantar fascial fibromatosis: Secondary | ICD-10-CM | POA: Diagnosis not present

## 2017-02-23 DIAGNOSIS — G8929 Other chronic pain: Secondary | ICD-10-CM | POA: Insufficient documentation

## 2017-02-23 MED ORDER — METHYLPREDNISOLONE 4 MG PO TBPK: ORAL_TABLET | ORAL | 0 refills | Status: DC

## 2017-02-23 NOTE — Patient Instructions (Signed)

## 2017-02-23 NOTE — Progress Notes (Signed)
Subjective:  Patient ID: Collin Holt, male    DOB: [birth year],  MRN: [id number] HPI Chief Complaint  Patient presents with  . Foot Pain    Plantar heel right - aching x 1 year, Dr. Vickki Muff at [facility] treating - 3 injections, wore boot-no help    57 y.o. male presents with the above complaint.     Past Medical History:  Diagnosis Date  . Chronic headaches   . Diabetes mellitus without complication ([place])   . DOE (dyspnea on exertion)    a. 02/2010 Cath: nl cors;  b. 02/2010 CPX: good fxnl capacity.  Marland Kitchen HTN (hypertension)   . OSA (obstructive sleep apnea)   . PAF (paroxysmal atrial fibrillation) ([place])    a. 2012 Echo: nl LV fxn, mod dil LA.  . Sleep apnea    Past Surgical History:  Procedure Laterality Date  . SHOULDER SURGERY     Right x 2    Current Outpatient Medications:  .  SUMAtriptan (IMITREX) 100 MG tablet, Take by mouth., Disp: , Rfl:  .  amiodarone (PACERONE) 200 MG tablet, Take 1 tablet (200 mg total) by mouth daily as needed., Disp: 90 tablet, Rfl: 3 .  metoprolol succinate (TOPROL-XL) 50 MG 24 hr tablet, Take 1 tablet (50 mg total) by mouth daily. Take with or immediately following a meal., Disp: 90 tablet, Rfl: 4 .  rivaroxaban (XARELTO) 20 MG TABS tablet, Take 1 tablet (20 mg total) by mouth daily with supper., Disp: 90 tablet, Rfl: 4 .  testosterone cypionate (DEPOTESTOSTERONE CYPIONATE) 200 MG/ML injection, Inject 200 mg into the muscle every 14 (fourteen) days. , Disp: , Rfl:  .  venlafaxine (EFFEXOR) 75 MG tablet, Take 75 mg by mouth daily.  , Disp: , Rfl:   Allergies  Allergen Reactions  . Morphine And Related Nausea And Vomiting  . Terfenadine Other (See Comments)    Irregular heartbeat   Review of Systems  HENT: Positive for hearing loss, sinus pressure and tinnitus.   Neurological: Positive for weakness and headaches.  All other systems reviewed and are negative.  Objective:   Vitals:   02/23/17 1358  BP: 135/76  Pulse: 67  Resp: 16     General: Well developed, nourished, in no acute distress, alert and oriented x3   Dermatological: Skin is warm, dry and supple bilateral. Nails x 10 are well maintained; remaining integument appears unremarkable at this time. There are no open sores, no preulcerative lesions, no rash or signs of infection present.  Vascular: Dorsalis Pedis artery and Posterior Tibial artery pedal pulses are 2/4 bilateral with immedate capillary fill time. Pedal hair growth present. No varicosities and no lower extremity edema present bilateral.   Neruologic: Grossly intact via light touch bilateral. Vibratory intact via tuning fork bilateral. Protective threshold with Semmes Wienstein monofilament intact to all pedal sites bilateral. Patellar and Achilles deep tendon reflexes 2+ bilateral. No Babinski or clonus noted bilateral.   Musculoskeletal: No gross boney pedal deformities bilateral. No pain, crepitus, or limitation noted with foot and ankle range of motion bilateral. Muscular strength 5/5 in all groups tested bilateral.  Gait: Unassisted, Nonantalgic.    Radiographs:  Demonstrates a plantar distally oriented calcaneal spur and a posterior calcaneal heel spur proximally oriented left.  No fractures identified.  No acute trauma.  Assessment & Plan:   Assessment: Chronic intractable plantar fasciitis right foot.   Plan: Discussed etiology pathology conservative versus surgical therapies.  At this point I injected the right heel with 20  mg of Kenalog 5 mg Marcaine to the point of maximal tenderness.  He tolerated procedure well without complications.  Placement plantar fascial brace and a night splint.  Started him on Medrol Dosepak.  He is currently on a blood thinner so cannot give him an NSAID.  Follow-up with him in 1 month.  We discussed appropriate shoe gear stretching exercises ice therapy as your modifications.     Odas Ozer T. [place], [place]

## 2017-02-25 NOTE — Progress Notes (Signed)
[facility]  Telephone:(336) [phone] Fax:(336) [phone]  ID: Collin Holt OB: 09-Sep-1960  MR#: [id number]  ZTI#:[id number]  Patient Care Team: [name], MD as PCP - General (Internal Medicine) [name], MD as Consulting Physician (Cardiology)  CHIEF COMPLAINT:  Hereditary hemochromatosis   INTERVAL HISTORY: Patient returns to clinic for routine follow-up and consideration of additional phlebotomy. He currently feels well and is asymptomatic. He has noted no dizziness or headaches. He has no neurologic complaints. He does not complain of weakness or fatigue. He denies any recent fevers or illnesses. He has a good appetite and denies weight loss. He has no chest pain or shortness of breath. He denies any nausea, vomiting, constipation, or diarrhea. He has no urinary complaints. Patient offers no specific complaints today.  REVIEW OF SYSTEMS:   Review of Systems  Constitutional: Negative for fever, malaise/fatigue and weight loss.  Respiratory: Negative.  Negative for cough and shortness of breath.   Cardiovascular: Negative.  Negative for chest pain and leg swelling.  Gastrointestinal: Negative.  Negative for abdominal pain, blood in stool and melena.  Genitourinary: Negative.   Musculoskeletal: Negative.   Skin: Negative for itching.  Neurological: Negative.  Negative for dizziness, weakness and headaches.  Psychiatric/Behavioral: Negative.  The patient is not nervous/anxious.     As per HPI. Otherwise, a complete review of systems is negative.  PAST MEDICAL HISTORY: Past Medical History:  Diagnosis Date  . Chronic headaches   . Diabetes mellitus without complication ([place])   . DOE (dyspnea on exertion)    a. 02/2010 Cath: nl cors;  b. 02/2010 CPX: good fxnl capacity.  Marland Kitchen HTN (hypertension)   . OSA (obstructive sleep apnea)   . PAF (paroxysmal atrial fibrillation) ([place])    a. 2012 Echo: nl LV fxn, mod dil LA.  . Sleep apnea     PAST SURGICAL  HISTORY: Past Surgical History:  Procedure Laterality Date  . SHOULDER SURGERY     Right x 2    FAMILY HISTORY Family History  Problem Relation Age of Onset  . Emphysema Father   . Cancer Father   . Prostate cancer Neg Hx   . Colon cancer Neg Hx   . Coronary artery disease Neg Hx   . Diabetes Neg Hx        ADVANCED DIRECTIVES:    HEALTH MAINTENANCE: Social History   Tobacco Use  . Smoking status: Former Smoker    Packs/day: 1.00    Years: 10.00    Pack years: 10.00    Types: Cigarettes    Last attempt to quit: 01/19/1999    Years since quitting: 18.1  . Smokeless tobacco: Former Systems developer    Quit date: 01/19/2011  Substance Use Topics  . Alcohol use: Yes    Alcohol/week: 2.4 oz    Types: 4 Glasses of wine per week  . Drug use: No     Allergies  Allergen Reactions  . Morphine And Related Nausea And Vomiting  . Terfenadine Other (See Comments)    Irregular heartbeat    Current Outpatient Medications  Medication Sig Dispense Refill  . amiodarone (PACERONE) 200 MG tablet Take 1 tablet (200 mg total) by mouth daily as needed. 90 tablet 3  . methylPREDNISolone (MEDROL DOSEPAK) 4 MG TBPK tablet 6 day dose pack - take as directed 21 tablet 0  . metoprolol succinate (TOPROL-XL) 50 MG 24 hr tablet Take 1 tablet (50 mg total) by mouth daily. Take with or immediately following a  meal. 90 tablet 4  . rivaroxaban (XARELTO) 20 MG TABS tablet Take 1 tablet (20 mg total) by mouth daily with supper. 90 tablet 4  . SUMAtriptan (IMITREX) 100 MG tablet Take by mouth.    . testosterone cypionate (DEPOTESTOSTERONE CYPIONATE) 200 MG/ML injection Inject 200 mg into the muscle every 14 (fourteen) days.     Marland Kitchen venlafaxine (EFFEXOR) 75 MG tablet Take 75 mg by mouth daily.       No current facility-administered medications for this visit.     OBJECTIVE: Vitals:   02/28/17 1426  BP: 130/80  Pulse: (!) 54  Resp: 18  Temp: (!) 97.1 F (36.2 C)     Body mass index is 40.59 kg/m.    ECOG  FS:0 - Asymptomatic  General: Well-developed, well-nourished, no acute distress. Eyes: Pink conjunctiva, anicteric sclera. Lungs: Clear to auscultation bilaterally. Heart: Regular rate and rhythm. No rubs, murmurs, or gallops. Abdomen: Soft, nontender, nondistended. No organomegaly noted, normoactive bowel sounds. Musculoskeletal: No edema, cyanosis, or clubbing. Neuro: Alert, answering all questions appropriately. Cranial nerves grossly intact. Skin: No rashes or petechiae noted. Psych: Normal affect.   LAB RESULTS:  Lab Results  Component Value Date   NA 139 07/20/2013   K 4.2 07/20/2013   CL 105 07/20/2013   CO2 26 07/20/2013   GLUCOSE 104 (H) 07/20/2013   BUN 11 07/20/2013   CREATININE 0.95 07/20/2013   CALCIUM 9.0 07/20/2013   PROT 7.4 07/19/2013   ALBUMIN 3.7 07/19/2013   AST 29 07/19/2013   ALT 32 07/19/2013   ALKPHOS 65 07/19/2013   BILITOT 1.1 (H) 07/19/2013   GFRNONAA >60 07/20/2013   GFRAA >60 07/20/2013    Lab Results  Component Value Date   WBC 5.6 02/28/2017   NEUTROABS 2.7 02/28/2017   HGB 16.3 02/28/2017   HCT 47.9 02/28/2017   MCV 96.4 02/28/2017   PLT 194 02/28/2017   Lab Results  Component Value Date   FERRITIN 766 (H) 02/28/2017     STUDIES: Dg Foot Complete Right  Result Date: 02/23/2017 Please see detailed radiograph report in office note.   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  PLAN:    1. Hereditary hemochromatosis: Patient's hemoglobin and iron stores have trended up, therefore will continue to require phlebotomy.  Patient has agreed to  return to clinic in 2 weeks to have his phlebotomy done by the [facility].  He will require 400 mL of phlebotomy with goal ferritin between 50 and 100.  Given his rising ferritin, patient may require more frequent phlebotomy.  Return to clinic in 3 months for laboratory work and phlebotomy and then in 6 months for laboratory work and further evaluation.  2.  Atrial fibrillation: Continue Xarelto as ordered. 3. Hypertension: Patient's blood pressure is within normal limits today, continue current medications as prescribed.   [name], MD   03/01/2017 4:36 PM

## 2017-02-28 ENCOUNTER — Inpatient Hospital Stay (HOSPITAL_BASED_OUTPATIENT_CLINIC_OR_DEPARTMENT_OTHER): Payer: Medicare HMO | Admitting: Oncology

## 2017-02-28 ENCOUNTER — Inpatient Hospital Stay: Payer: Medicare HMO

## 2017-02-28 ENCOUNTER — Inpatient Hospital Stay: Payer: Medicare HMO | Attending: Oncology

## 2017-02-28 DIAGNOSIS — I4891 Unspecified atrial fibrillation: Secondary | ICD-10-CM

## 2017-02-28 DIAGNOSIS — I10 Essential (primary) hypertension: Secondary | ICD-10-CM

## 2017-02-28 DIAGNOSIS — Z7901 Long term (current) use of anticoagulants: Secondary | ICD-10-CM | POA: Diagnosis not present

## 2017-02-28 LAB — CBC WITH DIFFERENTIAL/PLATELET
Basophils Absolute: 0.1 10*3/uL (ref 0–0.1)
Basophils Relative: 1 %
EOS ABS: 0.2 10*3/uL (ref 0–0.7)
Eosinophils Relative: 4 %
HEMATOCRIT: 47.9 % (ref 40.0–52.0)
HEMOGLOBIN: 16.3 g/dL (ref 13.0–18.0)
LYMPHS ABS: 1.8 10*3/uL (ref 1.0–3.6)
LYMPHS PCT: 32 %
MCH: 32.8 pg (ref 26.0–34.0)
MCHC: 34 g/dL (ref 32.0–36.0)
MCV: 96.4 fL (ref 80.0–100.0)
MONOS PCT: 14 %
Monocytes Absolute: 0.8 10*3/uL (ref 0.2–1.0)
NEUTROS ABS: 2.7 10*3/uL (ref 1.4–6.5)
NEUTROS PCT: 49 %
Platelets: 194 10*3/uL (ref 150–440)
RBC: 4.97 MIL/uL (ref 4.40–5.90)
RDW: 13.1 % (ref 11.5–14.5)
WBC: 5.6 10*3/uL (ref 3.8–10.6)

## 2017-02-28 LAB — IRON AND TIBC
Iron: 147 ug/dL (ref 45–182)
SATURATION RATIOS: 46 % — AB (ref 17.9–39.5)
TIBC: 322 ug/dL (ref 250–450)
UIBC: 175 ug/dL

## 2017-02-28 LAB — FERRITIN: Ferritin: 766 ng/mL — ABNORMAL HIGH (ref 24–336)

## 2017-02-28 NOTE — Progress Notes (Signed)
Pt in today for follow up of hemochromatosis.  Pt denies any difficulties or concerns at this time.

## 2017-03-09 ENCOUNTER — Inpatient Hospital Stay: Payer: Medicare HMO

## 2017-03-29 ENCOUNTER — Other Ambulatory Visit: Payer: Self-pay | Admitting: *Deleted

## 2017-03-29 MED ORDER — METOPROLOL SUCCINATE ER 50 MG PO TB24: 50.0000 mg | ORAL_TABLET | Freq: Every day | ORAL | 2 refills | Status: DC

## 2017-03-29 NOTE — Telephone Encounter (Signed)
90 day Refill Request.

## 2017-03-30 ENCOUNTER — Other Ambulatory Visit: Payer: Self-pay

## 2017-03-30 ENCOUNTER — Ambulatory Visit: Payer: Medicare HMO | Admitting: Podiatry

## 2017-03-30 DIAGNOSIS — Z7901 Long term (current) use of anticoagulants: Secondary | ICD-10-CM

## 2017-03-30 MED ORDER — RIVAROXABAN 20 MG PO TABS: 20.0000 mg | ORAL_TABLET | Freq: Every day | ORAL | 4 refills | Status: DC

## 2017-05-25 ENCOUNTER — Inpatient Hospital Stay: Payer: Medicare HMO | Attending: Oncology

## 2017-05-25 ENCOUNTER — Other Ambulatory Visit: Payer: Self-pay

## 2017-05-25 ENCOUNTER — Inpatient Hospital Stay: Payer: Medicare HMO

## 2017-05-25 LAB — CBC WITH DIFFERENTIAL/PLATELET
BASOS ABS: 0.1 10*3/uL (ref 0–0.1)
BASOS PCT: 1 %
EOS ABS: 0.3 10*3/uL (ref 0–0.7)
EOS PCT: 5 %
HEMATOCRIT: 47.8 % (ref 40.0–52.0)
Hemoglobin: 16.8 g/dL (ref 13.0–18.0)
Lymphocytes Relative: 28 %
Lymphs Abs: 1.5 10*3/uL (ref 1.0–3.6)
MCH: 33.7 pg (ref 26.0–34.0)
MCHC: 35.1 g/dL (ref 32.0–36.0)
MCV: 96.2 fL (ref 80.0–100.0)
MONO ABS: 0.4 10*3/uL (ref 0.2–1.0)
MONOS PCT: 7 %
NEUTROS ABS: 3.2 10*3/uL (ref 1.4–6.5)
Neutrophils Relative %: 59 %
PLATELETS: 182 10*3/uL (ref 150–440)
RBC: 4.97 MIL/uL (ref 4.40–5.90)
RDW: 12.4 % (ref 11.5–14.5)
WBC: 5.4 10*3/uL (ref 3.8–10.6)

## 2017-05-25 LAB — IRON AND TIBC
Iron: 193 ug/dL — ABNORMAL HIGH (ref 45–182)
SATURATION RATIOS: 63 % — AB (ref 17.9–39.5)
TIBC: 308 ug/dL (ref 250–450)
UIBC: 115 ug/dL

## 2017-05-25 LAB — FERRITIN: FERRITIN: 542 ng/mL — AB (ref 24–336)

## 2017-05-25 NOTE — Progress Notes (Signed)
Pt had order for phlebotomy for ferritin greater than 100, due to extended turn around time for the ferritn level, spoke with Dr Grayland Ormond, ok to do phlebotomy with the hgb and hct results.

## 2017-06-22 IMAGING — CT CT ABD-PELV W/ CM
2 of 5 series · 16 of 46 positions shown, 18 images · IV contrast (omnipaque)
Comparison: None.

CLINICAL DATA: 55-year-old with new diagnosis of hemochromatosis.

EXAM:
CT ABDOMEN AND PELVIS WITH CONTRAST
TECHNIQUE: Multidetector CT imaging of the abdomen and pelvis was performed
using the standard protocol following bolus administration of
intravenous contrast.
CONTRAST:  100mL OMNIPAQUE IOHEXOL 350 MG/ML IV. Oral contrast was
also administered.

[Series 2: routine with · axial · 0.88mm/px · z∈[-1134,-639]mm · 13 of 111 slices shown, 15 images]
[im 6/111  soft-tissue]
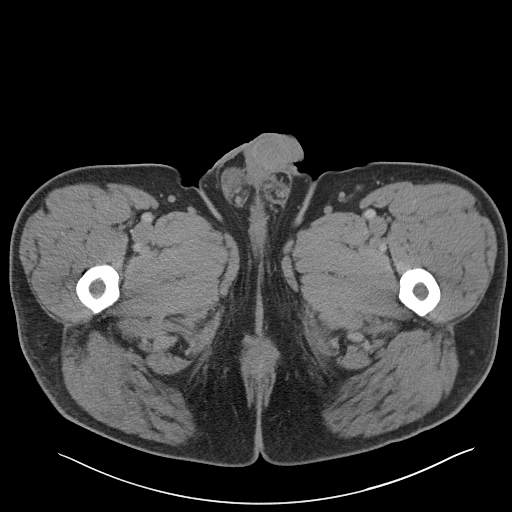
[im 6/111  bone]
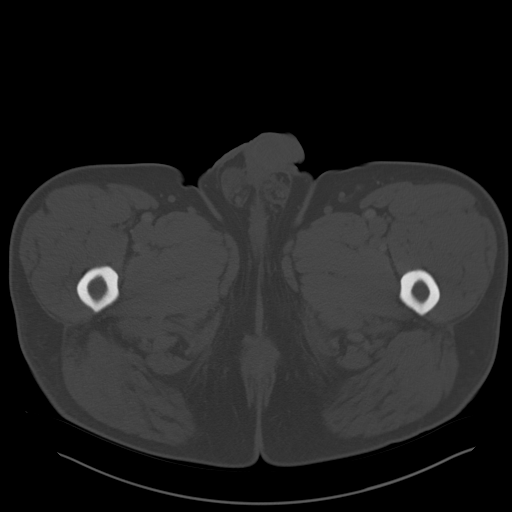
[im 18/111  soft-tissue]
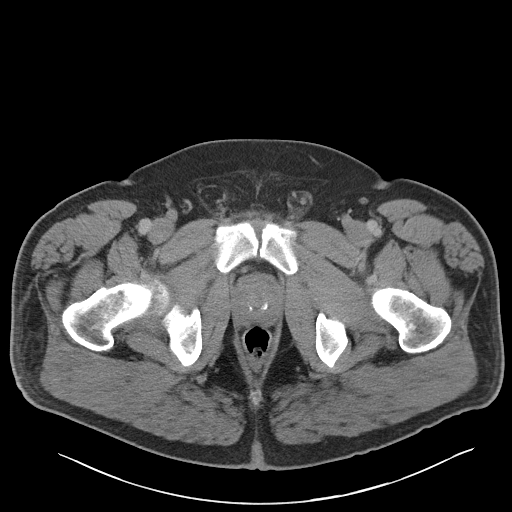
[im 24/111  soft-tissue]
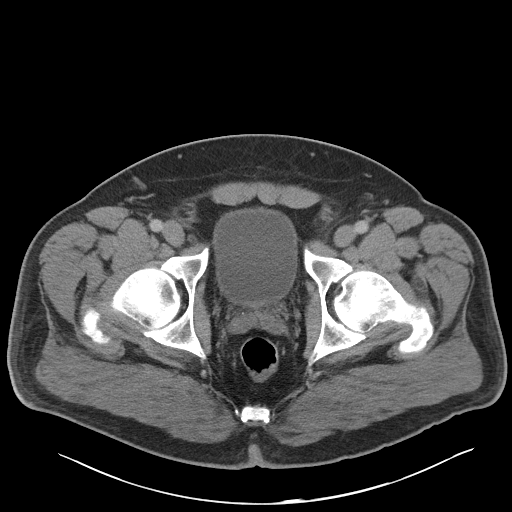
[im 29/111  soft-tissue]
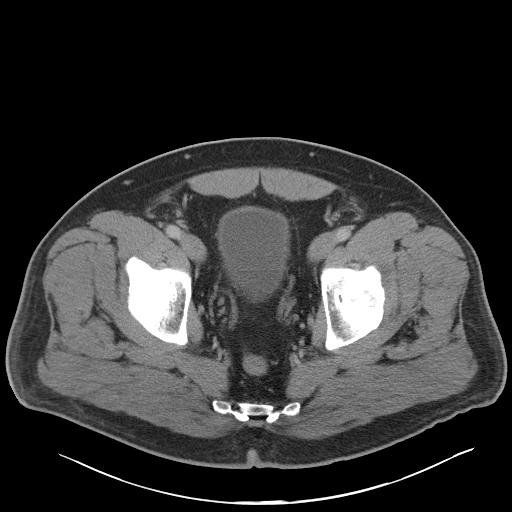
[im 41/111  soft-tissue]
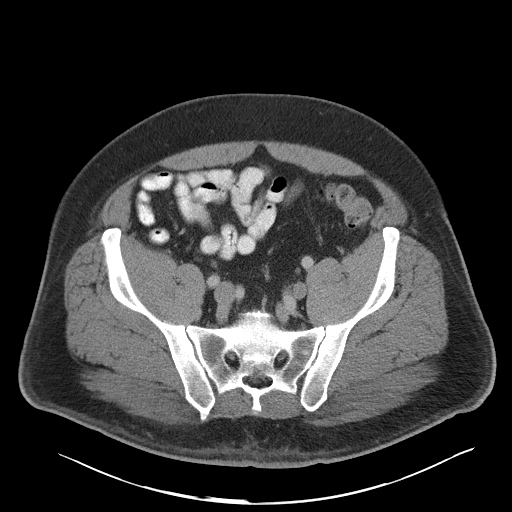
[im 47/111  soft-tissue]
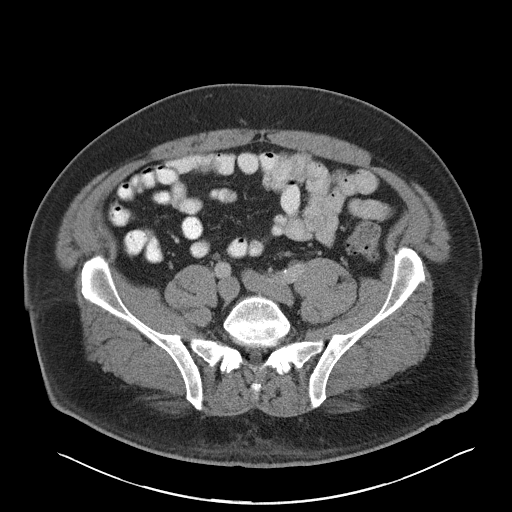
[im 58/111  soft-tissue]
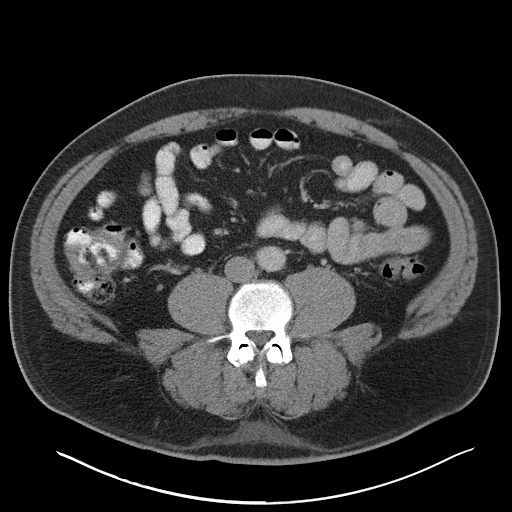
[im 64/111  soft-tissue]
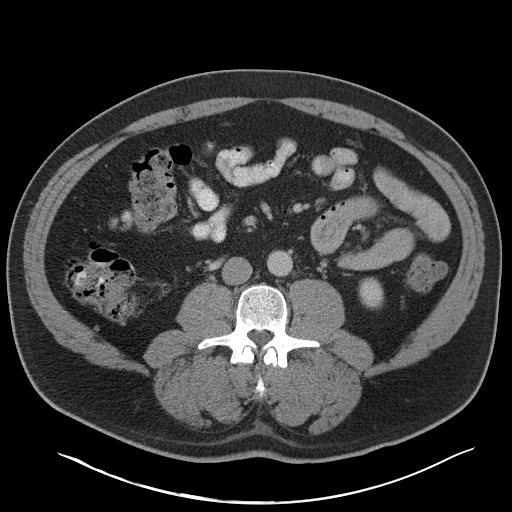
[im 70/111  soft-tissue]
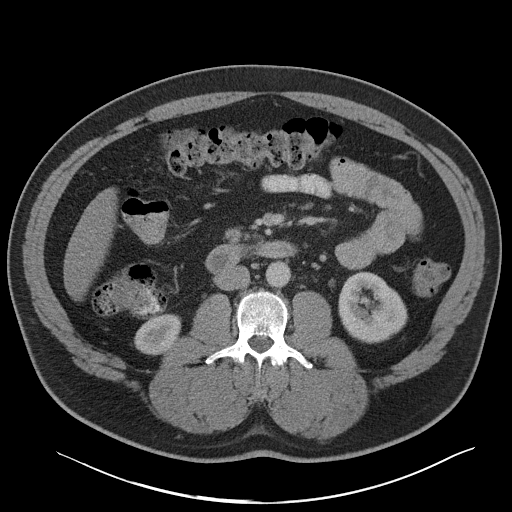
[im 70/111  bone]
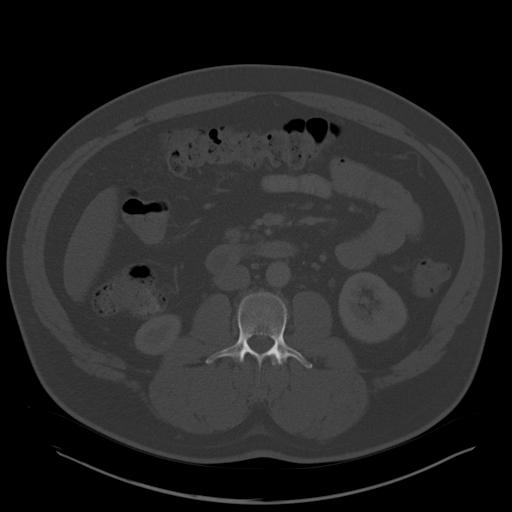
[im 82/111  soft-tissue]
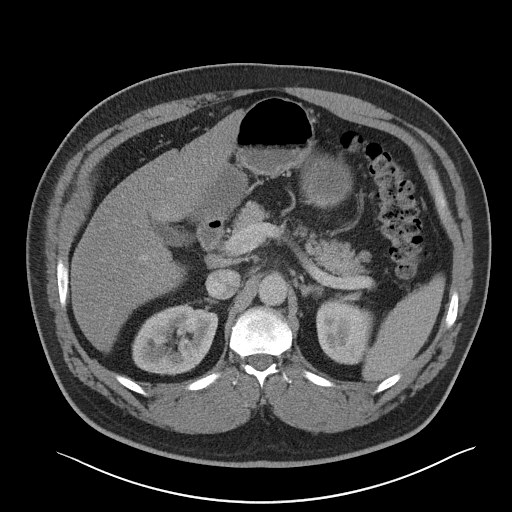
[im 87/111  soft-tissue]
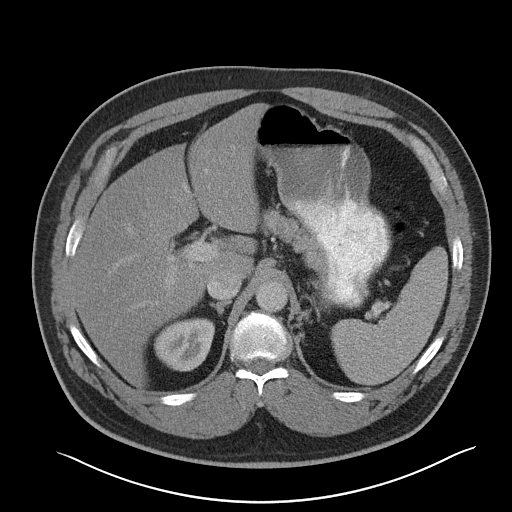
[im 93/111  soft-tissue]
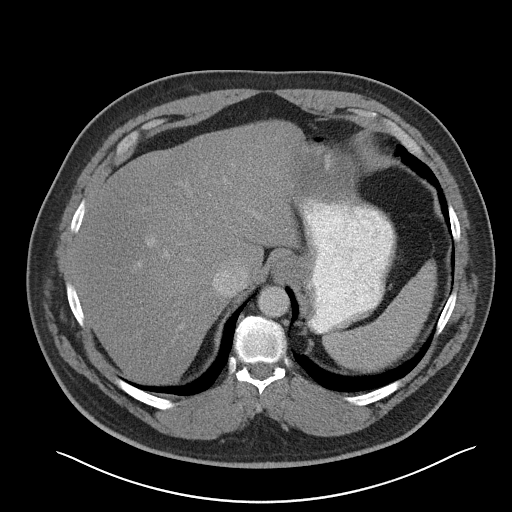
[im 105/111  soft-tissue]
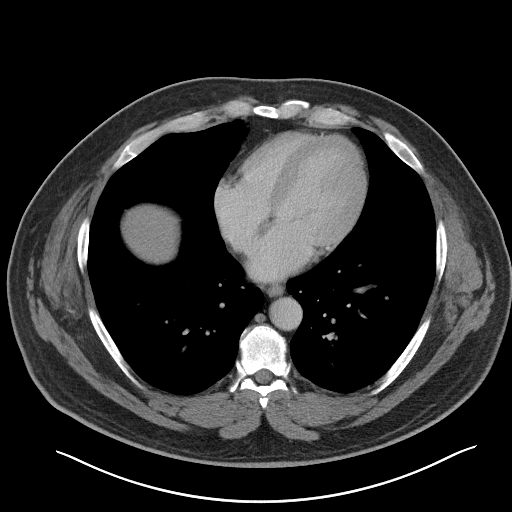

[Series 6: cor routine with · coronal · 0.96mm/px · 3 of 167 slices shown]
[im 56/167  soft-tissue]
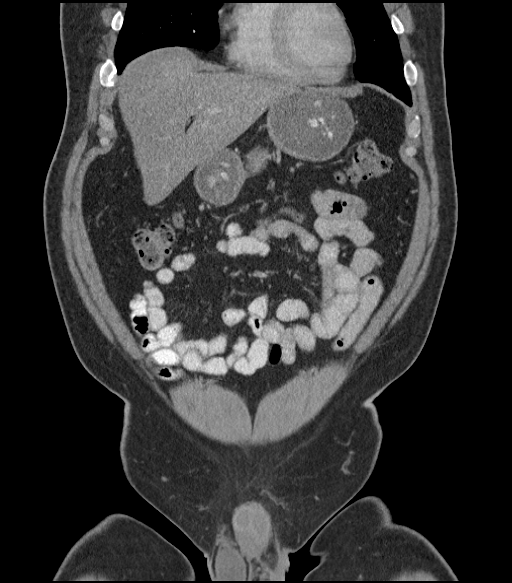
[im 74/167  soft-tissue]
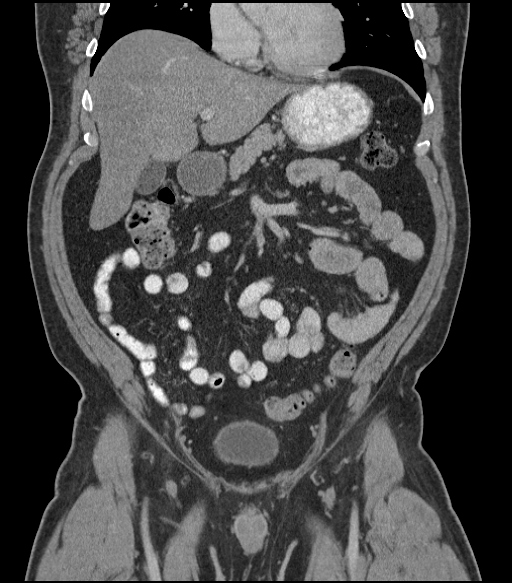
[im 93/167  soft-tissue]
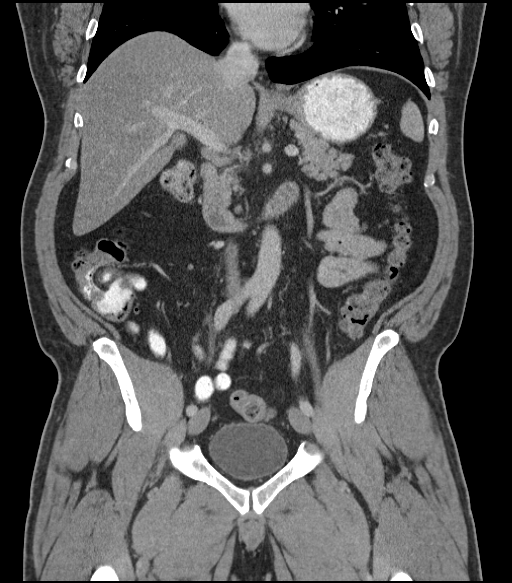

[16 of 46 positions shown; findings below may reference images not displayed]

FINDINGS: Lower chest: Minimal pleural thickening posteriorly at the base of
the left thorax. Visualized lung bases clear. Heart size normal.

Hepatobiliary: Diffuse hepatic steatosis and/or hepatocellular
disease without focal hepatic parenchymal abnormality. Liver normal
in size. Gallbladder normal in appearance without calcified
gallstones. No biliary ductal dilation.

Pancreas: Normal in appearance without evidence of mass, ductal
dilation, or inflammation.

Spleen: Normal in size and appearance.

Adrenals/Urinary Tract: Normal appearing adrenal glands.
Subcentimeter cortical cyst in the mid right kidney. No significant
abnormality involving either kidney. No urinary tract calculi. No
hydronephrosis. Normal-appearing urinary bladder.

Stomach/Bowel: Stomach normal in appearance for the degree of
distention. Normal-appearing small bowel. Moderate stool burden
throughout normal appearing colon. Normal appendix in the right
upper pelvis. No ascites.

Vascular/Lymphatic: Mild left common iliac artery atherosclerosis.
No visible atherosclerosis elsewhere. Widely patent visceral
arteries. Accessory lower pole right renal artery. No pathologic
lymphadenopathy.

Reproductive: Prostate gland upper normal in size, containing
calcifications. Normal seminal vesicles. Possible left varicocele.

Other: Very small right inguinal hernia containing fat.

Musculoskeletal: Mild lower thoracic spondylosis. Moderate facet
degenerative changes at L3-4 and mild facet degenerative changes at
L4-5.
IMPRESSION: 1. Diffuse hepatic steatosis and/or hepatocellular disease without
focal hepatic parenchymal abnormality. Normal sized liver.
2. No acute abnormalities involving the abdomen or pelvis.
3. Very small right inguinal hernia containing fat.

## 2017-07-14 DIAGNOSIS — M109 Gout, unspecified: Secondary | ICD-10-CM | POA: Diagnosis not present

## 2017-07-14 DIAGNOSIS — W57XXXA Bitten or stung by nonvenomous insect and other nonvenomous arthropods, initial encounter: Secondary | ICD-10-CM | POA: Diagnosis not present

## 2017-07-14 DIAGNOSIS — S30860A Insect bite (nonvenomous) of lower back and pelvis, initial encounter: Secondary | ICD-10-CM | POA: Diagnosis not present

## 2017-08-28 NOTE — Progress Notes (Deleted)
[place]  Telephone:([phone]) [phone] [phone]) [phone]  ID: [name] OB: [birth year]  MR#: [id number]  JME#:[id number]  Patient Care Team: [name], MD as PCP - General (Internal Medicine) [name], MD as Consulting Physician (Cardiology)  CHIEF COMPLAINT:  Hereditary hemochromatosis   INTERVAL HISTORY: Patient returns to clinic for routine follow-up and consideration of additional phlebotomy. He currently feels well and is asymptomatic. He has noted no dizziness or headaches. He has no neurologic complaints. He does not complain of weakness or fatigue. He denies any recent fevers or illnesses. He has a good appetite and denies weight loss. He has no chest pain or shortness of breath. He denies any nausea, vomiting, constipation, or diarrhea. He has no urinary complaints. Patient offers no specific complaints today.  REVIEW OF SYSTEMS:   Review of Systems  Constitutional: Negative for fever, malaise/fatigue and weight loss.  Respiratory: Negative.  Negative for cough and shortness of breath.   Cardiovascular: Negative.  Negative for chest pain and leg swelling.  Gastrointestinal: Negative.  Negative for abdominal pain, blood in stool and melena.  Genitourinary: Negative.   Musculoskeletal: Negative.   Skin: Negative for itching.  Neurological: Negative.  Negative for dizziness, weakness and headaches.  Psychiatric/Behavioral: Negative.  The patient is not nervous/anxious.     As per HPI. Otherwise, a complete review of systems is negative.  PAST MEDICAL HISTORY: Past Medical History:  Diagnosis Date  . Chronic headaches   . Diabetes mellitus without complication ([place])   . DOE (dyspnea on exertion)    a. 02/2010 Cath: nl cors;  b. 02/2010 CPX: good fxnl capacity.  Marland Kitchen HTN (hypertension)   . OSA (obstructive sleep apnea)   . PAF (paroxysmal atrial fibrillation) ([place])    a. 2012 Echo: nl LV fxn, mod dil LA.  . Sleep apnea     PAST SURGICAL  HISTORY: Past Surgical History:  Procedure Laterality Date  . SHOULDER SURGERY     Right x 2    FAMILY HISTORY Family History  Problem Relation Age of Onset  . Emphysema Father   . Cancer Father   . Prostate cancer Neg Hx   . Colon cancer Neg Hx   . Coronary artery disease Neg Hx   . Diabetes Neg Hx        ADVANCED DIRECTIVES:    HEALTH MAINTENANCE: Social History   Tobacco Use  . Smoking status: Former Smoker    Packs/day: 1.00    Years: 10.00    Pack years: 10.00    Types: Cigarettes    Last attempt to quit: 01/19/1999    Years since quitting: 18.6  . Smokeless tobacco: Former Systems developer    Quit date: 01/19/2011  Substance Use Topics  . Alcohol use: Yes    Alcohol/week: 4.0 standard drinks    Types: 4 Glasses of wine per week  . Drug use: No     Allergies  Allergen Reactions  . Morphine And Related Nausea And Vomiting  . Terfenadine Other (See Comments)    Irregular heartbeat    Current Outpatient Medications  Medication Sig Dispense Refill  . amiodarone (PACERONE) 200 MG tablet Take 1 tablet (200 mg total) by mouth daily as needed. 90 tablet 3  . methylPREDNISolone (MEDROL DOSEPAK) 4 MG TBPK tablet 6 day dose pack - take as directed 21 tablet 0  . metoprolol succinate (TOPROL-XL) 50 MG 24 hr tablet Take 1 tablet (50 mg total) by mouth daily. Take with or immediately following  a meal. 90 tablet 2  . rivaroxaban (XARELTO) 20 MG TABS tablet Take 1 tablet (20 mg total) by mouth daily with supper. 90 tablet 4  . SUMAtriptan (IMITREX) 100 MG tablet Take by mouth.    . testosterone cypionate (DEPOTESTOSTERONE CYPIONATE) 200 MG/ML injection Inject 200 mg into the muscle every 14 (fourteen) days.     Marland Kitchen venlafaxine (EFFEXOR) 75 MG tablet Take 75 mg by mouth daily.       No current facility-administered medications for this visit.     OBJECTIVE: There were no vitals filed for this visit.   There is no height or weight on file to calculate BMI.    ECOG FS:0 -  Asymptomatic  General: Well-developed, well-nourished, no acute distress. Eyes: Pink conjunctiva, anicteric sclera. Lungs: Clear to auscultation bilaterally. Heart: Regular rate and rhythm. No rubs, murmurs, or gallops. Abdomen: Soft, nontender, nondistended. No organomegaly noted, normoactive bowel sounds. Musculoskeletal: No edema, cyanosis, or clubbing. Neuro: Alert, answering all questions appropriately. Cranial nerves grossly intact. Skin: No rashes or petechiae noted. Psych: Normal affect.   LAB RESULTS:  Lab Results  Component Value Date   NA 139 07/20/2013   K 4.2 07/20/2013   CL 105 07/20/2013   CO2 26 07/20/2013   GLUCOSE 104 (H) 07/20/2013   BUN 11 07/20/2013   CREATININE 0.95 07/20/2013   CALCIUM 9.0 07/20/2013   PROT 7.4 07/19/2013   ALBUMIN 3.7 07/19/2013   AST 29 07/19/2013   ALT 32 07/19/2013   ALKPHOS 65 07/19/2013   BILITOT 1.1 (H) 07/19/2013   GFRNONAA >60 07/20/2013   GFRAA >60 07/20/2013    Lab Results  Component Value Date   WBC 5.4 05/25/2017   NEUTROABS 3.2 05/25/2017   HGB 16.8 05/25/2017   HCT 47.8 05/25/2017   MCV 96.2 05/25/2017   PLT 182 05/25/2017   Lab Results  Component Value Date   FERRITIN 542 (H) 05/25/2017     STUDIES: No results found.  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  PLAN:    1. Hereditary hemochromatosis: Patient's hemoglobin and iron stores have trended up, therefore will continue to require phlebotomy.  Patient has agreed to  return to clinic in 2 weeks to have his phlebotomy done by the [facility].  He will require 400 mL of phlebotomy with goal ferritin between 50 and 100.  Given his rising ferritin, patient may require more frequent phlebotomy.  Return to clinic in 3 months for laboratory work and phlebotomy and then in 6 months for laboratory work and further evaluation.  2. Atrial fibrillation: Continue Xarelto as ordered. 3. Hypertension: Patient's blood pressure  is within normal limits today, continue current medications as prescribed.   [name], MD   08/28/2017 11:49 PM

## 2017-08-30 ENCOUNTER — Other Ambulatory Visit: Payer: Self-pay | Admitting: *Deleted

## 2017-08-31 ENCOUNTER — Inpatient Hospital Stay: Payer: Medicare HMO | Admitting: Oncology

## 2017-08-31 ENCOUNTER — Inpatient Hospital Stay: Payer: Medicare HMO

## 2017-09-29 ENCOUNTER — Telehealth: Payer: Self-pay | Admitting: Cardiovascular Disease

## 2017-09-29 NOTE — Telephone Encounter (Signed)
Refill Request.  

## 2017-09-29 NOTE — Telephone Encounter (Signed)
°*  STAT* If patient is at the pharmacy, call can be transferred to refill team.   1. Which medications need to be refilled? (please list name of each medication and dose if known) Xarelto  20 MG 1 tablet with supper  2. Which pharmacy/location (including street and city if local pharmacy) is medication to be sent to? [facility] on [address]  3. Do they need a 30 day or 90 day supply? 30 day

## 2017-09-30 MED ORDER — RIVAROXABAN 20 MG PO TABS: 20.0000 mg | ORAL_TABLET | Freq: Every day | ORAL | 0 refills | Status: DC

## 2017-09-30 NOTE — Telephone Encounter (Signed)
Patient given application for medication management clinic   He is currently in the donut hole with xarelto and would like to see if he qualifies for patient assistance.

## 2017-09-30 NOTE — Telephone Encounter (Signed)
Patient wants to change to [facility] in [place]    Please call asap as patient is now out of medication .

## 2017-09-30 NOTE — Telephone Encounter (Signed)
Pt last saw Dr Rockey Situ 09/03/16, overdue for follow-up tentative appt made for 10/24/17.  Will need CBC and BMP done at that time too, last labs 11/17/16 Creat 1.0.  Age 57, weight 135.8kg, CrCl 144.03.  Based on CrCl pt is on appropriate dosage of Xarelto 20mg  QD.  Will refill x 1 month only, as pt is overdue for follow-up, and has appt scheduled for 10/24/17.

## 2017-09-30 NOTE — Telephone Encounter (Signed)
This pt is overdue for follow-up with Dr Rockey Situ, before we can refill this rx pt needs to be contacted and an appointment to see Dr Rockey Situ needs to be made.  Pt last saw Dr Rockey Situ 09/03/16.  Please call pt to schedule follow-up appointment and then send refill request back to anticoag clinic and we will give enough refills to get pt to appt scheduled. He will aslo need a CBC and BMP done at that visit last BMP in chart is from 2015.  Mandy put in order for labs which pt never had drawn.  Thanks

## 2017-09-30 NOTE — Telephone Encounter (Signed)
Unable to reach patient . lmov to call office .  Scheduled tentative appt 10/7 with Gollan pending patient confirmation.

## 2017-10-03 NOTE — Telephone Encounter (Signed)
Patient returning call.

## 2017-10-03 NOTE — Telephone Encounter (Signed)
Left voicemail message to call back  

## 2017-10-03 NOTE — Telephone Encounter (Signed)
Spoke with patient and reviewed assistance options for him. Placed a free 30 day supply card up front for him to pick up and advised that at his next appointment we can complete a patient application for assistance if he can bring his tax returns in for that. He verbalized understanding of our conversation, agreement with plan, and had no further questions at this time.

## 2017-10-03 NOTE — Telephone Encounter (Signed)
Patient calling to check on status of patient assistance for xarelto or if there are any other options Please call to discuss

## 2017-10-14 DIAGNOSIS — Z79899 Other long term (current) drug therapy: Secondary | ICD-10-CM | POA: Diagnosis not present

## 2017-10-14 DIAGNOSIS — I480 Paroxysmal atrial fibrillation: Secondary | ICD-10-CM | POA: Diagnosis not present

## 2017-10-14 DIAGNOSIS — E782 Mixed hyperlipidemia: Secondary | ICD-10-CM | POA: Diagnosis not present

## 2017-10-14 DIAGNOSIS — F3341 Major depressive disorder, recurrent, in partial remission: Secondary | ICD-10-CM | POA: Diagnosis not present

## 2017-10-14 DIAGNOSIS — G43709 Chronic migraine without aura, not intractable, without status migrainosus: Secondary | ICD-10-CM | POA: Diagnosis not present

## 2017-10-14 DIAGNOSIS — R7309 Other abnormal glucose: Secondary | ICD-10-CM | POA: Diagnosis not present

## 2017-10-14 DIAGNOSIS — I10 Essential (primary) hypertension: Secondary | ICD-10-CM | POA: Diagnosis not present

## 2017-10-14 DIAGNOSIS — E291 Testicular hypofunction: Secondary | ICD-10-CM | POA: Diagnosis not present

## 2017-10-14 DIAGNOSIS — E349 Endocrine disorder, unspecified: Secondary | ICD-10-CM | POA: Diagnosis not present

## 2017-10-23 NOTE — Progress Notes (Signed)
Patient ID: Collin Holt, male   DOB: 1960/04/08, 57 y.o.   MRN: [id number] Cardiology Office Note  Date:  10/24/2017   ID:  Collin Holt, DOB 09-Aug-1960, MRN [id number]  PCP:  [name], MD   Chief Complaint  Patient presents with  . other    12 month f/u no complaints today. Meds reviewed verbally with pt.    HPI:  Collin Holt is a 57 year old gentleman with  ETOH use Obesity, obstructive sleep apnea, long history of vertigo,  on disability for headaches and severe vertigo,  paroxysmal atrial fibrillation,   in the hospital July 2015 for atrial fibrillation. He was given amiodarone in the hospital and converted to normal sinus rhythm  Prior cardiac catheterization  2012 showing no significant CAD Prior echocardiogram 01/2010 showing normal ejection fraction  sleep apnea, compliant with his CPAP Hemochromatosis, donates a unit every 4 to 6 months He presents today for follow-up of his atrial fibrillation  2 H/A a week Reports that he has injections into his head for symptom relief Does not take his Imitrex on a regular basis only as needed  Trouble affording his Xarelto, now in the donut hole $500 per month Weight continues to trend upwards over the past year he has been compliant with his CPAP Poor diet, drinking soda, high carb foods  Does not appreciate tachycardia concerning for atrial fibrillation Has not had to take amiodarone Takes metoprolol daily  We went through all of his lab work, LFTs elevated Prior alcohol problems, drinking to excess Previous episode after drinking heavy alcohol on Father's Day, that evening had significant atrial fibrillation  EKG personally reviewed by myself on todays visit Shows sinus bradycardia rate 58 bpm no significant ST or T wave changes  Other past medical history diagnosis of hemochromatosis, followed by Dr. Grayland Ormond Also told he has a fatty liver, needs to lose weight  on disability for continued severe vertigo  when he lays down in bed, severe tinnitus Denies any significant shortness of breath or chest pain with exertion   PMH:   has a past medical history of Chronic headaches, Diabetes mellitus without complication ([place]), DOE (dyspnea on exertion), HTN (hypertension), OSA (obstructive sleep apnea), PAF (paroxysmal atrial fibrillation) ([place]), and Sleep apnea.  PSH:    Past Surgical History:  Procedure Laterality Date  . SHOULDER SURGERY     Right x 2    Current Outpatient Medications  Medication Sig Dispense Refill  . amiodarone (PACERONE) 200 MG tablet Take 1 tablet (200 mg total) by mouth daily as needed. 90 tablet 3  . metoprolol succinate (TOPROL-XL) 50 MG 24 hr tablet Take 1 tablet (50 mg total) by mouth daily. Take with or immediately following a meal. 90 tablet 2  . rivaroxaban (XARELTO) 20 MG TABS tablet Take 1 tablet (20 mg total) by mouth daily with supper. Overdue for follow-up, Needs OV for future refills 30 tablet 0  . SUMAtriptan (IMITREX) 100 MG tablet Take 100 mg by mouth as needed.     . venlafaxine (EFFEXOR) 75 MG tablet Take 75 mg by mouth daily.       No current facility-administered medications for this visit.      Allergies:   Morphine and related and Terfenadine   Social History:  The patient  reports that he quit smoking about 18 years ago. His smoking use included cigarettes. He has a 10.00 pack-year smoking history. He quit smokeless tobacco use about 6 years ago. He reports that he  drinks about 4.0 standard drinks of alcohol per week. He reports that he does not use drugs.   Family History:   family history includes Cancer in his father; Emphysema in his father.    Review of Systems: Review of Systems  Constitutional: Negative.   Respiratory: Negative.   Cardiovascular: Negative.   Gastrointestinal: Negative.   Musculoskeletal: Negative.   Neurological: Negative.   Psychiatric/Behavioral: Negative.   All other systems reviewed and are  negative.    PHYSICAL EXAM: VS:  Ht 6' (1.829 m)   Wt 293 lb 8 oz (133.1 kg)   BMI 39.81 kg/m  , BMI Body mass index is 39.81 kg/m. Constitutional:  oriented to person, place, and time. No distress.  HENT:  Head: Normocephalic and atraumatic.  Eyes:  no discharge. No scleral icterus.  Neck: Normal range of motion. Neck supple. No JVD present.  Cardiovascular: Normal rate, regular rhythm, normal heart sounds and intact distal pulses. Exam reveals no gallop and no friction rub. No edema No murmur heard. Pulmonary/Chest: Effort normal and breath sounds normal. No stridor. No respiratory distress.  no wheezes.  no rales.  no tenderness.  Abdominal: Soft.  no distension.  no tenderness.  Musculoskeletal: Normal range of motion.  no  tenderness or deformity.  Neurological:  normal muscle tone. Coordination normal. No atrophy Skin: Skin is warm and dry. No rash noted. not diaphoretic.  Psychiatric:  normal mood and affect. behavior is normal. Thought content normal.   Recent Labs: 05/25/2017: Hemoglobin 16.8; Platelets 182    Lipid Panel Lab Results  Component Value Date   CHOL 195 10/23/2007   HDL 32.4 10/23/2007   LDLCALC 84.2 10/23/2007   TRIG 392 10/23/2007      Wt Readings from Last 3 Encounters:  10/24/17 293 lb 8 oz (133.1 kg)  02/28/17 299 lb 5 oz (135.8 kg)  09/03/16 286 lb 8 oz (130 kg)       ASSESSMENT AND PLAN:  Atrial fibrillation, unspecified type ([place]) - Plan: EKG 12-Lead Recommended he stay on xarelto 20 mg daily metoprolol succinate 50 mg daily We have filled out the forms for Xarelto assistance Samples provided He is in the donut hole Amiodarone as needed for breakthrough palpitations or tachycardia concerning for atrial fibrillation  HYPERTENSION, BENIGN Blood pressure is well controlled on today's visit. No changes made to the medications. Stable  Obstructive sleep apnea Reports he is compliant with his CPAP " Cannot sleep without it"  Morbid  obesity due to excess calories ([facility]) Weight up an additional 10 pounds, recommended he moderate his diet, increase his exercise.  I provided him a dietary guide and went through this with him  Hemachromatosis Periodic blood phlebotomy Has not had follow-up with Dr. Grayland Ormond Discussed with him, appears he has lab work ordered pending for iron studies Reports he has periodic phlebotomy  Encounter for anticoagulation discussion and counseling Tolerating anticoagulation, no bleeding, blood work managed to primary care On Xarelto   Total encounter time more than 25 minutes  Greater than 50% was spent in counseling and coordination of care with the patient   Disposition:   F/U  12 months   Orders Placed This Encounter  Procedures  . EKG 12-Lead     Signed, Esmond Plants, M.D., Ph.D. 10/24/2017  [place], [place]

## 2017-10-24 ENCOUNTER — Encounter: Payer: Self-pay | Admitting: Cardiovascular Disease

## 2017-10-24 ENCOUNTER — Ambulatory Visit: Payer: Medicare HMO | Admitting: Cardiovascular Disease

## 2017-10-24 VITALS — BP 114/74 | HR 58 | Ht 72.0 in | Wt 293.5 lb

## 2017-10-24 DIAGNOSIS — G43009 Migraine without aura, not intractable, without status migrainosus: Secondary | ICD-10-CM

## 2017-10-24 DIAGNOSIS — G4733 Obstructive sleep apnea (adult) (pediatric): Secondary | ICD-10-CM | POA: Diagnosis not present

## 2017-10-24 DIAGNOSIS — I10 Essential (primary) hypertension: Secondary | ICD-10-CM | POA: Diagnosis not present

## 2017-10-24 DIAGNOSIS — I480 Paroxysmal atrial fibrillation: Secondary | ICD-10-CM

## 2017-10-24 MED ORDER — RIVAROXABAN 20 MG PO TABS: 20.0000 mg | ORAL_TABLET | Freq: Every day | ORAL | 3 refills | Status: DC

## 2017-10-24 NOTE — Patient Instructions (Addendum)
Medication Instructions:   No medication changes made Medication Samples have been provided to the patient.  Drug name: Xarelto       Strength: 20 mg        Qty: 4 bottles  LOT: [id number]  Exp.Date: 06/21    Labwork:  No new labs needed  Testing/Procedures:  No further testing at this time   Follow-Up: It was a pleasure seeing you in the office today. Please call us if you have new issues that need to be addressed before your next appt.  [phone]  Your physician wants you to follow-up in: 12 months.  You will receive a reminder letter in the mail two months in advance. If you don't receive a letter, please call our office to schedule the follow-up appointment.  If you need a refill on your cardiac medications before your next appointment, please call your pharmacy.  For educational health videos Log in to : [email or web address] Or : [email or web address], password : [email or web address]

## 2017-10-29 ENCOUNTER — Other Ambulatory Visit: Payer: Self-pay

## 2017-10-29 ENCOUNTER — Encounter: Payer: Self-pay | Admitting: Emergency Medicine

## 2017-10-29 ENCOUNTER — Emergency Department: Payer: Medicare HMO

## 2017-10-29 ENCOUNTER — Emergency Department
Admission: EM | Admit: 2017-10-29 | Discharge: 2017-10-29 | Disposition: A | Discharge status: Home / Self Care | Payer: Medicare HMO | Attending: Emergency Medicine | Admitting: Emergency Medicine

## 2017-10-29 DIAGNOSIS — Z7901 Long term (current) use of anticoagulants: Secondary | ICD-10-CM | POA: Insufficient documentation

## 2017-10-29 DIAGNOSIS — R0602 Shortness of breath: Secondary | ICD-10-CM | POA: Diagnosis not present

## 2017-10-29 DIAGNOSIS — Z87891 Personal history of nicotine dependence: Secondary | ICD-10-CM | POA: Diagnosis not present

## 2017-10-29 DIAGNOSIS — I4891 Unspecified atrial fibrillation: Secondary | ICD-10-CM | POA: Insufficient documentation

## 2017-10-29 DIAGNOSIS — I10 Essential (primary) hypertension: Secondary | ICD-10-CM | POA: Diagnosis not present

## 2017-10-29 DIAGNOSIS — E119 Type 2 diabetes mellitus without complications: Secondary | ICD-10-CM | POA: Diagnosis not present

## 2017-10-29 DIAGNOSIS — R002 Palpitations: Secondary | ICD-10-CM | POA: Diagnosis present

## 2017-10-29 DIAGNOSIS — Z79899 Other long term (current) drug therapy: Secondary | ICD-10-CM | POA: Insufficient documentation

## 2017-10-29 LAB — CBC
HCT: 50 % (ref 39.0–52.0)
Hemoglobin: 17.3 g/dL — ABNORMAL HIGH (ref 13.0–17.0)
MCH: 32.6 pg (ref 26.0–34.0)
MCHC: 34.6 g/dL (ref 30.0–36.0)
MCV: 94.2 fL (ref 80.0–100.0)
Platelets: 206 10*3/uL (ref 150–400)
RBC: 5.31 MIL/uL (ref 4.22–5.81)
RDW: 11.9 % (ref 11.5–15.5)
WBC: 7.1 10*3/uL (ref 4.0–10.5)
nRBC: 0 % (ref 0.0–0.2)

## 2017-10-29 LAB — COMPREHENSIVE METABOLIC PANEL
ALT: 89 U/L — AB (ref 0–44)
AST: 72 U/L — ABNORMAL HIGH (ref 15–41)
Albumin: 4.2 g/dL (ref 3.5–5.0)
Alkaline Phosphatase: 39 U/L (ref 38–126)
Anion gap: 10 (ref 5–15)
BILIRUBIN TOTAL: 1.3 mg/dL — AB (ref 0.3–1.2)
BUN: 18 mg/dL (ref 6–20)
CALCIUM: 9.5 mg/dL (ref 8.9–10.3)
CO2: 23 mmol/L (ref 22–32)
CREATININE: 1.27 mg/dL — AB (ref 0.61–1.24)
Chloride: 107 mmol/L (ref 98–111)
Glucose, Bld: 125 mg/dL — ABNORMAL HIGH (ref 70–99)
Potassium: 4.2 mmol/L (ref 3.5–5.1)
Sodium: 140 mmol/L (ref 135–145)
TOTAL PROTEIN: 7.2 g/dL (ref 6.5–8.1)

## 2017-10-29 LAB — TSH: TSH: 3.537 u[IU]/mL (ref 0.350–4.500)

## 2017-10-29 LAB — TROPONIN I: Troponin I: <0.03 ng/mL (ref <0.03)

## 2017-10-29 NOTE — ED Notes (Signed)
Pt reporting he took 1200 mg of amiodarone and 150 mg of metoprolol at home before coming to ED.

## 2017-10-29 NOTE — ED Triage Notes (Signed)
Palpitations and SOB x 2 days.

## 2017-10-29 NOTE — ED Provider Notes (Addendum)
[facility] Emergency Department Provider Note  ____________________________________________   I have reviewed the triage vital signs and the nursing notes. Where available I have reviewed prior notes and, if possible and indicated, outside hospital notes.    HISTORY  Chief Complaint Palpitations and Shortness of Breath    HPI Collin Holt is a 57 y.o. male with a history of paroxysmal atrial fibrillation for 20 years he states, he is on Xarelto, he also takes a daily beta-blocker.  He has breakthrough amiodarone that he supposed to take when he has atrial fibrillation.  200 mg.  Is written for acute 200 daily but he took 3 yesterday and 2 today.  He states that he also drinks alcohol regularly, he went out and had "quite a few beers with my friend Mikki Santee" on Thursday night, and on Friday he woke up, yesterday, with atrial fibrillation.  He denies any chest pain.  He does not actually have shortness of breath, he just states the palpitations are uncomfortable.  He denies any fever or chills.  Feels worse when he walks around.  Heart rate is not been elevated that he knows about.  But this is a chronic recurrent problem for him.  He is on blood thinners for it.    Past Medical History:  Diagnosis Date  . Chronic headaches   . Diabetes mellitus without complication ([place])   . DOE (dyspnea on exertion)    a. 02/2010 Cath: nl cors;  b. 02/2010 CPX: good fxnl capacity.  Marland Kitchen HTN (hypertension)   . OSA (obstructive sleep apnea)   . PAF (paroxysmal atrial fibrillation) ([place])    a. 2012 Echo: nl LV fxn, mod dil LA.  . Sleep apnea     Patient Active Problem List   Diagnosis Date Noted  . Allergic rhinitis 02/23/2017  . Anxiety 02/23/2017  . Chronic headaches 02/23/2017  . Hypertension 02/23/2017  . Irregular heartbeat 02/23/2017  . 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 Pneumonia 02/23/2017  . Encounter for anticoagulation discussion and counseling 09/02/2016  .  Morbid obesity with BMI of 40.0-44.9, adult ([place]) 08/21/2015  . Fatty infiltration of liver 08/11/2015  . Morbid obesity due to excess calories ([place]) 07/18/2015  . Benign paroxysmal vertigo 03/14/2015  . Noise effect on both inner ears 03/14/2015  . Positional vertigo 03/14/2015  . Hemochromatosis 01/15/2015  . Elevated liver enzymes 07/18/2014  . Testosterone deficiency 07/18/2014  . Dizziness 05/25/2011  . Depression 04/13/2011  . Migraine with vertigo 04/13/2011  . OSA (obstructive sleep apnea) 07/31/2010  . Headache, common migraine 05/01/2010  . SINUSITIS - ACUTE-NOS 04/04/2010  . CHEST PAIN-UNSPECIFIED 01/16/2010  . CHEST PAIN-PRECORDIAL 01/16/2010  . DYSPNEA ON EXERTION 01/05/2010  . HYPERTENSION, BENIGN 07/27/2008  . Paroxysmal atrial fibrillation ([place]) 07/27/2008  . PALPITATIONS 07/27/2008    Past Surgical History:  Procedure Laterality Date  . SHOULDER SURGERY     Right x 2    Prior to Admission medications   Medication Sig Start Date End Date Taking? Authorizing Provider  amiodarone (PACERONE) 200 MG tablet Take 1 tablet (200 mg total) by mouth daily as needed. 09/03/16   [name], MD  metoprolol succinate (TOPROL-XL) 50 MG 24 hr tablet Take 1 tablet (50 mg total) by mouth daily. Take with or immediately following a meal. 03/29/17   Gollan, Kathlene November, MD  rivaroxaban (XARELTO) 20 MG TABS tablet Take 1 tablet (20 mg total) by mouth daily with supper. Overdue for follow-up, Needs OV for future  refills 09/30/17   [name], MD  rivaroxaban (XARELTO) 20 MG TABS tablet Take 1 tablet (20 mg total) by mouth daily with supper. 10/24/17   [name], MD  SUMAtriptan (IMITREX) 100 MG tablet Take 100 mg by mouth as needed.  05/05/12   [provider]  venlafaxine (EFFEXOR) 75 MG tablet Take 75 mg by mouth daily.      [provider]    Allergies Morphine and related and Terfenadine  Family History  Problem Relation Age of Onset  .  Emphysema Father   . Cancer Father   . Prostate cancer Neg Hx   . Colon cancer Neg Hx   . Coronary artery disease Neg Hx   . Diabetes Neg Hx     Social History Social History   Tobacco Use  . Smoking status: Former Smoker    Packs/day: 1.00    Years: 10.00    Pack years: 10.00    Types: Cigarettes    Last attempt to quit: 01/19/1999    Years since quitting: 18.7  . Smokeless tobacco: Former Systems developer    Quit date: 01/19/2011  Substance Use Topics  . Alcohol use: Yes    Alcohol/week: 4.0 standard drinks    Types: 4 Glasses of wine per week  . Drug use: No    Review of Systems Constitutional: No fever/chills Eyes: No visual changes. ENT: No sore throat. No stiff neck no neck pain Cardiovascular: Denies chest pain. Respiratory: Denies shortness of breath. Gastrointestinal:   no vomiting.  No diarrhea.  No constipation. Genitourinary: Negative for dysuria. Musculoskeletal: Negative lower extremity swelling Skin: Negative for rash. Neurological: Negative for severe headaches, focal weakness or numbness.   ____________________________________________   PHYSICAL EXAM:  VITAL SIGNS: ED Triage Vitals  Enc Vitals Group     BP 10/29/17 1311 (!) 129/102     Pulse Rate 10/29/17 1311 64     Resp 10/29/17 1311 18     Temp 10/29/17 1311 (!) 97.3 F (36.3 C)     Temp Source 10/29/17 1311 Oral     SpO2 10/29/17 1311 97 %     Weight 10/29/17 1308 292 lb (132.5 kg)     Height 10/29/17 1308 6' (1.829 m)     Head Circumference --      Peak Flow --      Pain Score 10/29/17 1308 4     Pain Loc --      Pain Edu? --      Excl. in [place]? --     Constitutional: Alert and oriented. Well appearing and in no acute distress. Eyes: Conjunctivae are normal Head: Atraumatic HEENT: No congestion/rhinnorhea. Mucous membranes are moist.  Oropharynx non-erythematous Neck:   Nontender with no meningismus, no masses, no stridor Cardiovascular: Irregularly irregular. Grossly normal heart sounds.   Good peripheral circulation. Respiratory: Normal respiratory effort.  No retractions. Lungs CTAB. Abdominal: Soft and nontender. No distention. No guarding no rebound Back:  There is no focal tenderness or step off.  there is no midline tenderness there are no lesions noted. there is no CVA tenderness Musculoskeletal: No lower extremity tenderness, no upper extremity tenderness. No joint effusions, no DVT signs strong distal pulses no edema Neurologic:  Normal speech and language. No gross focal neurologic deficits are appreciated.  Skin:  Skin is warm, dry and intact. No rash noted. Psychiatric: Mood and affect are normal. Speech and behavior are normal.  ____________________________________________   LABS (all labs ordered are listed, but  only abnormal results are displayed)  Labs Reviewed  CBC - Abnormal; Notable for the following components:      Result Value   Hemoglobin 17.3 (*)    All other components within normal limits  COMPREHENSIVE METABOLIC PANEL - Abnormal; Notable for the following components:   Glucose, Bld 125 (*)    Creatinine, Ser 1.27 (*)    AST 72 (*)    ALT 89 (*)    Total Bilirubin 1.3 (*)    All other components within normal limits  TROPONIN I  TSH    Pertinent labs  results that were available during my care of the patient were reviewed by me and considered in my medical decision making (see chart for details). ____________________________________________  EKG  I personally interpreted any EKGs ordered by me or triage Atrial fibrillation rate 92 bpm, no acute ST elevation or depression, LVH noted, no acute ischemic changes.  Early repolarization abnormality ____________________________________________  RADIOLOGY  Pertinent labs & imaging results that were available during my care of the patient were reviewed by me and considered in my medical decision making (see chart for details). If possible, patient and/or family made aware of any abnormal  findings.  Dg Chest 2 View  Result Date: 10/29/2017 CLINICAL DATA:  Palpitations and SOB x2 days. Patient states hx of pneumonia, HTN, PAF, former smoker. EXAM: CHEST - 2 VIEW COMPARISON:  Chest x-ray dated 05/07/2007. FINDINGS: Heart size and mediastinal contours are within normal limits. Lungs are clear. No pleural effusion or pneumothorax seen. Osseous structures about the chest are unremarkable. IMPRESSION: No active cardiopulmonary disease. Electronically Signed   By: Franki Cabot M.D.   On: 10/29/2017 14:47   ____________________________________________    PROCEDURES  Procedure(s) performed: None  Procedures  Critical Care performed: None  ____________________________________________   INITIAL IMPRESSION / ASSESSMENT AND PLAN / ED COURSE  Pertinent labs & imaging results that were available during my care of the patient were reviewed by me and considered in my medical decision making (see chart for details).  With paroxysmal atrial fibrillation, having paroxysmal atrial fibrillation he is already anticoagulated, he is rate controlled, he is not having active chest pain, he denies shortness of breath, chest x-ray is reassuring and possibly some component of holiday heart with this as it does sometimes happen after drinking alcohol which have counseled him extensively about.  Patient is in no acute distress.  I have also counseled him about taking his medications as prescribed.  He is also taken a total of 1 g of amiodarone and 150 mg of metoprolol since this started yesterday.  Pressures are good however.  We will talk to cardiology about what else they want to do.  ----------------------------------------- 6:12 PM on 10/29/2017 -----------------------------------------  Patient and I both awaiting callback from Dr. Granville Lewis cardiology team.  Has been over an hour.  ----------------------------------------- 6:48 PM on  10/29/2017 -----------------------------------------  Had the pleasure of discussing this patient with Dr. Fletcher Anon, he and I discussed the patient's anticoagulation, lack of significant symptoms, blood work work-up etc.  Patient is eager to go home and Dr. Fletcher Anon feels this is the best thing for him.  He advises the patient take his amiodarone twice a day instead of the 3 times a day he has been trying, and continue on his beta-blocker, they will see him in the office on Monday.  If the patient has any new or worrisome symptoms he is instructed to return and he understands what those are.  Customary discussion  had with the patient.  His daughter is an ICU nurse, she and I also discussed the care she is very comfortable with this disposition.   ____________________________________________   FINAL CLINICAL IMPRESSION(S) / ED DIAGNOSES  Final diagnoses:  None      This chart was dictated using voice recognition software.  Despite best efforts to proofread,  errors can occur which can change meaning.      Schuyler Amor, MD 10/29/17 1710    Schuyler Amor, MD 10/29/17 1813    Schuyler Amor, MD 10/29/17 [phone]

## 2017-10-29 NOTE — Discharge Instructions (Addendum)
Return to the emergency room for any new or worrisome symptoms.  Take the amiodarone twice a day and continue taking Toprol as described.  You may be in atrial fibrillation for some days.  That is okay.  If you have chest pain shortness of breath or other concerns, return to the emergency room.  This will include numbness or weakness in the one part of your body difficulty speaking or walking.  We do not anticipate this likely happening.  However, it is important to be aware.  Please follow closely with audiologist on Monday.

## 2017-10-31 ENCOUNTER — Telehealth: Payer: Self-pay | Admitting: Cardiovascular Disease

## 2017-10-31 NOTE — Telephone Encounter (Signed)
Patient was in ED on 10/29/17 but was seen in office 10/24/17 Please call and see if patient needs FU appointment with provider

## 2017-10-31 NOTE — Telephone Encounter (Signed)
Pt is returning you call .

## 2017-10-31 NOTE — Telephone Encounter (Signed)
No answer. Left message to call back.   

## 2017-11-01 NOTE — Telephone Encounter (Signed)
Spoke with patient and reviewed that the packet of information he brought in was to be turned into medication management program. Since we do need some of the paperwork for his xarelto assistance then I will call him once I have that done so he can pick up to take over to the medication assistance office. He verbalized understanding with no further questions at this time.

## 2017-11-01 NOTE — Telephone Encounter (Signed)
Patient returning call to [facility] re: fu

## 2017-11-02 NOTE — Telephone Encounter (Signed)
Spoke with patient and reviewed that assistance application has been faxed with documentation needed. Requested that he please come pick up all tax documents and medication management application with instructions to please call and schedule appointment to review. He verbalized understanding with no further questions.

## 2017-12-02 NOTE — Telephone Encounter (Signed)
Spoke with patient and reviewed that other options would require routine testing and adjustments. He verbalized understanding but states that he did not qualify for any help. Advised that I would check with provider on other options and will be in touch with him first of the week. He verbalized understanding with no further questions at this time.

## 2017-12-02 NOTE — Telephone Encounter (Signed)
Left voicemail message to call back  

## 2017-12-02 NOTE — Telephone Encounter (Signed)
Patient calling  He is getting low on his Xarelto but was denied for assistance Would like to know if there is another medication option Please call to discuss

## 2017-12-02 NOTE — Telephone Encounter (Signed)
Pt is returning your call

## 2017-12-04 NOTE — Telephone Encounter (Signed)
Does need help with just the donut hole Next 6 weeks? If we are unable to provide samples perhaps we could change to eliquis 5 twice daily with a 9-month free coupon,  2 weeks of samples Starting next year potentially could change back to Xarelto or stay on Eliquis Should be no donut hole next year

## 2017-12-05 NOTE — Telephone Encounter (Signed)
Spoke with patient and reviewed that samples are limited but to please call us when he is nearly out. He verbalized understanding, was appreciative for the call, and had no further questions at this time.   Medication Samples have been provided to the patient.  Drug name: Xarelto       Strength: 20 mg        Qty: 4 bottles  LOT: [id number]  Exp.Date: 03/21  Placed up front for patient to pick up.

## 2017-12-07 ENCOUNTER — Other Ambulatory Visit: Payer: Self-pay | Admitting: Cardiovascular Disease

## 2017-12-30 ENCOUNTER — Inpatient Hospital Stay: Payer: Medicare HMO | Attending: Oncology | Admitting: Oncology

## 2017-12-30 ENCOUNTER — Inpatient Hospital Stay: Payer: Medicare HMO

## 2017-12-30 DIAGNOSIS — E119 Type 2 diabetes mellitus without complications: Secondary | ICD-10-CM

## 2017-12-30 DIAGNOSIS — I480 Paroxysmal atrial fibrillation: Secondary | ICD-10-CM | POA: Diagnosis not present

## 2017-12-30 DIAGNOSIS — Z7901 Long term (current) use of anticoagulants: Secondary | ICD-10-CM | POA: Insufficient documentation

## 2017-12-30 DIAGNOSIS — I10 Essential (primary) hypertension: Secondary | ICD-10-CM | POA: Insufficient documentation

## 2017-12-30 LAB — IRON AND TIBC
Iron: 130 ug/dL (ref 45–182)
Saturation Ratios: 41 % — ABNORMAL HIGH (ref 17.9–39.5)
TIBC: 321 ug/dL (ref 250–450)
UIBC: 191 ug/dL

## 2017-12-30 LAB — CBC WITH DIFFERENTIAL/PLATELET
Abs Immature Granulocytes: 0.02 10*3/uL (ref 0.00–0.07)
BASOS ABS: 0 10*3/uL (ref 0.0–0.1)
BASOS PCT: 1 %
EOS ABS: 0.2 10*3/uL (ref 0.0–0.5)
EOS PCT: 4 %
HCT: 45.8 % (ref 39.0–52.0)
Hemoglobin: 15.8 g/dL (ref 13.0–17.0)
IMMATURE GRANULOCYTES: 0 %
Lymphocytes Relative: 26 %
Lymphs Abs: 1.3 10*3/uL (ref 0.7–4.0)
MCH: 33 pg (ref 26.0–34.0)
MCHC: 34.5 g/dL (ref 30.0–36.0)
MCV: 95.6 fL (ref 80.0–100.0)
Monocytes Absolute: 0.3 10*3/uL (ref 0.1–1.0)
Monocytes Relative: 5 %
NEUTROS PCT: 64 %
NRBC: 0 % (ref 0.0–0.2)
Neutro Abs: 3.2 10*3/uL (ref 1.7–7.7)
Platelets: 177 10*3/uL (ref 150–400)
RBC: 4.79 MIL/uL (ref 4.22–5.81)
RDW: 11.9 % (ref 11.5–15.5)
WBC: 5 10*3/uL (ref 4.0–10.5)

## 2017-12-30 LAB — FERRITIN: FERRITIN: 634 ng/mL — AB (ref 24–336)

## 2017-12-31 NOTE — Progress Notes (Signed)
Collin Holt  Telephone:([phone]) [phone] [phone]) [phone]  ID: [name] OB: 1960/08/12  MR#: [id number]  VFI#:[id number]  Patient Care Team: [name], MD as PCP - General (Internal Medicine) [name], MD as Consulting Physician (Cardiology)  CHIEF COMPLAINT:  Hereditary hemochromatosis   INTERVAL HISTORY: Patient returns to clinic today for repeat laboratory work, further evaluation, and consideration of additional phlebotomy.  He missed his most recent phlebotomy in August 2019.  He continues to feel well and remains asymptomatic.  He has noted no dizziness or headaches. He has no neurologic complaints. He does not complain of weakness or fatigue. He denies any recent fevers or illnesses. He has a good appetite and denies weight loss. He has no chest pain or shortness of breath. He denies any nausea, vomiting, constipation, or diarrhea. He has no urinary complaints.  Patient feels at his baseline offers no specific complaints today.  REVIEW OF SYSTEMS:   Review of Systems  Constitutional: Negative.  Negative for fever, malaise/fatigue and weight loss.  Respiratory: Negative.  Negative for cough and shortness of breath.   Cardiovascular: Negative.  Negative for chest pain and leg swelling.  Gastrointestinal: Negative.  Negative for abdominal pain, blood in stool and melena.  Genitourinary: Negative.  Negative for dysuria.  Musculoskeletal: Negative.  Negative for back pain and myalgias.  Skin: Negative.  Negative for rash.  Neurological: Negative.  Negative for dizziness, weakness and headaches.  Psychiatric/Behavioral: Negative.  The patient is not nervous/anxious.     As per HPI. Otherwise, a complete review of systems is negative.  PAST MEDICAL HISTORY: Past Medical History:  Diagnosis Date  . Chronic headaches   . Diabetes mellitus without complication ([place])   . DOE (dyspnea on exertion)    a. 02/2010 Cath: nl cors;  b. 02/2010 CPX: good  fxnl capacity.  Marland Kitchen HTN (hypertension)   . OSA (obstructive sleep apnea)   . PAF (paroxysmal atrial fibrillation) ([place])    a. 2012 Echo: nl LV fxn, mod dil LA.  . Sleep apnea     PAST SURGICAL HISTORY: Past Surgical History:  Procedure Laterality Date  . SHOULDER SURGERY     Right x 2    FAMILY HISTORY Family History  Problem Relation Age of Onset  . Emphysema Father   . Cancer Father   . Prostate cancer Neg Hx   . Colon cancer Neg Hx   . Coronary artery disease Neg Hx   . Diabetes Neg Hx        ADVANCED DIRECTIVES:    HEALTH MAINTENANCE: Social History   Tobacco Use  . Smoking status: Former Smoker    Packs/day: 1.00    Years: 10.00    Pack years: 10.00    Types: Cigarettes    Last attempt to quit: 01/19/1999    Years since quitting: 18.9  . Smokeless tobacco: Former Systems developer    Quit date: 01/19/2011  Substance Use Topics  . Alcohol use: Yes    Alcohol/week: 4.0 standard drinks    Types: 4 Glasses of wine per week  . Drug use: No     Allergies  Allergen Reactions  . Morphine And Related Nausea And Vomiting  . Terfenadine Other (See Comments)    Irregular heartbeat    Current Outpatient Medications  Medication Sig Dispense Refill  . amiodarone (PACERONE) 200 MG tablet Take 1 tablet (200 mg total) by mouth daily as needed. 90 tablet 3  . metoprolol succinate (TOPROL-XL) 50 MG 24 hr  tablet Take 1 tablet (50 mg total) by mouth daily. Take with or immediately following a meal. 90 tablet 2  . metoprolol succinate (TOPROL-XL) 50 MG 24 hr tablet TAKE 1 TABLET BY MOUTH ONCE DAILY *TAKE  WITH  OR  IMMEDIATELY  FOLLOWING  A  MEAL* 90 tablet 1  . rivaroxaban (XARELTO) 20 MG TABS tablet Take 1 tablet (20 mg total) by mouth daily with supper. Overdue for follow-up, Needs OV for future refills 30 tablet 0  . rivaroxaban (XARELTO) 20 MG TABS tablet Take 1 tablet (20 mg total) by mouth daily with supper. 90 tablet 3  . SUMAtriptan (IMITREX) 100 MG tablet Take 100 mg by mouth as  needed.     . venlafaxine (EFFEXOR) 75 MG tablet Take 75 mg by mouth daily.       No current facility-administered medications for this visit.     OBJECTIVE: There were no vitals filed for this visit.   There is no height or weight on file to calculate BMI.    ECOG FS:0 - Asymptomatic  General: Well-developed, well-nourished, no acute distress. Eyes: Pink conjunctiva, anicteric sclera. HEENT: Normocephalic, moist mucous membranes, clear oropharnyx. Lungs: Clear to auscultation bilaterally. Heart: Regular rate and rhythm. No rubs, murmurs, or gallops. Abdomen: Soft, nontender, nondistended. No organomegaly noted, normoactive bowel sounds. Musculoskeletal: No edema, cyanosis, or clubbing. Neuro: Alert, answering all questions appropriately. Cranial nerves grossly intact. Skin: No rashes or petechiae noted. Psych: Normal affect. Lymphatics: No cervical, calvicular, axillary or inguinal LAD.  LAB RESULTS:  Lab Results  Component Value Date   NA 140 10/29/2017   K 4.2 10/29/2017   CL 107 10/29/2017   CO2 23 10/29/2017   GLUCOSE 125 (H) 10/29/2017   BUN 18 10/29/2017   CREATININE 1.27 (H) 10/29/2017   CALCIUM 9.5 10/29/2017   PROT 7.2 10/29/2017   ALBUMIN 4.2 10/29/2017   AST 72 (H) 10/29/2017   ALT 89 (H) 10/29/2017   ALKPHOS 39 10/29/2017   BILITOT 1.3 (H) 10/29/2017   GFRNONAA >60 10/29/2017   GFRAA >60 10/29/2017    Lab Results  Component Value Date   WBC 5.0 12/30/2017   NEUTROABS 3.2 12/30/2017   HGB 15.8 12/30/2017   HCT 45.8 12/30/2017   MCV 95.6 12/30/2017   PLT 177 12/30/2017   Lab Results  Component Value Date   FERRITIN 634 (H) 12/30/2017     STUDIES: No results found.  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  PLAN:    1. Hereditary hemochromatosis: Patient's hemoglobin continues to be within normal limits, although his ferritin levels have significantly increased to 634.  Patient has not had  phlebotomy in greater than 6 months.  Goal ferritin will be between 50 and 100.  Proceed with phlebotomy today.  Return to clinic in 3 months for laboratory work and phlebotomy only and then in 6 months for further evaluation.  If patient's ferritin does not decrease, will increase the frequency of phlebotomy at that time.   2. Atrial fibrillation: Continue Xarelto as ordered. 3. Hypertension: Patient's blood pressure is within normal limits today, continue current medications as prescribed.  I spent a total of 30 minutes face-to-face with the patient of which greater than 50% of the visit was spent in counseling and coordination of care as detailed above.  Patient expressed understanding that he can call or return to clinic at any time if he has any questions, concerns, or complaints.  [name], MD   12/31/2017  8:45 AM

## 2018-01-24 ENCOUNTER — Other Ambulatory Visit: Payer: Self-pay | Admitting: Cardiovascular Disease

## 2018-01-24 NOTE — Telephone Encounter (Signed)
Please review for refill. Thank you! 

## 2018-03-29 ENCOUNTER — Other Ambulatory Visit: Payer: Self-pay | Admitting: *Deleted

## 2018-03-31 ENCOUNTER — Inpatient Hospital Stay: Payer: Medicare HMO

## 2018-04-03 ENCOUNTER — Inpatient Hospital Stay: Payer: Medicare HMO | Attending: Oncology

## 2018-04-03 ENCOUNTER — Other Ambulatory Visit: Payer: Self-pay

## 2018-04-03 ENCOUNTER — Inpatient Hospital Stay: Payer: Medicare HMO

## 2018-04-03 LAB — CBC WITH DIFFERENTIAL/PLATELET
Abs Immature Granulocytes: 0.03 10*3/uL (ref 0.00–0.07)
BASOS ABS: 0.1 10*3/uL (ref 0.0–0.1)
Basophils Relative: 1 %
Eosinophils Absolute: 0.2 10*3/uL (ref 0.0–0.5)
Eosinophils Relative: 3 %
HEMATOCRIT: 45.2 % (ref 39.0–52.0)
HEMOGLOBIN: 15.5 g/dL (ref 13.0–17.0)
IMMATURE GRANULOCYTES: 1 %
LYMPHS ABS: 1.9 10*3/uL (ref 0.7–4.0)
LYMPHS PCT: 32 %
MCH: 32.6 pg (ref 26.0–34.0)
MCHC: 34.3 g/dL (ref 30.0–36.0)
MCV: 95.2 fL (ref 80.0–100.0)
Monocytes Absolute: 0.7 10*3/uL (ref 0.1–1.0)
Monocytes Relative: 12 %
NEUTROS ABS: 3.1 10*3/uL (ref 1.7–7.7)
NEUTROS PCT: 51 %
NRBC: 0 % (ref 0.0–0.2)
Platelets: 188 10*3/uL (ref 150–400)
RBC: 4.75 MIL/uL (ref 4.22–5.81)
RDW: 11.8 % (ref 11.5–15.5)
WBC: 6 10*3/uL (ref 4.0–10.5)

## 2018-04-03 LAB — IRON AND TIBC
Iron: 113 ug/dL (ref 45–182)
Saturation Ratios: 34 % (ref 17.9–39.5)
TIBC: 334 ug/dL (ref 250–450)
UIBC: 221 ug/dL

## 2018-04-03 LAB — FERRITIN: Ferritin: 434 ng/mL — ABNORMAL HIGH (ref 24–336)

## 2018-06-09 ENCOUNTER — Other Ambulatory Visit: Payer: Self-pay

## 2018-06-09 ENCOUNTER — Other Ambulatory Visit: Payer: Self-pay | Admitting: Cardiovascular Disease

## 2018-06-09 MED ORDER — METOPROLOL SUCCINATE ER 50 MG PO TB24: 50.0000 mg | ORAL_TABLET | Freq: Every day | ORAL | 2 refills | Status: DC

## 2018-06-09 NOTE — Telephone Encounter (Signed)
*  STAT* If patient is at the pharmacy, call can be transferred to refill team.   1. Which medications need to be refilled? (please list name of each medication and dose if known) METOPROLOL  2. Which pharmacy/location (including street and city if local pharmacy) is medication to be sent to? [place] 3. Do they need a 30 day or 90 day supply? [place]

## 2018-06-25 NOTE — Progress Notes (Signed)
Collin Holt  Telephone:(336) [phone] Fax:(336) [phone]  ID: [name] OB: 15-Dec-1960  MR#: [id number]  QPR#:[id number]  Patient Care Team: [name], MD as PCP - General (Internal Medicine) [name], MD as Consulting Physician (Cardiology)  CHIEF COMPLAINT:  Hereditary hemochromatosis   INTERVAL HISTORY: Patient returns to clinic today for repeat laboratory work, further evaluation, and continuation of phlebotomy.  He continues to feel well and remains asymptomatic.  He has no neurologic complaints. He does not complain of weakness or fatigue. He denies any recent fevers or illnesses. He has a good appetite and denies weight loss.  He denies any chest pain, shortness of breath, cough, or hemoptysis.  He denies any nausea, vomiting, constipation, or diarrhea. He has no urinary complaints.  Patient feels at his baseline offers no specific complaints today.  REVIEW OF SYSTEMS:   Review of Systems  Constitutional: Negative.  Negative for fever, malaise/fatigue and weight loss.  Respiratory: Negative.  Negative for cough and shortness of breath.   Cardiovascular: Negative.  Negative for chest pain and leg swelling.  Gastrointestinal: Negative.  Negative for abdominal pain, blood in stool and melena.  Genitourinary: Negative.  Negative for dysuria.  Musculoskeletal: Negative.  Negative for back pain and myalgias.  Skin: Negative.  Negative for rash.  Neurological: Negative.  Negative for dizziness, weakness and headaches.  Psychiatric/Behavioral: Negative.  The patient is not nervous/anxious.     As per HPI. Otherwise, a complete review of systems is negative.  PAST MEDICAL HISTORY: Past Medical History:  Diagnosis Date  . Chronic headaches   . Diabetes mellitus without complication ([place])   . DOE (dyspnea on exertion)    a. 02/2010 Cath: nl cors;  b. 02/2010 CPX: good fxnl capacity.  Marland Kitchen HTN (hypertension)   . OSA (obstructive sleep apnea)   . PAF  (paroxysmal atrial fibrillation) ([place])    a. 2012 Echo: nl LV fxn, mod dil LA.  . Sleep apnea     PAST SURGICAL HISTORY: Past Surgical History:  Procedure Laterality Date  . SHOULDER SURGERY     Right x 2    FAMILY HISTORY Family History  Problem Relation Age of Onset  . Emphysema Father   . Cancer Father   . Prostate cancer Neg Hx   . Colon cancer Neg Hx   . Coronary artery disease Neg Hx   . Diabetes Neg Hx        ADVANCED DIRECTIVES:    HEALTH MAINTENANCE: Social History   Tobacco Use  . Smoking status: Former Smoker    Packs/day: 1.00    Years: 10.00    Pack years: 10.00    Types: Cigarettes    Quit date: 01/19/1999    Years since quitting: 19.4  . Smokeless tobacco: Former Systems developer    Quit date: 01/19/2011  Substance Use Topics  . Alcohol use: Yes    Alcohol/week: 4.0 standard drinks    Types: 4 Glasses of wine per week  . Drug use: No     Allergies  Allergen Reactions  . Morphine And Related Nausea And Vomiting  . Terfenadine Other (See Comments)    Irregular heartbeat Other reaction(s): GI Upset (intolerance)    Current Outpatient Medications  Medication Sig Dispense Refill  . amiodarone (PACERONE) 200 MG tablet Take 1 tablet (200 mg total) by mouth daily as needed. 90 tablet 3  . metoprolol succinate (TOPROL-XL) 50 MG 24 hr tablet TAKE 1 TABLET BY MOUTH ONCE DAILY *TAKE  WITH  OR  IMMEDIATELY  FOLLOWING  A  MEAL* 90 tablet 2  . metoprolol succinate (TOPROL-XL) 50 MG 24 hr tablet Take 1 tablet (50 mg total) by mouth daily. Take with or immediately following a meal. 90 tablet 2  . rivaroxaban (XARELTO) 20 MG TABS tablet Take 1 tablet (20 mg total) by mouth daily with supper. 90 tablet 3  . SUMAtriptan (IMITREX) 100 MG tablet Take 100 mg by mouth as needed.     . venlafaxine (EFFEXOR) 75 MG tablet Take 75 mg by mouth daily.      Alveda Reasons 20 MG TABS tablet take one tablet by mouth daily with supper. Overdue to follow-up, needs OV for future refills 30  tablet 6   No current facility-administered medications for this visit.     OBJECTIVE: Vitals:   06/30/18 1307  BP: 119/74  Pulse: (!) 53  Temp: 98.7 F (37.1 C)     Body mass index is 39.86 kg/m.    ECOG FS:0 - Asymptomatic  General: Well-developed, well-nourished, no acute distress. Eyes: Pink conjunctiva, anicteric sclera. HEENT: Normocephalic, moist mucous membranes. Lungs: Clear to auscultation bilaterally. Heart: Regular rate and rhythm. No rubs, murmurs, or gallops. Abdomen: Soft, nontender, nondistended. No organomegaly noted, normoactive bowel sounds. Musculoskeletal: No edema, cyanosis, or clubbing. Neuro: Alert, answering all questions appropriately. Cranial nerves grossly intact. Skin: No rashes or petechiae noted. Psych: Normal affect.  LAB RESULTS:  Lab Results  Component Value Date   NA 140 10/29/2017   K 4.2 10/29/2017   CL 107 10/29/2017   CO2 23 10/29/2017   GLUCOSE 125 (H) 10/29/2017   BUN 18 10/29/2017   CREATININE 1.27 (H) 10/29/2017   CALCIUM 9.5 10/29/2017   PROT 7.2 10/29/2017   ALBUMIN 4.2 10/29/2017   AST 72 (H) 10/29/2017   ALT 89 (H) 10/29/2017   ALKPHOS 39 10/29/2017   BILITOT 1.3 (H) 10/29/2017   GFRNONAA >60 10/29/2017   GFRAA >60 10/29/2017    Lab Results  Component Value Date   WBC 6.3 06/30/2018   NEUTROABS 3.7 06/30/2018   HGB 15.6 06/30/2018   HCT 45.1 06/30/2018   MCV 94.5 06/30/2018   PLT 178 06/30/2018   Lab Results  Component Value Date   FERRITIN 559 (H) 06/30/2018     STUDIES: No results found.  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  PLAN:    1. Hereditary hemochromatosis: Patient's hemoglobin continues to be within normal limits.  His ferritin levels remain significantly increased and are 559 today.  Goal ferritin is 50-100.  Proceed with 500 mL phlebotomy today.  Return to clinic in 2 and 4 months for laboratory work and phlebotomy.  Patient will then return  to clinic in 6 months for further evaluation and continuation of treatment.   2. Atrial fibrillation: Continue Xarelto as ordered. 3. Hypertension: Patient's blood pressure continues to be within normal limits.   I spent a total of 30 minutes face-to-face with the patient of which greater than 50% of the visit was spent in counseling and coordination of care as detailed above.   Patient expressed understanding that he can call or return to clinic at any time if he has any questions, concerns, or complaints.  [name], MD   07/01/2018 9:10 AM

## 2018-06-30 ENCOUNTER — Inpatient Hospital Stay: Payer: Medicare HMO

## 2018-06-30 ENCOUNTER — Encounter: Payer: Self-pay | Admitting: Oncology

## 2018-06-30 ENCOUNTER — Other Ambulatory Visit: Payer: Self-pay

## 2018-06-30 ENCOUNTER — Inpatient Hospital Stay: Payer: Medicare HMO | Attending: Oncology | Admitting: Oncology

## 2018-06-30 DIAGNOSIS — Z7901 Long term (current) use of anticoagulants: Secondary | ICD-10-CM | POA: Insufficient documentation

## 2018-06-30 DIAGNOSIS — I4891 Unspecified atrial fibrillation: Secondary | ICD-10-CM | POA: Insufficient documentation

## 2018-06-30 DIAGNOSIS — I10 Essential (primary) hypertension: Secondary | ICD-10-CM | POA: Diagnosis not present

## 2018-06-30 LAB — CBC WITH DIFFERENTIAL/PLATELET
Abs Immature Granulocytes: 0.02 10*3/uL (ref 0.00–0.07)
Basophils Absolute: 0.1 10*3/uL (ref 0.0–0.1)
Basophils Relative: 1 %
Eosinophils Absolute: 0.3 10*3/uL (ref 0.0–0.5)
Eosinophils Relative: 4 %
HCT: 45.1 % (ref 39.0–52.0)
Hemoglobin: 15.6 g/dL (ref 13.0–17.0)
Immature Granulocytes: 0 %
Lymphocytes Relative: 28 %
Lymphs Abs: 1.8 10*3/uL (ref 0.7–4.0)
MCH: 32.7 pg (ref 26.0–34.0)
MCHC: 34.6 g/dL (ref 30.0–36.0)
MCV: 94.5 fL (ref 80.0–100.0)
Monocytes Absolute: 0.5 10*3/uL (ref 0.1–1.0)
Monocytes Relative: 9 %
Neutro Abs: 3.7 10*3/uL (ref 1.7–7.7)
Neutrophils Relative %: 58 %
Platelets: 178 10*3/uL (ref 150–400)
RBC: 4.77 MIL/uL (ref 4.22–5.81)
RDW: 11.8 % (ref 11.5–15.5)
WBC: 6.3 10*3/uL (ref 4.0–10.5)
nRBC: 0 % (ref 0.0–0.2)

## 2018-06-30 LAB — IRON AND TIBC
Iron: 246 ug/dL — ABNORMAL HIGH (ref 45–182)
Saturation Ratios: 77 % — ABNORMAL HIGH (ref 17.9–39.5)
TIBC: 321 ug/dL (ref 250–450)
UIBC: 75 ug/dL

## 2018-06-30 LAB — FERRITIN: Ferritin: 559 ng/mL — ABNORMAL HIGH (ref 24–336)

## 2018-06-30 NOTE — Progress Notes (Signed)
Patient stated that he had been doing well with no complaints. 

## 2018-06-30 NOTE — Progress Notes (Signed)
Per Herb Grays, CMA- Dr. Grayland Ormond states ok to proceed with 500cc of therapeutic phlebotomy in clinic today before ferritin level results.

## 2018-06-30 NOTE — Progress Notes (Signed)
500cc of blood removed from pt through therapeutic phlebotomy. Pt tolerated well.

## 2018-08-28 ENCOUNTER — Other Ambulatory Visit: Payer: Self-pay

## 2018-08-29 ENCOUNTER — Other Ambulatory Visit: Payer: Self-pay

## 2018-08-29 ENCOUNTER — Inpatient Hospital Stay: Payer: Medicare HMO | Attending: Oncology

## 2018-09-15 DIAGNOSIS — E782 Mixed hyperlipidemia: Secondary | ICD-10-CM | POA: Diagnosis not present

## 2018-09-15 DIAGNOSIS — R7309 Other abnormal glucose: Secondary | ICD-10-CM | POA: Diagnosis not present

## 2018-09-15 DIAGNOSIS — I10 Essential (primary) hypertension: Secondary | ICD-10-CM | POA: Diagnosis not present

## 2018-09-15 DIAGNOSIS — F3341 Major depressive disorder, recurrent, in partial remission: Secondary | ICD-10-CM | POA: Diagnosis not present

## 2018-09-15 DIAGNOSIS — G43709 Chronic migraine without aura, not intractable, without status migrainosus: Secondary | ICD-10-CM | POA: Diagnosis not present

## 2018-09-15 DIAGNOSIS — I480 Paroxysmal atrial fibrillation: Secondary | ICD-10-CM | POA: Diagnosis not present

## 2018-09-15 DIAGNOSIS — Z0000 Encounter for general adult medical examination without abnormal findings: Secondary | ICD-10-CM | POA: Diagnosis not present

## 2018-09-15 DIAGNOSIS — Z79899 Other long term (current) drug therapy: Secondary | ICD-10-CM | POA: Diagnosis not present

## 2018-10-12 DIAGNOSIS — Z1211 Encounter for screening for malignant neoplasm of colon: Secondary | ICD-10-CM | POA: Diagnosis not present

## 2018-10-20 ENCOUNTER — Other Ambulatory Visit: Payer: Self-pay | Admitting: Cardiovascular Disease

## 2018-10-23 NOTE — Telephone Encounter (Signed)
Refill request

## 2018-10-23 NOTE — Telephone Encounter (Signed)
Pt needs appt w/ Dr. Rockey Situ for refills on Xarelto and labs.

## 2018-10-23 NOTE — Telephone Encounter (Signed)
Pt's age 58, wt 133.3 kg, overdue for BMET to assess kidney function, due to see Dr. Rockey Situ.  Will send in 30 day supply and note to scheduling to have pt make appt.

## 2018-10-24 NOTE — Telephone Encounter (Signed)
LMOV to schedule  

## 2018-10-31 ENCOUNTER — Inpatient Hospital Stay: Payer: Medicare HMO | Attending: Oncology

## 2018-10-31 ENCOUNTER — Other Ambulatory Visit: Payer: Self-pay

## 2018-11-01 NOTE — Telephone Encounter (Signed)
Attempted to schedule fu Dr. Rockey Situ .  No ans no vm

## 2018-11-07 NOTE — Telephone Encounter (Signed)
Call answered call dropped.  Unable to reach patient x 3 closing this encounter.

## 2018-12-07 DIAGNOSIS — Z049 Encounter for examination and observation for unspecified reason: Secondary | ICD-10-CM | POA: Diagnosis not present

## 2018-12-07 DIAGNOSIS — H9319 Tinnitus, unspecified ear: Secondary | ICD-10-CM | POA: Diagnosis not present

## 2018-12-07 DIAGNOSIS — R42 Dizziness and giddiness: Secondary | ICD-10-CM | POA: Diagnosis not present

## 2018-12-07 DIAGNOSIS — Z79899 Other long term (current) drug therapy: Secondary | ICD-10-CM | POA: Diagnosis not present

## 2018-12-07 DIAGNOSIS — G43719 Chronic migraine without aura, intractable, without status migrainosus: Secondary | ICD-10-CM | POA: Diagnosis not present

## 2018-12-08 ENCOUNTER — Other Ambulatory Visit: Payer: Self-pay | Admitting: Cardiovascular Disease

## 2018-12-08 NOTE — Telephone Encounter (Signed)
Please review for refill, Thanks !  

## 2018-12-08 NOTE — Telephone Encounter (Signed)
Patient is overdue for appointment with Dr. Rockey Situ and for labs. Will refill for 30 days, with the note that he needs appointment for further refills

## 2018-12-11 DIAGNOSIS — R42 Dizziness and giddiness: Secondary | ICD-10-CM | POA: Diagnosis not present

## 2018-12-11 DIAGNOSIS — M542 Cervicalgia: Secondary | ICD-10-CM | POA: Diagnosis not present

## 2018-12-11 DIAGNOSIS — G518 Other disorders of facial nerve: Secondary | ICD-10-CM | POA: Diagnosis not present

## 2018-12-11 DIAGNOSIS — M791 Myalgia, unspecified site: Secondary | ICD-10-CM | POA: Diagnosis not present

## 2018-12-11 DIAGNOSIS — H9319 Tinnitus, unspecified ear: Secondary | ICD-10-CM | POA: Diagnosis not present

## 2018-12-11 DIAGNOSIS — G43719 Chronic migraine without aura, intractable, without status migrainosus: Secondary | ICD-10-CM | POA: Diagnosis not present

## 2018-12-31 NOTE — Progress Notes (Signed)
[name]  Telephone:(336) [phone] Fax:(336) [phone]  ID: Collin Holt OB: 1960/07/24  MR#: [id number]  [id number]  Patient Care Team: [name], MD as PCP - General (Internal Medicine) [name], MD as Consulting Physician (Cardiology)  CHIEF COMPLAINT:  Hereditary hemochromatosis   INTERVAL HISTORY: Patient returns to clinic today for repeat laboratory work, further evaluation, and continuation of phlebotomy.  He continues to feel well and remains asymptomatic. He has no neurologic complaints.  He denies any weakness or fatigue.  He denies any recent fevers or illnesses. He has a good appetite and denies weight loss.  He has no chest pain, shortness of breath, cough, or hemoptysis.  He denies any nausea, vomiting, constipation, or diarrhea. He has no urinary complaints.  Patient offers no specific complaints today.  REVIEW OF SYSTEMS:   Review of Systems  Constitutional: Negative.  Negative for fever, malaise/fatigue and weight loss.  Respiratory: Negative.  Negative for cough and shortness of breath.   Cardiovascular: Negative.  Negative for chest pain and leg swelling.  Gastrointestinal: Negative.  Negative for abdominal pain, blood in stool and melena.  Genitourinary: Negative.  Negative for dysuria.  Musculoskeletal: Negative.  Negative for back pain and myalgias.  Skin: Negative.  Negative for rash.  Neurological: Negative.  Negative for dizziness, weakness and headaches.  Psychiatric/Behavioral: Negative.  The patient is not nervous/anxious.     As per HPI. Otherwise, a complete review of systems is negative.  PAST MEDICAL HISTORY: Past Medical History:  Diagnosis Date  . Chronic headaches   . Diabetes mellitus without complication ([place])   . DOE (dyspnea on exertion)    a. 02/2010 Cath: nl cors;  b. 02/2010 CPX: good fxnl capacity.  Marland Kitchen HTN (hypertension)   . OSA (obstructive sleep apnea)   . PAF (paroxysmal atrial fibrillation)  ([place])    a. 2012 Echo: nl LV fxn, mod dil LA.  . Sleep apnea     PAST SURGICAL HISTORY: Past Surgical History:  Procedure Laterality Date  . SHOULDER SURGERY     Right x 2    FAMILY HISTORY Family History  Problem Relation Age of Onset  . Emphysema Father   . Cancer Father   . Prostate cancer Neg Hx   . Colon cancer Neg Hx   . Coronary artery disease Neg Hx   . Diabetes Neg Hx        ADVANCED DIRECTIVES:    HEALTH MAINTENANCE: Social History   Tobacco Use  . Smoking status: Former Smoker    Packs/day: 1.00    Years: 10.00    Pack years: 10.00    Types: Cigarettes    Quit date: 01/19/1999    Years since quitting: 19.9  . Smokeless tobacco: Former Systems developer    Quit date: 01/19/2011  Substance Use Topics  . Alcohol use: Yes    Alcohol/week: 4.0 standard drinks    Types: 4 Glasses of wine per week  . Drug use: No     Allergies  Allergen Reactions  . Morphine And Related Nausea And Vomiting  . Terfenadine Other (See Comments)    Irregular heartbeat Other reaction(s): GI Upset (intolerance)    Current Outpatient Medications  Medication Sig Dispense Refill  . amiodarone (PACERONE) 200 MG tablet Take 1 tablet (200 mg total) by mouth daily as needed. 90 tablet 3  . metoprolol succinate (TOPROL-XL) 50 MG 24 hr tablet Take 1 tablet (50 mg total) by mouth daily. Take with or immediately following a  meal. 90 tablet 2  . SUMAtriptan (IMITREX) 100 MG tablet Take 100 mg by mouth as needed.     . venlafaxine (EFFEXOR) 75 MG tablet Take 75 mg by mouth daily.      Alveda Reasons 20 MG TABS tablet TAKE ONE TABLET BY MOUTH ONE TIME DAILY with supper. need appt with Dr. Rockey Situ for more refills 30 tablet 0   No current facility-administered medications for this visit.    OBJECTIVE: Vitals:   01/02/19 1331  BP: 133/87  Pulse: (!) 51  Temp: (!) 96.4 F (35.8 C)     Body mass index is 39.7 kg/m.    ECOG FS:0 - Asymptomatic  General: Well-developed, well-nourished, no acute  distress. Eyes: Pink conjunctiva, anicteric sclera. HEENT: Normocephalic, moist mucous membranes. Lungs: No audible wheezing or coughing. Heart: Regular rate and rhythm. Abdomen: Soft, nontender, no obvious distention. Musculoskeletal: No edema, cyanosis, or clubbing. Neuro: Alert, answering all questions appropriately. Cranial nerves grossly intact. Skin: No rashes or petechiae noted. Psych: Normal affect.   LAB RESULTS:  Lab Results  Component Value Date   NA 140 10/29/2017   K 4.2 10/29/2017   CL 107 10/29/2017   CO2 23 10/29/2017   GLUCOSE 125 (H) 10/29/2017   BUN 18 10/29/2017   CREATININE 1.27 (H) 10/29/2017   CALCIUM 9.5 10/29/2017   PROT 7.2 10/29/2017   ALBUMIN 4.2 10/29/2017   AST 72 (H) 10/29/2017   ALT 89 (H) 10/29/2017   ALKPHOS 39 10/29/2017   BILITOT 1.3 (H) 10/29/2017   GFRNONAA >60 10/29/2017   GFRAA >60 10/29/2017    Lab Results  Component Value Date   WBC 5.7 01/02/2019   NEUTROABS 3.3 01/02/2019   HGB 15.4 01/02/2019   HCT 46.2 01/02/2019   MCV 96.3 01/02/2019   PLT 181 01/02/2019   Lab Results  Component Value Date   FERRITIN 495 (H) 01/02/2019     STUDIES: No results found.  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  PLAN:    1. Hereditary hemochromatosis: Patient's hemoglobin continues to be within normal limits.  His ferritin is slowly trending down and is now 495. Goal ferritin is 50-100.  Proceed with 500 mL phlebotomy today.  Return to clinic in 2 in 4 months for phlebotomy only.  Patient will then return to clinic in 6 months for further evaluation and continuation of treatment.    2. Atrial fibrillation: Continue Xarelto as ordered. 3. Hypertension: Patient's blood pressure continues to be within normal limits.  I spent a total of 15 minutes face-to-face with the patient and reviewing chart data of which greater than 50% of the visit was spent in counseling and coordination of care as  detailed above.   Patient expressed understanding that he can call or return to clinic at any time if he has any questions, concerns, or complaints.  [name], MD   01/03/2019 6:32 AM

## 2019-01-01 ENCOUNTER — Encounter: Payer: Self-pay | Admitting: Oncology

## 2019-01-01 ENCOUNTER — Other Ambulatory Visit: Payer: Self-pay

## 2019-01-01 NOTE — Progress Notes (Signed)
Patient prescreened for appointment. Patient has no concerns or questions.  

## 2019-01-02 ENCOUNTER — Inpatient Hospital Stay (HOSPITAL_BASED_OUTPATIENT_CLINIC_OR_DEPARTMENT_OTHER): Payer: Medicare HMO | Admitting: Oncology

## 2019-01-02 ENCOUNTER — Inpatient Hospital Stay: Payer: Medicare HMO | Attending: Oncology

## 2019-01-02 ENCOUNTER — Other Ambulatory Visit: Payer: Self-pay

## 2019-01-02 ENCOUNTER — Inpatient Hospital Stay: Payer: Medicare HMO

## 2019-01-02 DIAGNOSIS — Z7901 Long term (current) use of anticoagulants: Secondary | ICD-10-CM | POA: Insufficient documentation

## 2019-01-02 DIAGNOSIS — I10 Essential (primary) hypertension: Secondary | ICD-10-CM | POA: Diagnosis not present

## 2019-01-02 DIAGNOSIS — I480 Paroxysmal atrial fibrillation: Secondary | ICD-10-CM | POA: Diagnosis not present

## 2019-01-02 LAB — CBC WITH DIFFERENTIAL/PLATELET
Abs Immature Granulocytes: 0.02 10*3/uL (ref 0.00–0.07)
Basophils Absolute: 0.1 10*3/uL (ref 0.0–0.1)
Basophils Relative: 1 %
Eosinophils Absolute: 0.2 10*3/uL (ref 0.0–0.5)
Eosinophils Relative: 4 %
HCT: 46.2 % (ref 39.0–52.0)
Hemoglobin: 15.4 g/dL (ref 13.0–17.0)
Immature Granulocytes: 0 %
Lymphocytes Relative: 25 %
Lymphs Abs: 1.4 10*3/uL (ref 0.7–4.0)
MCH: 32.1 pg (ref 26.0–34.0)
MCHC: 33.3 g/dL (ref 30.0–36.0)
MCV: 96.3 fL (ref 80.0–100.0)
Monocytes Absolute: 0.6 10*3/uL (ref 0.1–1.0)
Monocytes Relative: 11 %
Neutro Abs: 3.3 10*3/uL (ref 1.7–7.7)
Neutrophils Relative %: 59 %
Platelets: 181 10*3/uL (ref 150–400)
RBC: 4.8 MIL/uL (ref 4.22–5.81)
RDW: 11.5 % (ref 11.5–15.5)
WBC: 5.7 10*3/uL (ref 4.0–10.5)
nRBC: 0 % (ref 0.0–0.2)

## 2019-01-02 LAB — FERRITIN: Ferritin: 495 ng/mL — ABNORMAL HIGH (ref 24–336)

## 2019-01-02 LAB — IRON AND TIBC
Iron: 213 ug/dL — ABNORMAL HIGH (ref 45–182)
Saturation Ratios: 64 % — ABNORMAL HIGH (ref 17.9–39.5)
TIBC: 334 ug/dL (ref 250–450)
UIBC: 121 ug/dL

## 2019-01-02 NOTE — Progress Notes (Signed)
Per MD to go by Ferritin level that was drawn on 06/30/2018 for pt to receive phlebotomy today. Pt tolerated phlebotomy well with no issues. VSS. Pt stable for discharge.   Kasten Leveque [employer]

## 2019-01-19 ENCOUNTER — Other Ambulatory Visit: Payer: Self-pay | Admitting: Cardiovascular Disease

## 2019-01-20 ENCOUNTER — Telehealth: Payer: Self-pay | Admitting: Medical

## 2019-01-20 ENCOUNTER — Other Ambulatory Visit: Payer: Self-pay | Admitting: Medical

## 2019-01-20 MED ORDER — RIVAROXABAN 20 MG PO TABS: ORAL_TABLET | ORAL | 0 refills | Status: DC

## 2019-01-20 NOTE — Telephone Encounter (Signed)
   Patient called the after hours line requesting a refill of his xarelto. There appear to be several refill requests with the last one being 12/08/2018, at which time he was not given a one month supply and asked to schedule an appointment for further refills. Will send in a 2 week supply today. Encourage him to schedule an appointment. Will send a message to the [place] office to reach out to the patient to help arrange this.   Abigail Butts, PA-C 01/20/19; 12:54 PM

## 2019-01-22 NOTE — Telephone Encounter (Signed)
Refill Request.  

## 2019-01-23 NOTE — Telephone Encounter (Signed)
LVM for patient to call and schedule

## 2019-02-02 NOTE — Telephone Encounter (Signed)
No ans no vm   °

## 2019-02-14 NOTE — Telephone Encounter (Signed)
l mom to schedule appointment

## 2019-02-14 NOTE — Telephone Encounter (Signed)
3 attempts to schedule fu appt from recall list.   Deleting recall.   

## 2019-02-15 NOTE — Progress Notes (Deleted)
Cardiology Office Note    Date:  02/15/2019   ID:  Collin Holt, DOB [birth year], MRN [id number]  PCP:  [name], MD  Cardiologist:  Ida Rogue, MD  Electrophysiologist:  None   Chief Complaint: Follow-up  History of Present Illness:   Collin Holt is a 59 y.o. male with history of normal coronary arteries by LHC in 2012, PAF, hemochromatosis, vertigo, headache disorder, OSA on CPAP, obesity, and alcohol use who presents for follow-up of his PAF.  Remote LHC from 02/2010 showed left main normal,, LAD normal, small LCx with large OM 2 normal, RCA normal, LVEF 60 to 65%, no RWMA, normal intracardiac and pulmonary arterial pressures.  Patient was admitted in 07/2013 for A. fib and converted to sinus rhythm with amiodarone.  Echo from 07/2013 showed low normal LVEF of 50 to 55%, mildly dilated left atrium, normal RV SF and cavity size, rhythm of A. fib.  He has been maintained on Xarelto, Toprol-XL, and as needed amiodarone under the direction of Dr. Rockey Situ.  He was last seen in the office in 10/2017 and did not appreciate any palpitations.  He reported not needing any as needed amiodarone.  He was having difficulty affording Xarelto as he was in the donut hole.  His weight continues to trend up with notes indicating poor diet.  No medication changes were advised.  He was seen in the ED in 10/2017 with palpitations and dyspnea following alcohol that persisted despite as needed amiodarone.  EKG showed rate controlled A. fib.  Outpatient follow-up was advised.  ***   Labs independently reviewed: 12/2018 -Hgb 15.4, PLT 181 10/2017 -TSH normal, potassium 4.2, BUN 18, serum creatinine 1.27 (baseline 0.9-1.0), albumin 4.2, AST 72, ALT 89 09/2017 -A1c 6.1, TC 169, TG 180, HDL 35, LDL 98  Past Medical History:  Diagnosis Date  . Chronic headaches   . Diabetes mellitus without complication ([place])   . DOE (dyspnea on exertion)    a. 02/2010 Cath: nl cors;  b. 02/2010 CPX: good fxnl  capacity.  Marland Kitchen HTN (hypertension)   . OSA (obstructive sleep apnea)   . PAF (paroxysmal atrial fibrillation) ([place])    a. 2012 Echo: nl LV fxn, mod dil LA.  . Sleep apnea     Past Surgical History:  Procedure Laterality Date  . SHOULDER SURGERY     Right x 2    Current Medications: No outpatient medications have been marked as taking for the 02/16/19 encounter (Appointment) with Rise Mu, PA-C.    Allergies:   Morphine and related and Terfenadine   Social History   Socioeconomic History  . Marital status: Married    Spouse name: Jackelyn Poling  . Number of children: Not on file  . Years of education: Not on file  . Highest education level: Not on file  Occupational History  . Occupation: TRUCK DRIVER    Employer: [employer]  Tobacco Use  . Smoking status: Former Smoker    Packs/day: 1.00    Years: 10.00    Pack years: 10.00    Types: Cigarettes    Quit date: 01/19/1999    Years since quitting: 20.0  . Smokeless tobacco: Former Systems developer    Quit date: 01/19/2011  Substance and Sexual Activity  . Alcohol use: Yes    Alcohol/week: 4.0 standard drinks    Types: 4 Glasses of wine per week  . Drug use: No  . Sexual activity: Not on file  Other Topics Concern  .  Not on file  Social History Narrative   HSG   Married - '82 - 17 yrs/divorced; married '06. He has 4 daughters   Regular Exercise -  NO         Social Determinants of Health   Financial Resource Strain:   . Difficulty of Paying Living Expenses: Not on file  Food Insecurity:   . Worried About Charity fundraiser in the Last Year: Not on file  . Ran Out of Food in the Last Year: Not on file  Transportation Needs:   . Lack of Transportation (Medical): Not on file  . Lack of Transportation (Non-Medical): Not on file  Physical Activity:   . Days of Exercise per Week: Not on file  . Minutes of Exercise per Session: Not on file  Stress:   . Feeling of Stress : Not on file  Social Connections:   . Frequency  of Communication with Friends and Family: Not on file  . Frequency of Social Gatherings with Friends and Family: Not on file  . Attends Religious Services: Not on file  . Active Member of Clubs or Organizations: Not on file  . Attends Archivist Meetings: Not on file  . Marital Status: Not on file     Family History:  The patient's family history includes Cancer in his father; Emphysema in his father. There is no history of Prostate cancer, Colon cancer, Coronary artery disease, or Diabetes.  ROS:   ROS   EKGs/Labs/Other Studies Reviewed:    Studies reviewed were summarized above. The additional studies were reviewed today:  2D echo 07/2013: EF 50 to 55%, mild LVH, normal RV SF and cavity size, mildly dilated left atrium, normal RVSP, rhythm A. fib, challenging image quality.   EKG:  EKG is ordered today.  The EKG ordered today demonstrates ***  Recent Labs: 01/02/2019: Hemoglobin 15.4; Platelets 181  Recent Lipid Panel    Component Value Date/Time   CHOL 195 10/23/2007 0000   TRIG 392 10/23/2007 0000   HDL 32.4 10/23/2007 0000   LDLCALC 84.2 10/23/2007 0000    PHYSICAL EXAM:    VS:  There were no vitals taken for this visit.  BMI: There is no height or weight on file to calculate BMI.  Physical Exam  Wt Readings from Last 3 Encounters:  01/02/19 292 lb 11.2 oz (132.8 kg)  06/30/18 293 lb 14.4 oz (133.3 kg)  10/29/17 292 lb (132.5 kg)     ASSESSMENT & PLAN:   1. ***  Disposition: F/u with Dr. Rockey Situ or an APP in ***.   Medication Adjustments/Labs and Tests Ordered: Current medicines are reviewed at length with the patient today.  Concerns regarding medicines are outlined above. Medication changes, Labs and Tests ordered today are summarized above and listed in the Patient Instructions accessible in Encounters.   Signed, Christell Faith, PA-C 02/15/2019 7:17 AM     [facility] [address] [place], [place] [address] [phone]

## 2019-02-16 ENCOUNTER — Ambulatory Visit: Payer: Medicare HMO | Admitting: Cardiovascular Disease

## 2019-02-19 ENCOUNTER — Encounter: Payer: Self-pay | Admitting: Cardiovascular Disease

## 2019-02-27 NOTE — Progress Notes (Signed)
Office Visit    Patient Name: Collin Holt Date of Encounter: 02/28/2019  Primary Care Provider:  [name], MD Primary Cardiologist:  Ida Rogue, MD Electrophysiologist:  None   Chief Complaint    Collin Holt is a 59 y.o. male with a hx of PAF, EtOH use, obesity, OSA, vertigo, headaches, hemochromatosis followed by hematology, HTN presents today for follow-up of his atrial fibrillation and medication management.  Past Medical History    Past Medical History:  Diagnosis Date  . Chronic headaches   . Diabetes mellitus without complication ([place])   . DOE (dyspnea on exertion)    a. 02/2010 Cath: nl cors;  b. 02/2010 CPX: good fxnl capacity.  Marland Kitchen HTN (hypertension)   . OSA (obstructive sleep apnea)   . PAF (paroxysmal atrial fibrillation) ([place])    a. 2012 Echo: nl LV fxn, mod dil LA.  . Sleep apnea    Past Surgical History:  Procedure Laterality Date  . SHOULDER SURGERY     Right x 2    Allergies  Allergies  Allergen Reactions  . Morphine And Related Nausea And Vomiting  . Terfenadine Other (See Comments)    Irregular heartbeat Other reaction(s): GI Upset (intolerance)    History of Present Illness    Collin Holt is a 59 y.o. male with a hx of PAF, OSA, obesity, vertigo, headaches, EtOH use, HTN, hemochromatosis followed by hematology last seen 10/24/2017 by Dr. Rockey Situ.  He is a prior cardiac catheterization 2012 with no significant CAD.  Echocardiogram 01/2010 with normal LVEF.  He was hospitalized July 2015 for atrial fibrillation and given amiodarone and subsequently converted to sinus rhythm.  Echocardiogram 07/2013 with LVEF 50-55%, mild LVH.  Of note, he has had difficulty with affording his Xarelto in the past.  Tells me he has intermittent episodes of atrial fibrillation. Tells me it is about the same as it has been. Lasts about 2-3 seconds. This occurs multiple times per week, but tells me it is not bothersome. Not associated with shortness of  breath nor chest pain. We discussed the possibility of a ZIO monitor to assess how often he was having atrial fibrillation and he politely declined.   Denies bleeding complications. Reports compliance with his Xarelto.  No formal exercise regimen. Endorses trying to eat a heart healthy diet.   EKGs/Labs/Other Studies Reviewed:   The following studies were reviewed today:  EKG:  EKG is ordered today.  The ekg ordered today demonstrates sinus bradycardia rate 55 bpm with left axis deviation  Recent Labs: 01/02/2019: Hemoglobin 15.4; Platelets 181  Recent Lipid Panel    Component Value Date/Time   CHOL 195 10/23/2007 0000   TRIG 392 10/23/2007 0000   HDL 32.4 10/23/2007 0000   LDLCALC 84.2 10/23/2007 0000    Home Medications   Current Meds  Medication Sig  . metoprolol succinate (TOPROL-XL) 50 MG 24 hr tablet Take 1 tablet (50 mg total) by mouth daily. Take with or immediately following a meal.  . rivaroxaban (XARELTO) 20 MG TABS tablet TAKE ONE TABLET BY MOUTH ONE TIME DAILY with supper. need appt with Dr. Rockey Situ for more refills  . SUMAtriptan (IMITREX) 100 MG tablet Take 100 mg by mouth as needed.   . venlafaxine (EFFEXOR) 75 MG tablet Take 75 mg by mouth daily.      Review of Systems      Review of Systems  Constitution: Negative for chills, fever and malaise/fatigue.  Cardiovascular: Positive for palpitations. Negative  for chest pain, dyspnea on exertion, irregular heartbeat, leg swelling, near-syncope, orthopnea and syncope.  Respiratory: Negative for cough, shortness of breath and wheezing.   Gastrointestinal: Negative for melena, nausea and vomiting.  Genitourinary: Negative for hematuria.  Neurological: Negative for dizziness, light-headedness and weakness.   All other systems reviewed and are otherwise negative except as noted above.  Physical Exam    VS:  BP (!) 142/82 (BP Location: Left Arm, Patient Position: Sitting, Cuff Size: Normal)   Pulse (!) 55   Ht 6'  (1.829 m)   Wt 298 lb 8 oz (135.4 kg)   SpO2 96%   BMI 40.48 kg/m  , BMI Body mass index is 40.48 kg/m. GEN: Well nourished, well developed, in no acute distress. HEENT: normal. Neck: Supple, no JVD, carotid bruits, or masses. Cardiac: RRR, no murmurs, rubs, or gallops. No clubbing, cyanosis, edema.  Radials/DP/PT 2+ and equal bilaterally.  Respiratory:  Respirations regular and unlabored, clear to auscultation bilaterally. GI: Soft, nontender, nondistended, BS + x 4. MS: No deformity or atrophy. Skin: Warm and dry, no rash. Neuro:  Strength and sensation are intact. Psych: Normal affect.  Accessory Clinical Findings    ECG personally reviewed by me today - SB 55 bpm with L axis deviation - no acute changes.  Assessment & Plan    1. PAF - Reports intermittent breakthrough episodes of palpitations lasting only seconds that self resolve. We discussed repeat ZIO monitor to determine whether this was atrial fib vs PVC and to determine burden, he politely declined as he is overall asymptomatic. EKG today SB. Continue Toprol 50mg  daily, refill provided.   2. Chronic anticoagulation -  Secondary to PAF. Denies bleeding complication. Hb 01/02/19 was 15.4. Continue Xarelto 20mg  daily. Refill provided.   3. OSA - Compliant with CPAP.   4. Obesity - Weight loss via healthy diet and exercise encouraged.   5. Hemochromatosis - Follows with Dr. Grayland Ormond. Notes he has been told his elevated liver enzymes are secondary to his hemachromatosis.   6. Lipid screening - Lipid panel, CMET today for monitoring as last lipid panel >1 year ago.   Disposition: Follow up in 6 month(s) with Dr. Rockey Situ.   Loel Dubonnet, NP 02/28/2019, 10:14 AM

## 2019-02-28 ENCOUNTER — Encounter: Payer: Self-pay | Admitting: Family

## 2019-02-28 ENCOUNTER — Ambulatory Visit (INDEPENDENT_AMBULATORY_CARE_PROVIDER_SITE_OTHER): Payer: Medicare HMO | Admitting: Family

## 2019-02-28 ENCOUNTER — Other Ambulatory Visit: Payer: Self-pay

## 2019-02-28 VITALS — BP 142/82 | HR 55 | Ht 72.0 in | Wt 298.5 lb

## 2019-02-28 DIAGNOSIS — G4733 Obstructive sleep apnea (adult) (pediatric): Secondary | ICD-10-CM | POA: Diagnosis not present

## 2019-02-28 DIAGNOSIS — Z13220 Encounter for screening for lipoid disorders: Secondary | ICD-10-CM | POA: Diagnosis not present

## 2019-02-28 DIAGNOSIS — I480 Paroxysmal atrial fibrillation: Secondary | ICD-10-CM

## 2019-02-28 DIAGNOSIS — I10 Essential (primary) hypertension: Secondary | ICD-10-CM | POA: Diagnosis not present

## 2019-02-28 DIAGNOSIS — Z7901 Long term (current) use of anticoagulants: Secondary | ICD-10-CM | POA: Diagnosis not present

## 2019-02-28 MED ORDER — RIVAROXABAN 20 MG PO TABS: ORAL_TABLET | ORAL | 0 refills | Status: DC

## 2019-02-28 MED ORDER — METOPROLOL SUCCINATE ER 50 MG PO TB24: 50.0000 mg | ORAL_TABLET | Freq: Every day | ORAL | 2 refills | Status: DC

## 2019-02-28 NOTE — Patient Instructions (Addendum)
Medication Instructions:  No medication changes today.  *If you need a refill on your cardiac medications before your next appointment, please call your pharmacy*  Lab Work: Your physician recommends that you return for lab work today: lipid panel, direct LDL, CMET  If you have labs (blood work) drawn today and your tests are completely normal, you will receive your results only by: Marland Kitchen MyChart Message (if you have MyChart) OR . A paper copy in the mail If you have any lab test that is abnormal or we need to change your treatment, we will call you to review the results.  Testing/Procedures: You had an EKG today. It showed sinus bradycardia which is a stable result.   Follow-Up: At [facility], you and your health needs are our priority.  As part of our continuing mission to provide you with exceptional heart care, we have created designated Provider Care Teams.  These Care Teams include your primary Cardiologist (physician) and Advanced Practice Providers (APPs -  Physician Assistants and Nurse Practitioners) who all work together to provide you with the care you need, when you need it.  Your next appointment:  6 months in person with Dr. Rockey Situ  Other:  Our goal is for your blood pressure to be less than 130/80. Please check blood pressure three times per week and keep a log.   Tips to Measure your Blood Pressure Correctly  To determine whether you have hypertension, a medical professional will take a blood pressure reading. How you prepare for the test, the position of your arm, and other factors can change a blood pressure reading by 10% or more. That could be enough to hide high blood pressure, start you on a drug you don't really need, or lead your doctor to incorrectly adjust your medications.  National and international guidelines offer specific instructions for measuring blood pressure. If a doctor, nurse, or medical assistant isn't doing it right, don't hesitate to ask him or  her to get with the guidelines.  Here's what you can do to ensure a correct reading: . Don't drink a caffeinated beverage or smoke during the 30 minutes before the test. . Sit quietly for five minutes before the test begins. . During the measurement, sit in a chair with your feet on the floor and your arm supported so your elbow is at about heart level. . The inflatable part of the cuff should completely cover at least 80% of your upper arm, and the cuff should be placed on bare skin, not over a shirt. . Don't talk during the measurement. . Have your blood pressure measured twice, with a brief break in between. If the readings are different by 5 points or more, have it done a third time.  There are times to break these rules. If you sometimes feel lightheaded when getting out of bed in the morning or when you stand after sitting, you should have your blood pressure checked while seated and then while standing to see if it falls from one position to the next.  Because blood pressure varies throughout the day, your doctor will rarely diagnose hypertension on the basis of a single reading. Instead, he or she will want to confirm the measurements on at least two occasions, usually within a few weeks of one another. The exception to this rule is if you have a blood pressure reading of 180/110 mm Hg or higher. A result this high usually calls for prompt treatment.  In 2017, new guidelines from the  American Heart Association, the [employer] of Cardiology, and nine other health organizations lowered the diagnosis of high blood pressure to 130/80 mm Hg or higher for all adults. The guidelines also redefined the various blood pressure categories to now include normal, elevated, Stage 1 hypertension, Stage 2 hypertension, and hypertensive crisis (see "Blood pressure categories").  Blood pressure categories  Blood pressure category SYSTOLIC (upper number)  DIASTOLIC (lower number)  Normal Less than 120  mm Hg and Less than 80 mm Hg  Elevated 120-129 mm Hg and Less than 80 mm Hg  High blood pressure: Stage 1 hypertension 130-139 mm Hg or 80-89 mm Hg  High blood pressure: Stage 2 hypertension 140 mm Hg or higher or 90 mm Hg or higher  Hypertensive crisis (consult your doctor immediately) Higher than 180 mm Hg and/or Higher than 120 mm Hg  Source: American Heart Association and American Stroke Association. For more on getting your blood pressure under control, buy Controlling Your Blood Pressure, a Special Health Report from [facility].   Blood Pressure Log   Date   Time  Blood Pressure  Position  Example: Nov 1 9 AM 124/78 sitting

## 2019-03-01 ENCOUNTER — Encounter: Payer: Self-pay | Admitting: Family

## 2019-03-01 ENCOUNTER — Other Ambulatory Visit: Payer: Self-pay

## 2019-03-01 DIAGNOSIS — Z13220 Encounter for screening for lipoid disorders: Secondary | ICD-10-CM

## 2019-03-01 LAB — COMPREHENSIVE METABOLIC PANEL
ALT: 83 IU/L — ABNORMAL HIGH (ref 0–44)
AST: 71 IU/L — ABNORMAL HIGH (ref 0–40)
Albumin/Globulin Ratio: 1.8 (ref 1.2–2.2)
Albumin: 4.2 g/dL (ref 3.8–4.9)
Alkaline Phosphatase: 44 IU/L (ref 39–117)
BUN/Creatinine Ratio: 10 (ref 9–20)
BUN: 10 mg/dL (ref 6–24)
Bilirubin Total: 0.8 mg/dL (ref 0.0–1.2)
CO2: 22 mmol/L (ref 20–29)
Calcium: 9.4 mg/dL (ref 8.7–10.2)
Chloride: 101 mmol/L (ref 96–106)
Creatinine, Ser: 0.98 mg/dL (ref 0.76–1.27)
GFR calc Af Amer: 98 mL/min/{1.73_m2} (ref >59)
GFR calc non Af Amer: 85 mL/min/{1.73_m2} (ref >59)
Globulin, Total: 2.4 g/dL (ref 1.5–4.5)
Glucose: 176 mg/dL — ABNORMAL HIGH (ref 65–99)
Potassium: 4.5 mmol/L (ref 3.5–5.2)
Sodium: 137 mmol/L (ref 134–144)
Total Protein: 6.6 g/dL (ref 6.0–8.5)

## 2019-03-01 LAB — LIPID PANEL
Chol/HDL Ratio: 4.3 ratio (ref 0.0–5.0)
Cholesterol, Total: 171 mg/dL (ref 100–199)
HDL: 40 mg/dL (ref >39)
LDL Chol Calc (NIH): 103 mg/dL — ABNORMAL HIGH (ref 0–99)
Triglycerides: 157 mg/dL — ABNORMAL HIGH (ref 0–149)
VLDL Cholesterol Cal: 28 mg/dL (ref 5–40)

## 2019-03-01 LAB — LDL CHOLESTEROL, DIRECT: LDL Direct: 115 mg/dL — ABNORMAL HIGH (ref 0–99)

## 2019-03-05 ENCOUNTER — Encounter: Payer: Self-pay | Admitting: *Deleted

## 2019-03-06 ENCOUNTER — Inpatient Hospital Stay: Payer: Medicare HMO | Attending: Oncology

## 2019-03-06 ENCOUNTER — Other Ambulatory Visit: Payer: Self-pay

## 2019-04-05 ENCOUNTER — Other Ambulatory Visit: Payer: Self-pay | Admitting: Cardiovascular Disease

## 2019-04-05 DIAGNOSIS — Z7901 Long term (current) use of anticoagulants: Secondary | ICD-10-CM

## 2019-04-05 DIAGNOSIS — I480 Paroxysmal atrial fibrillation: Secondary | ICD-10-CM

## 2019-04-05 NOTE — Telephone Encounter (Signed)
Refill request

## 2019-05-07 ENCOUNTER — Inpatient Hospital Stay: Payer: Medicare HMO | Attending: Oncology

## 2019-07-05 NOTE — Progress Notes (Signed)
[place]  [phone] [phone]) [phone]  ID: Collin Holt OB: Aug 30, 1960  MR#: [id number]  MVE#:[id number]  Patient Care Team: [name], MD as PCP - General (Internal Medicine) Rockey Situ Kathlene November, MD as PCP - Cardiology (Cardiology) [name], MD as Consulting Physician (Cardiology) [name], MD as Consulting Physician (Oncology)  CHIEF COMPLAINT:  Hereditary hemochromatosis   INTERVAL HISTORY: Patient returns to clinic today for repeat laboratory work, further evaluation, and continuation of phlebotomy.  He missed 1 scheduled phlebotomy in the past 6 months.  He continues to feel well and remains asymptomatic.  He has no neurologic complaints.  He denies any weakness or fatigue.  He denies any recent fevers or illnesses. He has a good appetite and denies weight loss.  He has no chest pain, shortness of breath, cough, or hemoptysis.  He denies any nausea, vomiting, constipation, or diarrhea. He has no urinary complaints.  Patient feels at his baseline offers no specific complaints today.  REVIEW OF SYSTEMS:   Review of Systems  Constitutional: Negative.  Negative for fever, malaise/fatigue and weight loss.  Respiratory: Negative.  Negative for cough and shortness of breath.   Cardiovascular: Negative.  Negative for chest pain and leg swelling.  Gastrointestinal: Negative.  Negative for abdominal pain, blood in stool and melena.  Genitourinary: Negative.  Negative for dysuria.  Musculoskeletal: Negative.  Negative for back pain and myalgias.  Skin: Negative.  Negative for rash.  Neurological: Negative.  Negative for dizziness, weakness and headaches.  Psychiatric/Behavioral: Negative.  The patient is not nervous/anxious.     As per HPI. Otherwise, a complete review of systems is negative.  PAST MEDICAL HISTORY: Past Medical History:  Diagnosis Date  . Chronic headaches   . Diabetes mellitus without complication ([place])    . DOE (dyspnea on exertion)    a. 02/2010 Cath: nl cors;  b. 02/2010 CPX: good fxnl capacity.  Marland Kitchen HTN (hypertension)   . OSA (obstructive sleep apnea)   . PAF (paroxysmal atrial fibrillation) ([place])    a. 2012 Echo: nl LV fxn, mod dil LA.  . Sleep apnea     PAST SURGICAL HISTORY: Past Surgical History:  Procedure Laterality Date  . SHOULDER SURGERY     Right x 2    FAMILY HISTORY Family History  Problem Relation Age of Onset  . Emphysema Father   . Cancer Father   . Prostate cancer Neg Hx   . Colon cancer Neg Hx   . Coronary artery disease Neg Hx   . Diabetes Neg Hx        ADVANCED DIRECTIVES:    HEALTH MAINTENANCE: Social History   Tobacco Use  . Smoking status: Former Smoker    Packs/day: 1.00    Years: 10.00    Pack years: 10.00    Types: Cigarettes    Quit date: 01/19/1999    Years since quitting: 20.4  . Smokeless tobacco: Former Systems developer    Quit date: 01/19/2011  Substance Use Topics  . Alcohol use: Yes    Alcohol/week: 4.0 standard drinks    Types: 4 Glasses of wine per week  . Drug use: No     Allergies  Allergen Reactions  . Morphine And Related Nausea And Vomiting  . Terfenadine Other (See Comments)    Irregular heartbeat Other reaction(s): GI Upset (intolerance)    Current Outpatient Medications  Medication Sig Dispense Refill  . metoprolol succinate (TOPROL-XL) 50 MG 24 hr tablet Take 1 tablet (  50 mg total) by mouth daily. Take with or immediately following a meal. 90 tablet 2  . SUMAtriptan (IMITREX) 100 MG tablet Take 100 mg by mouth as needed.     . venlafaxine (EFFEXOR) 75 MG tablet Take 75 mg by mouth daily.      Alveda Reasons 20 MG TABS tablet TAKE ONE TABLET BY MOUTH ONE TIME DAILY with supper *OFFICE VISIT NEEDED FOR FURTHER REFILLS* 90 tablet 1   No current facility-administered medications for this visit.    OBJECTIVE: Vitals:   07/09/19 1359  BP: (!) 136/93  Pulse: 64  Resp: 20  Temp: 98.5 F (36.9 C)  SpO2: 96%     Body mass  index is 37.58 kg/m.    ECOG FS:0 - Asymptomatic  General: Well-developed, well-nourished, no acute distress. Eyes: Pink conjunctiva, anicteric sclera. HEENT: Normocephalic, moist mucous membranes. Lungs: No audible wheezing or coughing. Heart: Regular rate and rhythm. Abdomen: Soft, nontender, no obvious distention. Musculoskeletal: No edema, cyanosis, or clubbing. Neuro: Alert, answering all questions appropriately. Cranial nerves grossly intact. Skin: No rashes or petechiae noted. Psych: Normal affect.  LAB RESULTS:  Lab Results  Component Value Date   NA 137 02/28/2019   K 4.5 02/28/2019   CL 101 02/28/2019   CO2 22 02/28/2019   GLUCOSE 176 (H) 02/28/2019   BUN 10 02/28/2019   CREATININE 0.98 02/28/2019   CALCIUM 9.4 02/28/2019   PROT 6.6 02/28/2019   ALBUMIN 4.2 02/28/2019   AST 71 (H) 02/28/2019   ALT 83 (H) 02/28/2019   ALKPHOS 44 02/28/2019   BILITOT 0.8 02/28/2019   GFRNONAA 85 02/28/2019   GFRAA 98 02/28/2019    Lab Results  Component Value Date   WBC 5.3 07/06/2019   NEUTROABS 3.0 07/06/2019   HGB 15.1 07/06/2019   HCT 44.7 07/06/2019   MCV 95.1 07/06/2019   PLT 187 07/06/2019   Lab Results  Component Value Date   FERRITIN 428 (H) 07/06/2019     STUDIES: No results found.  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  PLAN:    1. Hereditary hemochromatosis: Patient's hemoglobin continues to be within normal limits.  His ferritin is slowly trending down and is now 428. Goal ferritin is 50-100.  Proceed with 500 mL phlebotomy today.  Return to clinic in 2 and 4 months for phlebotomy only.  Patient will then return to clinic in 6 months with repeat laboratory work, further evaluation, and continuation of phlebotomy if needed. 2. Atrial fibrillation: Continue Xarelto as ordered. 3. Hypertension: Patient's blood pressure is mildly elevated today. 4.  Elevated liver enzymes: Mild.  Repeat at next clinic  visit.  Patient expressed understanding that he can call or return to clinic at any time if he has any questions, concerns, or complaints.  [name], MD   07/09/2019 3:12 PM

## 2019-07-06 ENCOUNTER — Other Ambulatory Visit: Payer: Self-pay | Admitting: Emergency Medicine

## 2019-07-06 ENCOUNTER — Inpatient Hospital Stay: Payer: Medicare HMO | Attending: Oncology

## 2019-07-06 ENCOUNTER — Other Ambulatory Visit: Payer: Self-pay

## 2019-07-06 DIAGNOSIS — I10 Essential (primary) hypertension: Secondary | ICD-10-CM | POA: Diagnosis not present

## 2019-07-06 DIAGNOSIS — Z125 Encounter for screening for malignant neoplasm of prostate: Secondary | ICD-10-CM | POA: Diagnosis not present

## 2019-07-06 DIAGNOSIS — E782 Mixed hyperlipidemia: Secondary | ICD-10-CM | POA: Diagnosis not present

## 2019-07-06 DIAGNOSIS — Z7901 Long term (current) use of anticoagulants: Secondary | ICD-10-CM | POA: Diagnosis not present

## 2019-07-06 DIAGNOSIS — G43709 Chronic migraine without aura, not intractable, without status migrainosus: Secondary | ICD-10-CM | POA: Diagnosis not present

## 2019-07-06 DIAGNOSIS — R748 Abnormal levels of other serum enzymes: Secondary | ICD-10-CM | POA: Diagnosis not present

## 2019-07-06 DIAGNOSIS — Z79899 Other long term (current) drug therapy: Secondary | ICD-10-CM | POA: Diagnosis not present

## 2019-07-06 DIAGNOSIS — G4733 Obstructive sleep apnea (adult) (pediatric): Secondary | ICD-10-CM | POA: Diagnosis not present

## 2019-07-06 DIAGNOSIS — R7309 Other abnormal glucose: Secondary | ICD-10-CM | POA: Diagnosis not present

## 2019-07-06 DIAGNOSIS — E349 Endocrine disorder, unspecified: Secondary | ICD-10-CM | POA: Diagnosis not present

## 2019-07-06 DIAGNOSIS — I480 Paroxysmal atrial fibrillation: Secondary | ICD-10-CM | POA: Insufficient documentation

## 2019-07-06 DIAGNOSIS — E119 Type 2 diabetes mellitus without complications: Secondary | ICD-10-CM | POA: Diagnosis not present

## 2019-07-06 DIAGNOSIS — Z0000 Encounter for general adult medical examination without abnormal findings: Secondary | ICD-10-CM | POA: Diagnosis not present

## 2019-07-06 DIAGNOSIS — R202 Paresthesia of skin: Secondary | ICD-10-CM | POA: Diagnosis not present

## 2019-07-06 DIAGNOSIS — R200 Anesthesia of skin: Secondary | ICD-10-CM | POA: Diagnosis not present

## 2019-07-06 LAB — CBC WITH DIFFERENTIAL/PLATELET
Abs Immature Granulocytes: 0.03 10*3/uL (ref 0.00–0.07)
Basophils Absolute: 0 10*3/uL (ref 0.0–0.1)
Basophils Relative: 1 %
Eosinophils Absolute: 0.2 10*3/uL (ref 0.0–0.5)
Eosinophils Relative: 3 %
HCT: 44.7 % (ref 39.0–52.0)
Hemoglobin: 15.1 g/dL (ref 13.0–17.0)
Immature Granulocytes: 1 %
Lymphocytes Relative: 31 %
Lymphs Abs: 1.7 10*3/uL (ref 0.7–4.0)
MCH: 32.1 pg (ref 26.0–34.0)
MCHC: 33.8 g/dL (ref 30.0–36.0)
MCV: 95.1 fL (ref 80.0–100.0)
Monocytes Absolute: 0.5 10*3/uL (ref 0.1–1.0)
Monocytes Relative: 9 %
Neutro Abs: 3 10*3/uL (ref 1.7–7.7)
Neutrophils Relative %: 55 %
Platelets: 187 10*3/uL (ref 150–400)
RBC: 4.7 MIL/uL (ref 4.22–5.81)
RDW: 11.7 % (ref 11.5–15.5)
WBC: 5.3 10*3/uL (ref 4.0–10.5)
nRBC: 0 % (ref 0.0–0.2)

## 2019-07-06 LAB — IRON AND TIBC
Iron: 132 ug/dL (ref 45–182)
Saturation Ratios: 41 % — ABNORMAL HIGH (ref 17.9–39.5)
TIBC: 325 ug/dL (ref 250–450)
UIBC: 193 ug/dL

## 2019-07-06 LAB — FERRITIN: Ferritin: 428 ng/mL — ABNORMAL HIGH (ref 24–336)

## 2019-07-09 ENCOUNTER — Encounter: Payer: Self-pay | Admitting: Oncology

## 2019-07-09 ENCOUNTER — Inpatient Hospital Stay: Payer: Medicare HMO

## 2019-07-09 ENCOUNTER — Other Ambulatory Visit: Payer: Self-pay

## 2019-07-09 ENCOUNTER — Inpatient Hospital Stay (HOSPITAL_BASED_OUTPATIENT_CLINIC_OR_DEPARTMENT_OTHER): Payer: Medicare HMO | Admitting: Oncology

## 2019-07-09 DIAGNOSIS — I480 Paroxysmal atrial fibrillation: Secondary | ICD-10-CM | POA: Diagnosis not present

## 2019-07-09 DIAGNOSIS — R748 Abnormal levels of other serum enzymes: Secondary | ICD-10-CM | POA: Diagnosis not present

## 2019-07-09 DIAGNOSIS — I10 Essential (primary) hypertension: Secondary | ICD-10-CM | POA: Diagnosis not present

## 2019-07-09 DIAGNOSIS — E119 Type 2 diabetes mellitus without complications: Secondary | ICD-10-CM | POA: Diagnosis not present

## 2019-07-19 DIAGNOSIS — Z7901 Long term (current) use of anticoagulants: Secondary | ICD-10-CM | POA: Diagnosis not present

## 2019-07-19 DIAGNOSIS — I480 Paroxysmal atrial fibrillation: Secondary | ICD-10-CM | POA: Diagnosis not present

## 2019-07-19 DIAGNOSIS — E538 Deficiency of other specified B group vitamins: Secondary | ICD-10-CM | POA: Diagnosis not present

## 2019-08-27 NOTE — Progress Notes (Signed)
Patient ID: Collin Holt, male   DOB: 05/22/1960, 59 y.o.   MRN: [id number] Cardiology Office Note  Date:  08/28/2019   ID:  Collin Holt, DOB Dec 27, 1960, MRN [id number]  PCP:  [name], MD   Chief Complaint  Patient presents with  . office visit    6 month F/U; Meds verbally reviewed with patient.    HPI:  Collin Holt is a 59 year old gentleman with  ETOH use Obesity, obstructive sleep apnea, long history of vertigo,  on disability for headaches and severe vertigo,  paroxysmal atrial fibrillation,   in the hospital July 2015 for atrial fibrillation. He was given amiodarone in the hospital and converted to normal sinus rhythm  Prior cardiac catheterization  2012 showing no significant CAD Prior echocardiogram 01/2010 showing normal ejection fraction  sleep apnea, compliant with his CPAP Hemochromatosis, donates a unit every 4 to 6 months He presents today for follow-up of his atrial fibrillation  Last seen in clinic October 2019 Seen by one of our other providers in this office February 2021 At that time was having intermittent episodes of atrial fibrillation, lasting few seconds at a time, was not bothered Declined his ER monitor Reported compliance with his Xarelto  Obstructive sleep apnea, uses CPAP On metoprolol, not on amiodarone as used previously  Works on motorcycles,  Lab work reviewed February 2021 LFTs elevated AST 71 ALT 83 LDL 115  On B12 shots  EKG personally reviewed by myself on todays visit Shows normal sinus rhythm rate 55 bpmno significant ST or T wave changes  Other past medical history diagnosis of hemochromatosis, followed by Dr. Grayland Ormond Also told he has a fatty liver, needs to lose weight  on disability for continued severe vertigo when he lays down in bed, severe tinnitus Denies any significant shortness of breath or chest pain with exertion   PMH:   has a past medical history of Chronic headaches, Diabetes mellitus without  complication ([place]), DOE (dyspnea on exertion), HTN (hypertension), OSA (obstructive sleep apnea), PAF (paroxysmal atrial fibrillation) ([place]), and Sleep apnea.  PSH:    Past Surgical History:  Procedure Laterality Date  . SHOULDER SURGERY     Right x 2    Current Outpatient Medications  Medication Sig Dispense Refill  . cyanocobalamin (,VITAMIN B-12,) 1000 MCG/ML injection Inject 1,000 mcg into the muscle every 30 (thirty) days.    . metoprolol succinate (TOPROL-XL) 50 MG 24 hr tablet Take 1 tablet (50 mg total) by mouth daily. Take with or immediately following a meal. 90 tablet 2  . SUMAtriptan (IMITREX) 100 MG tablet Take 100 mg by mouth as needed.     . venlafaxine (EFFEXOR) 75 MG tablet Take 75 mg by mouth daily.      Alveda Reasons 20 MG TABS tablet TAKE ONE TABLET BY MOUTH ONE TIME DAILY with supper *OFFICE VISIT NEEDED FOR FURTHER REFILLS* 90 tablet 1   No current facility-administered medications for this visit.     Allergies:   Morphine and related and Terfenadine   Social History:  The patient  reports that he quit smoking about 20 years ago. His smoking use included cigarettes. He has a 10.00 pack-year smoking history. He quit smokeless tobacco use about 8 years ago. He reports current alcohol use of about 4.0 standard drinks of alcohol per week. He reports that he does not use drugs.   Family History:   family history includes Cancer in his father; Emphysema in his father.    Review  of Systems: Review of Systems  Constitutional: Negative.   Respiratory: Negative.   Cardiovascular: Negative.   Gastrointestinal: Negative.   Musculoskeletal: Negative.   Neurological: Negative.   Psychiatric/Behavioral: Negative.   All other systems reviewed and are negative.   PHYSICAL EXAM: VS:  BP 140/90 (BP Location: Left Arm, Patient Position: Sitting, Cuff Size: Large)   Pulse (!) 55   Ht 6' (1.829 m)   Wt 272 lb 6 oz (123.5 kg)   SpO2 98%   BMI 36.94 kg/m  , BMI Body mass  index is 36.94 kg/m. Constitutional:  oriented to person, place, and time. No distress.  HENT:  Head: Normocephalic and atraumatic.  Eyes:  no discharge. No scleral icterus.  Neck: Normal range of motion. Neck supple. No JVD present.  Cardiovascular: Normal rate, regular rhythm, normal heart sounds and intact distal pulses. Exam reveals no gallop and no friction rub. No edema No murmur heard. Pulmonary/Chest: Effort normal and breath sounds normal. No stridor. No respiratory distress.  no wheezes.  no rales.  no tenderness.  Abdominal: Soft.  no distension.  no tenderness.  Musculoskeletal: Normal range of motion.  no  tenderness or deformity.  Neurological:  normal muscle tone. Coordination normal. No atrophy Skin: Skin is warm and dry. No rash noted. not diaphoretic.  Psychiatric:  normal mood and affect. behavior is normal. Thought content normal.   Recent Labs: 02/28/2019: ALT 83; BUN 10; Creatinine, Ser 0.98; Potassium 4.5; Sodium 137 07/06/2019: Hemoglobin 15.1; Platelets 187    Lipid Panel Lab Results  Component Value Date   CHOL 171 02/28/2019   HDL 40 02/28/2019   LDLCALC 103 (H) 02/28/2019   TRIG 157 (H) 02/28/2019      Wt Readings from Last 3 Encounters:  08/28/19 272 lb 6 oz (123.5 kg)  07/09/19 277 lb 1.6 oz (125.7 kg)  02/28/19 298 lb 8 oz (135.4 kg)      ASSESSMENT AND PLAN:  Atrial fibrillation, unspecified type ([place]) - Plan: EKG 12-Lead xarelto 20 mg daily metoprolol succinate 50 mg daily No sx  HYPERTENSION, BENIGN Mildly elevated, weigh coming down, will monitor  Obstructive sleep apnea On CPAP " Cannot sleep without it"  Morbid obesity due to excess calories ([place]) We have encouraged continued exercise, careful diet management in an effort to lose weight.  Hemachromatosis Periodic blood phlebotomy  Dr. Grayland Ormond monitors Drawing blood every 2 months  Encounter for anticoagulation discussion and counseling On Xarelto, CBC stable   Total  encounter time more than 25 minutes  Greater than 50% was spent in counseling and coordination of care with the patient   No orders of the defined types were placed in this encounter.    Signed, Esmond Plants, M.D., Ph.D. 08/28/2019  [place], [place]

## 2019-08-28 ENCOUNTER — Other Ambulatory Visit: Payer: Self-pay

## 2019-08-28 ENCOUNTER — Ambulatory Visit: Payer: Medicare HMO | Admitting: Cardiovascular Disease

## 2019-08-28 ENCOUNTER — Encounter: Payer: Self-pay | Admitting: Cardiovascular Disease

## 2019-08-28 VITALS — BP 140/90 | HR 55 | Ht 72.0 in | Wt 272.4 lb

## 2019-08-28 DIAGNOSIS — I480 Paroxysmal atrial fibrillation: Secondary | ICD-10-CM

## 2019-08-28 DIAGNOSIS — Z7901 Long term (current) use of anticoagulants: Secondary | ICD-10-CM

## 2019-08-28 DIAGNOSIS — G4733 Obstructive sleep apnea (adult) (pediatric): Secondary | ICD-10-CM

## 2019-08-28 DIAGNOSIS — I10 Essential (primary) hypertension: Secondary | ICD-10-CM | POA: Diagnosis not present

## 2019-08-28 MED ORDER — RIVAROXABAN 20 MG PO TABS: ORAL_TABLET | ORAL | 3 refills | Status: DC

## 2019-08-28 MED ORDER — METOPROLOL SUCCINATE ER 50 MG PO TB24: 50.0000 mg | ORAL_TABLET | Freq: Every day | ORAL | 3 refills | Status: DC

## 2019-08-28 NOTE — Patient Instructions (Addendum)
Medication Instructions:  No changes  If you need a refill on your cardiac medications before your next appointment, please call your pharmacy.    Lab work: No new labs needed   If you have labs (blood work) drawn today and your tests are completely normal, you will receive your results only by: . MyChart Message (if you have MyChart) OR . A paper copy in the mail If you have any lab test that is abnormal or we need to change your treatment, we will call you to review the results.   Testing/Procedures: No new testing needed   Follow-Up: At [facility], you and your health needs are our priority.  As part of our continuing mission to provide you with exceptional heart care, we have created designated Provider Care Teams.  These Care Teams include your primary Cardiologist (physician) and Advanced Practice Providers (APPs -  Physician Assistants and Nurse Practitioners) who all work together to provide you with the care you need, when you need it.  . You will need a follow up appointment in 12 months  . Providers on your designated Care Team:   . Christopher Berge, NP . Ryan Dunn, PA-C . Jacquelyn Visser, PA-C  Any Other Special Instructions Will Be Listed Below (If Applicable).  COVID-19 Vaccine Information can be found at: [email or web address] For questions related to vaccine distribution or appointments, please email [email or web address] or call [phone].     

## 2019-09-10 ENCOUNTER — Inpatient Hospital Stay: Payer: Medicare HMO | Attending: Oncology

## 2019-09-18 ENCOUNTER — Inpatient Hospital Stay: Payer: Medicare HMO

## 2019-09-18 DIAGNOSIS — S40861A Insect bite (nonvenomous) of right upper arm, initial encounter: Secondary | ICD-10-CM | POA: Diagnosis not present

## 2019-09-18 DIAGNOSIS — L03113 Cellulitis of right upper limb: Secondary | ICD-10-CM | POA: Diagnosis not present

## 2019-09-18 DIAGNOSIS — I10 Essential (primary) hypertension: Secondary | ICD-10-CM | POA: Diagnosis not present

## 2019-09-18 DIAGNOSIS — W57XXXA Bitten or stung by nonvenomous insect and other nonvenomous arthropods, initial encounter: Secondary | ICD-10-CM | POA: Diagnosis not present

## 2019-09-27 ENCOUNTER — Inpatient Hospital Stay: Payer: Medicare HMO | Attending: Oncology

## 2019-09-27 ENCOUNTER — Other Ambulatory Visit: Payer: Self-pay

## 2019-09-27 NOTE — Progress Notes (Signed)
500cc of blood removed via therapeutic phlebotomy. Pt tolerated well. VSS.

## 2019-11-12 ENCOUNTER — Inpatient Hospital Stay: Payer: Medicare HMO | Attending: Oncology

## 2019-12-10 ENCOUNTER — Telehealth: Payer: Self-pay | Admitting: Family

## 2019-12-10 DIAGNOSIS — Z7901 Long term (current) use of anticoagulants: Secondary | ICD-10-CM

## 2019-12-10 DIAGNOSIS — I480 Paroxysmal atrial fibrillation: Secondary | ICD-10-CM

## 2019-12-11 NOTE — Telephone Encounter (Signed)
55M, 123.5KG, 0.98SCR 02/28/19, CCR 143.5, LOVW/GOLLAN 08/28/19

## 2019-12-11 NOTE — Telephone Encounter (Signed)
*  STAT* If patient is at the pharmacy, call can be transferred to refill team.   1. Which medications need to be refilled? (please list name of each medication and dose if known)   xarelto 20 m po q d   2. Which pharmacy/location (including street and city if local pharmacy) is medication to be sent to?  [facility] [place]  3. Do they need a 30 day or 90 day supply? [place]

## 2019-12-11 NOTE — Telephone Encounter (Signed)
Pt would like to discuss Pt assistance options unable to afford Xarelto 20 mg.

## 2019-12-11 NOTE — Telephone Encounter (Signed)
Refill Request.  

## 2019-12-11 NOTE — Telephone Encounter (Signed)
Patient unable to afford refill .  He would like to discuss assistance.  Please call.

## 2019-12-12 NOTE — Telephone Encounter (Signed)
Patient calling back to check on status. Patient will be out of medication by Friday  Please advise

## 2019-12-17 NOTE — Telephone Encounter (Signed)
Patient calling to check on status.

## 2019-12-18 NOTE — Telephone Encounter (Signed)
Spoke with patient and refill has already been sent in to his pharmacy. He had no further needs or questions at this time. He was appreciative for the call back to confirm. Instructed him to call back if any further needs.

## 2019-12-25 ENCOUNTER — Inpatient Hospital Stay: Payer: Medicare HMO | Attending: Oncology

## 2019-12-25 DIAGNOSIS — I480 Paroxysmal atrial fibrillation: Secondary | ICD-10-CM | POA: Insufficient documentation

## 2019-12-25 DIAGNOSIS — I10 Essential (primary) hypertension: Secondary | ICD-10-CM | POA: Insufficient documentation

## 2019-12-25 DIAGNOSIS — R748 Abnormal levels of other serum enzymes: Secondary | ICD-10-CM | POA: Insufficient documentation

## 2019-12-25 DIAGNOSIS — Z7901 Long term (current) use of anticoagulants: Secondary | ICD-10-CM | POA: Insufficient documentation

## 2019-12-25 NOTE — Progress Notes (Signed)
Multiple attempts made by multiple nurses to obtain a PIV were unsuccessful. Pt prefers to reschedule to another date. Dr. Grayland Ormond aware and agrees with plan. Pt to stop by scheduling to reschedule to another date. Pt stable at discharge.

## 2019-12-31 ENCOUNTER — Inpatient Hospital Stay: Payer: Medicare HMO

## 2019-12-31 DIAGNOSIS — Z7901 Long term (current) use of anticoagulants: Secondary | ICD-10-CM | POA: Diagnosis not present

## 2019-12-31 DIAGNOSIS — I10 Essential (primary) hypertension: Secondary | ICD-10-CM | POA: Diagnosis not present

## 2019-12-31 DIAGNOSIS — I480 Paroxysmal atrial fibrillation: Secondary | ICD-10-CM | POA: Diagnosis not present

## 2019-12-31 DIAGNOSIS — R748 Abnormal levels of other serum enzymes: Secondary | ICD-10-CM | POA: Diagnosis not present

## 2019-12-31 NOTE — Progress Notes (Signed)
Tolerated phlebotomy well today. 528ml obtained into bag without difficulty. No complaints at time of discharge. Ambulatory. VSS.

## 2020-01-05 DIAGNOSIS — R42 Dizziness and giddiness: Secondary | ICD-10-CM | POA: Diagnosis not present

## 2020-01-05 DIAGNOSIS — I480 Paroxysmal atrial fibrillation: Secondary | ICD-10-CM | POA: Diagnosis not present

## 2020-01-05 DIAGNOSIS — H8110 Benign paroxysmal vertigo, unspecified ear: Secondary | ICD-10-CM | POA: Diagnosis not present

## 2020-01-05 DIAGNOSIS — H6980 Other specified disorders of Eustachian tube, unspecified ear: Secondary | ICD-10-CM | POA: Diagnosis not present

## 2020-01-13 NOTE — Progress Notes (Signed)
[facility]  [phone]) [phone] [phone]) [phone]  ID: [name] OB: 12-08-1960  MR#: [id number]  TFT#:[id number]  Patient Care Team: [name], MD as PCP - General (Internal Medicine) Rockey Situ Kathlene November, MD as PCP - Cardiology (Cardiology) [name], MD as Consulting Physician (Cardiology) [name], MD as Consulting Physician (Oncology)  CHIEF COMPLAINT:  Hereditary hemochromatosis   INTERVAL HISTORY: Patient returns to clinic today for routine 21-month evaluation and continuation of phlebotomy.  He continues to feel well and remains asymptomatic. He has no neurologic complaints.  He denies any weakness or fatigue.  He denies any recent fevers or illnesses. He has a good appetite and denies weight loss.  He has no chest pain, shortness of breath, cough, or hemoptysis.  He denies any nausea, vomiting, constipation, or diarrhea. He has no urinary complaints.  Patient offers no specific complaints today.  REVIEW OF SYSTEMS:   Review of Systems  Constitutional: Negative.  Negative for fever, malaise/fatigue and weight loss.  Respiratory: Negative.  Negative for cough and shortness of breath.   Cardiovascular: Negative.  Negative for chest pain and leg swelling.  Gastrointestinal: Negative.  Negative for abdominal pain, blood in stool and melena.  Genitourinary: Negative.  Negative for dysuria.  Musculoskeletal: Negative.  Negative for back pain and myalgias.  Skin: Negative.  Negative for rash.  Neurological: Negative.  Negative for dizziness, weakness and headaches.  Psychiatric/Behavioral: Negative.  The patient is not nervous/anxious.     As per HPI. Otherwise, a complete review of systems is negative.  PAST MEDICAL HISTORY: Past Medical History:  Diagnosis Date  . Chronic headaches   . Diabetes mellitus without complication ([place])   . DOE (dyspnea on exertion)    a. 02/2010 Cath: nl cors;  b. 02/2010 CPX: good fxnl capacity.  Marland Kitchen  HTN (hypertension)   . OSA (obstructive sleep apnea)   . PAF (paroxysmal atrial fibrillation) ([place])    a. 2012 Echo: nl LV fxn, mod dil LA.  . Sleep apnea     PAST SURGICAL HISTORY: Past Surgical History:  Procedure Laterality Date  . SHOULDER SURGERY     Right x 2    FAMILY HISTORY Family History  Problem Relation Age of Onset  . Emphysema Father   . Cancer Father   . Prostate cancer Neg Hx   . Colon cancer Neg Hx   . Coronary artery disease Neg Hx   . Diabetes Neg Hx        ADVANCED DIRECTIVES:    HEALTH MAINTENANCE: Social History   Tobacco Use  . Smoking status: Former Smoker    Packs/day: 1.00    Years: 10.00    Pack years: 10.00    Types: Cigarettes    Quit date: 01/19/1999    Years since quitting: 21.0  . Smokeless tobacco: Former Systems developer    Quit date: 01/19/2011  Vaping Use  . Vaping Use: Never used  Substance Use Topics  . Alcohol use: Yes    Alcohol/week: 4.0 standard drinks    Types: 4 Glasses of wine per week    Comment: daily  . Drug use: No     Allergies  Allergen Reactions  . Morphine And Related Nausea And Vomiting  . Terfenadine Other (See Comments)    Irregular heartbeat Other reaction(s): GI Upset (intolerance)    Current Outpatient Medications  Medication Sig Dispense Refill  . cyanocobalamin (,VITAMIN B-12,) 1000 MCG/ML injection Inject 1,000 mcg into the muscle every 30 (thirty)  days.    . metoprolol succinate (TOPROL-XL) 50 MG 24 hr tablet Take 1 tablet (50 mg total) by mouth daily. Take with or immediately following a meal. 90 tablet 3  . rivaroxaban (XARELTO) 20 MG TABS tablet Take 1 tablet (20 mg total) by mouth daily with supper. 90 tablet 1  . SUMAtriptan (IMITREX) 100 MG tablet Take 100 mg by mouth as needed.     . venlafaxine (EFFEXOR) 75 MG tablet Take 75 mg by mouth daily.       No current facility-administered medications for this visit.    OBJECTIVE: Vitals:   01/17/20 1429  BP: (!) 155/80  Pulse: (!) 51  Temp:  97.9 F (36.6 C)  SpO2: 98%     Body mass index is 38.5 kg/m.    ECOG FS:0 - Asymptomatic  General: Well-developed, well-nourished, no acute distress. Eyes: Pink conjunctiva, anicteric sclera. HEENT: Normocephalic, moist mucous membranes. Lungs: No audible wheezing or coughing. Heart: Regular rate and rhythm. Abdomen: Soft, nontender, no obvious distention. Musculoskeletal: No edema, cyanosis, or clubbing. Neuro: Alert, answering all questions appropriately. Cranial nerves grossly intact. Skin: No rashes or petechiae noted. Psych: Normal affect.  LAB RESULTS:  Lab Results  Component Value Date   NA 136 01/16/2020   K 4.0 01/16/2020   CL 103 01/16/2020   CO2 22 01/16/2020   GLUCOSE 100 (H) 01/16/2020   BUN 12 01/16/2020   CREATININE 0.87 01/16/2020   CALCIUM 8.9 01/16/2020   PROT 7.3 01/16/2020   ALBUMIN 4.0 01/16/2020   AST 52 (H) 01/16/2020   ALT 66 (H) 01/16/2020   ALKPHOS 31 (L) 01/16/2020   BILITOT 1.2 01/16/2020   GFRNONAA >60 01/16/2020   GFRAA 98 02/28/2019    Lab Results  Component Value Date   WBC 7.5 01/16/2020   NEUTROABS 4.3 01/16/2020   HGB 15.4 01/16/2020   HCT 44.9 01/16/2020   MCV 97.0 01/16/2020   PLT 189 01/16/2020   Lab Results  Component Value Date   FERRITIN 425 (H) 01/16/2020     STUDIES: No results found.  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  PLAN:    1. Hereditary hemochromatosis: Patient's hemoglobin and iron stores continue to be within normal limits.  His most recent ferritin is 425.  Goal ferritin is 50-100.  Proceed with 500 mL phlebotomy next week.  Patient will then return to clinic in 2 and 4 months for phlebotomy only and then in 6 months with repeat laboratory work, further evaluation, and continuation of treatment if needed. 2. Atrial fibrillation: Continue Xarelto as ordered. 3. Hypertension: Chronic and unchanged.  Continue follow-up and treatment per primary care. 4.   Elevated liver enzymes: Slightly improved.  Monitor.  Patient expressed understanding that he can call or return to clinic at any time if he has any questions, concerns, or complaints.  [name], MD   01/18/2020 8:32 AM

## 2020-01-16 ENCOUNTER — Inpatient Hospital Stay: Payer: Medicare HMO

## 2020-01-16 ENCOUNTER — Other Ambulatory Visit: Payer: Self-pay

## 2020-01-16 DIAGNOSIS — Z7901 Long term (current) use of anticoagulants: Secondary | ICD-10-CM | POA: Diagnosis not present

## 2020-01-16 DIAGNOSIS — R748 Abnormal levels of other serum enzymes: Secondary | ICD-10-CM | POA: Diagnosis not present

## 2020-01-16 DIAGNOSIS — I480 Paroxysmal atrial fibrillation: Secondary | ICD-10-CM | POA: Diagnosis not present

## 2020-01-16 DIAGNOSIS — I10 Essential (primary) hypertension: Secondary | ICD-10-CM | POA: Diagnosis not present

## 2020-01-16 LAB — CBC WITH DIFFERENTIAL/PLATELET
Abs Immature Granulocytes: 0.05 10*3/uL (ref 0.00–0.07)
Basophils Absolute: 0.1 10*3/uL (ref 0.0–0.1)
Basophils Relative: 1 %
Eosinophils Absolute: 0.3 10*3/uL (ref 0.0–0.5)
Eosinophils Relative: 4 %
HCT: 44.9 % (ref 39.0–52.0)
Hemoglobin: 15.4 g/dL (ref 13.0–17.0)
Immature Granulocytes: 1 %
Lymphocytes Relative: 27 %
Lymphs Abs: 2.1 10*3/uL (ref 0.7–4.0)
MCH: 33.3 pg (ref 26.0–34.0)
MCHC: 34.3 g/dL (ref 30.0–36.0)
MCV: 97 fL (ref 80.0–100.0)
Monocytes Absolute: 0.8 10*3/uL (ref 0.1–1.0)
Monocytes Relative: 10 %
Neutro Abs: 4.3 10*3/uL (ref 1.7–7.7)
Neutrophils Relative %: 57 %
Platelets: 189 10*3/uL (ref 150–400)
RBC: 4.63 MIL/uL (ref 4.22–5.81)
RDW: 11.9 % (ref 11.5–15.5)
WBC: 7.5 10*3/uL (ref 4.0–10.5)
nRBC: 0 % (ref 0.0–0.2)

## 2020-01-16 LAB — COMPREHENSIVE METABOLIC PANEL
ALT: 66 U/L — ABNORMAL HIGH (ref 0–44)
AST: 52 U/L — ABNORMAL HIGH (ref 15–41)
Albumin: 4 g/dL (ref 3.5–5.0)
Alkaline Phosphatase: 31 U/L — ABNORMAL LOW (ref 38–126)
Anion gap: 11 (ref 5–15)
BUN: 12 mg/dL (ref 6–20)
CO2: 22 mmol/L (ref 22–32)
Calcium: 8.9 mg/dL (ref 8.9–10.3)
Chloride: 103 mmol/L (ref 98–111)
Creatinine, Ser: 0.87 mg/dL (ref 0.61–1.24)
GFR, Estimated: >60 mL/min (ref >60)
Glucose, Bld: 100 mg/dL — ABNORMAL HIGH (ref 70–99)
Potassium: 4 mmol/L (ref 3.5–5.1)
Sodium: 136 mmol/L (ref 135–145)
Total Bilirubin: 1.2 mg/dL (ref 0.3–1.2)
Total Protein: 7.3 g/dL (ref 6.5–8.1)

## 2020-01-16 LAB — FERRITIN: Ferritin: 425 ng/mL — ABNORMAL HIGH (ref 24–336)

## 2020-01-16 LAB — IRON AND TIBC
Iron: 153 ug/dL (ref 45–182)
Saturation Ratios: 39 % (ref 17.9–39.5)
TIBC: 396 ug/dL (ref 250–450)
UIBC: 243 ug/dL

## 2020-01-17 ENCOUNTER — Inpatient Hospital Stay: Payer: Medicare HMO | Admitting: Oncology

## 2020-01-17 ENCOUNTER — Inpatient Hospital Stay: Payer: Medicare HMO

## 2020-01-24 ENCOUNTER — Inpatient Hospital Stay: Payer: Medicare HMO | Attending: Oncology

## 2020-01-30 DIAGNOSIS — D2262 Melanocytic nevi of left upper limb, including shoulder: Secondary | ICD-10-CM | POA: Diagnosis not present

## 2020-01-30 DIAGNOSIS — D225 Melanocytic nevi of trunk: Secondary | ICD-10-CM | POA: Diagnosis not present

## 2020-01-30 DIAGNOSIS — D2272 Melanocytic nevi of left lower limb, including hip: Secondary | ICD-10-CM | POA: Diagnosis not present

## 2020-01-30 DIAGNOSIS — D2261 Melanocytic nevi of right upper limb, including shoulder: Secondary | ICD-10-CM | POA: Diagnosis not present

## 2020-01-30 DIAGNOSIS — L821 Other seborrheic keratosis: Secondary | ICD-10-CM | POA: Diagnosis not present

## 2020-01-30 DIAGNOSIS — L570 Actinic keratosis: Secondary | ICD-10-CM | POA: Diagnosis not present

## 2020-01-30 DIAGNOSIS — D485 Neoplasm of uncertain behavior of skin: Secondary | ICD-10-CM | POA: Diagnosis not present

## 2020-01-30 DIAGNOSIS — C44629 Squamous cell carcinoma of skin of left upper limb, including shoulder: Secondary | ICD-10-CM | POA: Diagnosis not present

## 2020-01-30 DIAGNOSIS — R208 Other disturbances of skin sensation: Secondary | ICD-10-CM | POA: Diagnosis not present

## 2020-01-30 DIAGNOSIS — D2271 Melanocytic nevi of right lower limb, including hip: Secondary | ICD-10-CM | POA: Diagnosis not present

## 2020-02-14 ENCOUNTER — Inpatient Hospital Stay: Payer: Medicare HMO | Attending: Oncology

## 2020-02-14 ENCOUNTER — Other Ambulatory Visit: Payer: Self-pay

## 2020-02-14 NOTE — Progress Notes (Signed)
500 ml phlebotomy completed. Tolerated well. Declined any snacks; Had his own water with him. VSS at discharge. Ambulatory.

## 2020-02-14 NOTE — Patient Instructions (Signed)

## 2020-02-15 DIAGNOSIS — Z1211 Encounter for screening for malignant neoplasm of colon: Secondary | ICD-10-CM | POA: Diagnosis not present

## 2020-02-15 DIAGNOSIS — J9811 Atelectasis: Secondary | ICD-10-CM | POA: Diagnosis not present

## 2020-02-15 DIAGNOSIS — I480 Paroxysmal atrial fibrillation: Secondary | ICD-10-CM | POA: Diagnosis not present

## 2020-02-15 DIAGNOSIS — Z79899 Other long term (current) drug therapy: Secondary | ICD-10-CM | POA: Diagnosis not present

## 2020-02-15 DIAGNOSIS — R7309 Other abnormal glucose: Secondary | ICD-10-CM | POA: Diagnosis not present

## 2020-02-15 DIAGNOSIS — I10 Essential (primary) hypertension: Secondary | ICD-10-CM | POA: Diagnosis not present

## 2020-02-15 DIAGNOSIS — Z0000 Encounter for general adult medical examination without abnormal findings: Secondary | ICD-10-CM | POA: Diagnosis not present

## 2020-02-20 DIAGNOSIS — D2362 Other benign neoplasm of skin of left upper limb, including shoulder: Secondary | ICD-10-CM | POA: Diagnosis not present

## 2020-02-20 DIAGNOSIS — C44629 Squamous cell carcinoma of skin of left upper limb, including shoulder: Secondary | ICD-10-CM | POA: Diagnosis not present

## 2020-05-09 ENCOUNTER — Telehealth: Payer: Self-pay | Admitting: Cardiovascular Disease

## 2020-05-09 ENCOUNTER — Other Ambulatory Visit: Payer: Self-pay | Admitting: Gastroenterology

## 2020-05-09 DIAGNOSIS — Z7901 Long term (current) use of anticoagulants: Secondary | ICD-10-CM | POA: Diagnosis not present

## 2020-05-09 DIAGNOSIS — K760 Fatty (change of) liver, not elsewhere classified: Secondary | ICD-10-CM | POA: Diagnosis not present

## 2020-05-09 DIAGNOSIS — Z1211 Encounter for screening for malignant neoplasm of colon: Secondary | ICD-10-CM | POA: Diagnosis not present

## 2020-05-09 DIAGNOSIS — R7401 Elevation of levels of liver transaminase levels: Secondary | ICD-10-CM | POA: Diagnosis not present

## 2020-05-09 NOTE — Telephone Encounter (Signed)
   [facility] Pre-operative Risk Assessment    Patient Name: Collin Holt  DOB: 21-Nov-1960  MRN: [id number]   HEARTCARE STAFF: - Please ensure there is not already an duplicate clearance open for this procedure. - Under Visit Info/Reason for Call, type in Other and utilize the format Clearance MM/DD/YY or Clearance TBD. Do not use dashes or single digits. - If request is for dental extraction, please clarify the # of teeth to be extracted.  Request for surgical clearance:  1. What type of surgery is being performed? Colonoscopy    2. When is this surgery scheduled? 08/12/20  3. What type of clearance is required (medical clearance vs. Pharmacy clearance to hold med vs. Both)? both  4. Are there any medications that need to be held prior to surgery and how long? Hold Xarelto x 3 days  5. Practice name and name of physician performing surgery? [facility]  6. What is the office phone number?  [phone]   7.   What is the office fax number? [phone]  8.   Anesthesia type (None, local, MAC, general) ? Not listed    Ace Gins 05/09/2020, 2:32 PM  _________________________________________________________________   (provider comments below)

## 2020-05-09 NOTE — Telephone Encounter (Signed)
   Name: CORDARIUS BENNING  DOB: [birth year]  MRN: [id number]  Primary Cardiologist: Ida Rogue, MD  Chart reviewed as part of pre-operative protocol coverage. Given colonoscopy is not scheduled for another 3 months and patient will be due for annual follow-up with Dr. Rockey Situ 08/2020, favor office visit late June/early July 2022 to better assess preoperative cardiovascular risk.  Pre-op covering staff: - Please schedule appointment and call patient to inform them. Please add "pre-op clearance" to the appointment notes so provider is aware. - Please contact requesting surgeon's office via preferred method (i.e, phone, fax) to inform them of need for appointment prior to surgery.  If applicable, this message will also be routed to pharmacy pool and/or primary cardiologist for input on holding anticoagulant/antiplatelet agent as requested below so that this information is available to the clearing provider at time of patient's appointment.   Abigail Butts, PA-C  05/09/2020, 2:45 PM

## 2020-05-12 NOTE — Telephone Encounter (Signed)
Left message for pt to call back and schedule a pre op appt with Dr. Rockey Situ or APP in late June/early July. Procedure is set for 08/12/20.

## 2020-05-12 NOTE — Telephone Encounter (Signed)
Patient with diagnosis of PAF on Xarelto for anticoagulation.    Procedure: Colonoscopy Date of procedure: 08/12/20   CHA2DS2-VASc Score = 1  This indicates a 0.6% annual risk of stroke. The patient's score is based upon: CHF History: No HTN History: Yes Diabetes History: No Stroke History: No Vascular Disease History: No Age Score: 0 Gender Score: 0       CrCl 110 mL/min Platelet count 189K  Per office protocol, patient can hold Xarelto for 1-2  days prior to procedure.

## 2020-05-15 ENCOUNTER — Inpatient Hospital Stay: Payer: Medicare HMO | Attending: Oncology

## 2020-05-15 ENCOUNTER — Other Ambulatory Visit: Payer: Self-pay

## 2020-05-15 NOTE — Patient Instructions (Signed)
[facility]  Discharge Instructions: Thank you for choosing [facility] to provide your oncology and hematology care.  If you have a lab appointment with the Cancer Center, please go directly to the Cancer Center and check in at the registration area.  Wear comfortable clothing and clothing appropriate for easy access to any Portacath or PICC line.   We strive to give you quality time with your provider. You may need to reschedule your appointment if you arrive late (15 or more minutes).  Arriving late affects you and other patients whose appointments are after yours.  Also, if you miss three or more appointments without notifying the office, you may be dismissed from the clinic at the provider's discretion.      For prescription refill requests, have your pharmacy contact our office and allow 72 hours for refills to be completed.    BELOW ARE SYMPTOMS THAT SHOULD BE REPORTED IMMEDIATELY: *FEVER GREATER THAN 100.4 F (38 C) OR HIGHER *CHILLS OR SWEATING *NAUSEA AND VOMITING THAT IS NOT CONTROLLED WITH YOUR NAUSEA MEDICATION *UNUSUAL SHORTNESS OF BREATH *UNUSUAL BRUISING OR BLEEDING *URINARY PROBLEMS (pain or burning when urinating, or frequent urination) *BOWEL PROBLEMS (unusual diarrhea, constipation, pain near the anus) TENDERNESS IN MOUTH AND THROAT WITH OR WITHOUT PRESENCE OF ULCERS (sore throat, sores in mouth, or a toothache) UNUSUAL RASH, SWELLING OR PAIN  UNUSUAL VAGINAL DISCHARGE OR ITCHING   Items with * indicate a potential emergency and should be followed up as soon as possible or go to the Emergency Department if any problems should occur.  Please show the CHEMOTHERAPY ALERT CARD or IMMUNOTHERAPY ALERT CARD at check-in to the Emergency Department and triage nurse.  Should you have questions after your visit or need to cancel or reschedule your appointment, please contact [facility]   [phone] and follow the prompts.  Office hours are 8:00 a.m. to 4:30 p.m. Monday - Friday. Please note that voicemails left after 4:00 p.m. may not be returned until the following business day.  We are closed weekends and major holidays. You have access to a nurse at all times for urgent questions. Please call the main number to the clinic [phone] and follow the prompts.  For any non-urgent questions, you may also contact your provider using MyChart. We now offer e-Visits for anyone 18 and older to request care online for non-urgent symptoms. For details visit [email or web address].   Also download the MyChart app! Go to the app store, search "MyChart", open the app, select [facility], and log in with your MyChart username and password.  Due to Covid, a mask is required upon entering the hospital/clinic. If you do not have a mask, one will be given to you upon arrival. For doctor visits, patients may have 1 support person aged 18 or older with them. For treatment visits, patients cannot have anyone with them due to current Covid guidelines and our immunocompromised population.  

## 2020-05-15 NOTE — Progress Notes (Signed)
Pt had therapuetic phlebotomy performed per order, 500cc of blood removed. VSS @ d/c.

## 2020-05-16 ENCOUNTER — Other Ambulatory Visit (HOSPITAL_COMMUNITY): Payer: Self-pay | Admitting: Gastroenterology

## 2020-05-16 DIAGNOSIS — K760 Fatty (change of) liver, not elsewhere classified: Secondary | ICD-10-CM

## 2020-05-16 DIAGNOSIS — R7401 Elevation of levels of liver transaminase levels: Secondary | ICD-10-CM

## 2020-05-16 NOTE — Telephone Encounter (Signed)
Left message x 2 for pt to call the office to schedule pre op appt. See previous note.

## 2020-05-22 ENCOUNTER — Other Ambulatory Visit: Payer: Self-pay

## 2020-05-22 ENCOUNTER — Ambulatory Visit (HOSPITAL_COMMUNITY)
Admission: RE | Admit: 2020-05-22 | Discharge: 2020-05-22 | Disposition: A | Discharge status: Home / Self Care | Payer: Medicare HMO | Source: Ambulatory Visit | Attending: Gastroenterology | Admitting: Gastroenterology

## 2020-05-22 DIAGNOSIS — R7401 Elevation of levels of liver transaminase levels: Secondary | ICD-10-CM | POA: Insufficient documentation

## 2020-05-22 DIAGNOSIS — K760 Fatty (change of) liver, not elsewhere classified: Secondary | ICD-10-CM | POA: Insufficient documentation

## 2020-05-23 NOTE — Telephone Encounter (Signed)
Pt has appt 05/30/20 with Dr. Rockey Situ for pre op clearance. I will forward clearance note to MD for upcoming appt.

## 2020-05-23 NOTE — Telephone Encounter (Signed)
Left message x 3 to please call the office [phone] to schedule a pre op appt with Dr. Rockey Situ or APP. Left message though his procedure 08/12/20 our schedules are booking up very quickly.

## 2020-05-29 NOTE — Progress Notes (Signed)
Patient ID: Collin Holt, male   DOB: 11/17/1960, 60 y.o.   MRN: [id number] Cardiology Office Note  Date:  05/30/2020   ID:  JADRIEN NARINE, DOB August 29, 1960, MRN [id number]  PCP:  [name], MD   Chief Complaint  Patient presents with  . Pre op clearance for colonoscopy     "doing well." Medications reviewed by the patient verbally.     HPI:  Collin Holt is a 60 year old gentleman with  ETOH use Obesity, obstructive sleep apnea, long history of vertigo,  on disability for headaches and severe vertigo,  paroxysmal atrial fibrillation,   in the hospital July 2015 for atrial fibrillation. He was given amiodarone in the hospital and converted to normal sinus rhythm  Prior cardiac catheterization  2012 showing no significant CAD Prior echocardiogram 01/2010 showing normal ejection fraction  sleep apnea, compliant with his CPAP Hemochromatosis, donates a unit every 4 to 6 months He presents today for follow-up of his atrial fibrillation  Last seen in clinic August 2021  Was taking metoprolol, not on amiodarone On Xarelto 20  History of sleep apnea, on CPAP  In follow-up today reports he is doing well Spends time, working on motorcycles On disability for severe vertigo History of hemochromatosis, followed by Dr. Grayland Ormond  Colonoscopy pending, needs to come off Xarelto Denies significant tachypalpitations concerning for recurrent atrial fibrillation  CT scan abdomen from 2017 pulled up and reviewed no aortic atherosclerosis extending into the iliac vessels  Lab work reviewed A1c 6.0 Normal LFTs Total cholesterol 168 LDL 101  EKG personally reviewed by myself on todays visit Shows normal sinus rhythm rate 51 bpm no significant ST or T wave changes  Other past medical history diagnosis of hemochromatosis, followed by Dr. Grayland Ormond Also told he has a fatty liver, needs to lose weight  on disability for continued severe vertigo when he lays down in bed, severe  tinnitus Denies any significant shortness of breath or chest pain with exertion   PMH:   has a past medical history of Chronic headaches, Diabetes mellitus without complication ([place]), DOE (dyspnea on exertion), HTN (hypertension), OSA (obstructive sleep apnea), PAF (paroxysmal atrial fibrillation) ([place]), and Sleep apnea.  PSH:    Past Surgical History:  Procedure Laterality Date  . SHOULDER SURGERY     Right x 2    Current Outpatient Medications  Medication Sig Dispense Refill  . cyanocobalamin (,VITAMIN B-12,) 1000 MCG/ML injection Inject 1,000 mcg into the muscle every 30 (thirty) days.    . metoprolol succinate (TOPROL-XL) 50 MG 24 hr tablet Take 1 tablet (50 mg total) by mouth daily. Take with or immediately following a meal. 90 tablet 3  . rivaroxaban (XARELTO) 20 MG TABS tablet Take 1 tablet (20 mg total) by mouth daily with supper. 90 tablet 1  . SUMAtriptan (IMITREX) 100 MG tablet Take 100 mg by mouth as needed.     . tadalafil (CIALIS) 20 MG tablet Take 20 mg by mouth daily as needed.    . venlafaxine (EFFEXOR) 75 MG tablet Take 75 mg by mouth daily.     No current facility-administered medications for this visit.     Allergies:   Morphine and related and Terfenadine   Social History:  The patient  reports that he quit smoking about 21 years ago. His smoking use included cigarettes. He has a 10.00 pack-year smoking history. He quit smokeless tobacco use about 9 years ago. He reports current alcohol use of about 4.0 standard drinks of alcohol  per week. He reports that he does not use drugs.   Family History:   family history includes Cancer in his father; Emphysema in his father.    Review of Systems: Review of Systems  Constitutional: Negative.   Respiratory: Negative.   Cardiovascular: Negative.   Gastrointestinal: Negative.   Musculoskeletal: Negative.   Neurological: Negative.   Psychiatric/Behavioral: Negative.   All other systems reviewed and are  negative.   PHYSICAL EXAM: VS:  BP 134/80 (BP Location: Left Arm, Patient Position: Sitting, Cuff Size: Large)   Pulse (!) 51   Ht 6' (1.829 m)   Wt 282 lb 8 oz (128.1 kg)   SpO2 98%   BMI 38.31 kg/m  , BMI Body mass index is 38.31 kg/m. Constitutional:  oriented to person, place, and time. No distress.  HENT:  Head: Grossly normal Eyes:  no discharge. No scleral icterus.  Neck: No JVD, no carotid bruits  Cardiovascular: Regular rate and rhythm, no murmurs appreciated Pulmonary/Chest: Clear to auscultation bilaterally, no wheezes or rails Abdominal: Soft.  no distension.  no tenderness.  Musculoskeletal: Normal range of motion Neurological:  normal muscle tone. Coordination normal. No atrophy Skin: Skin warm and dry Psychiatric: normal affect, pleasant  Recent Labs: 01/16/2020: ALT 66; BUN 12; Creatinine, Ser 0.87; Hemoglobin 15.4; Platelets 189; Potassium 4.0; Sodium 136    Lipid Panel Lab Results  Component Value Date   CHOL 171 02/28/2019   HDL 40 02/28/2019   LDLCALC 103 (H) 02/28/2019   TRIG 157 (H) 02/28/2019      Wt Readings from Last 3 Encounters:  05/30/20 282 lb 8 oz (128.1 kg)  01/17/20 283 lb 14.4 oz (128.8 kg)  08/28/19 272 lb 6 oz (123.5 kg)     ASSESSMENT AND PLAN:  Atrial fibrillation, unspecified type ([place]) - Plan: EKG 12-Lead xarelto 20 mg daily metoprolol succinate 50 mg daily No sx No changes to the medications  HYPERTENSION, BENIGN Blood pressure is well controlled on today's visit. No changes made to the medications.  Obstructive sleep apnea On CPAP " Cannot sleep without it" Has dramatically helped his atrial fibrillation  Morbid obesity due to excess calories ([place]) We have encouraged continued exercise, careful diet management in an effort to lose weight.  Hemachromatosis Periodic blood phlebotomy  Dr. Grayland Ormond monitors Drawing blood every 2 months    Total encounter time more than 25 minutes  Greater than 50% was spent in  counseling and coordination of care with the patient   Orders Placed This Encounter  Procedures  . EKG 12-Lead     Signed, Esmond Plants, M.D., Ph.D. 05/30/2020  [place], [place]

## 2020-05-30 ENCOUNTER — Ambulatory Visit: Payer: Medicare HMO | Admitting: Cardiovascular Disease

## 2020-05-30 ENCOUNTER — Other Ambulatory Visit: Payer: Self-pay

## 2020-05-30 ENCOUNTER — Encounter: Payer: Self-pay | Admitting: Cardiovascular Disease

## 2020-05-30 VITALS — BP 134/80 | HR 51 | Ht 72.0 in | Wt 282.5 lb

## 2020-05-30 DIAGNOSIS — I480 Paroxysmal atrial fibrillation: Secondary | ICD-10-CM | POA: Diagnosis not present

## 2020-05-30 DIAGNOSIS — I10 Essential (primary) hypertension: Secondary | ICD-10-CM | POA: Diagnosis not present

## 2020-05-30 DIAGNOSIS — G4733 Obstructive sleep apnea (adult) (pediatric): Secondary | ICD-10-CM | POA: Diagnosis not present

## 2020-05-30 DIAGNOSIS — E119 Type 2 diabetes mellitus without complications: Secondary | ICD-10-CM | POA: Diagnosis not present

## 2020-05-30 DIAGNOSIS — I4891 Unspecified atrial fibrillation: Secondary | ICD-10-CM | POA: Diagnosis not present

## 2020-05-30 NOTE — Patient Instructions (Signed)
Medication Instructions:  No changes  If you need a refill on your cardiac medications before your next appointment, please call your pharmacy.    Lab work: No new labs needed   If you have labs (blood work) drawn today and your tests are completely normal, you will receive your results only by: . MyChart Message (if you have MyChart) OR . A paper copy in the mail If you have any lab test that is abnormal or we need to change your treatment, we will call you to review the results.   Testing/Procedures: No new testing needed   Follow-Up: At [facility], you and your health needs are our priority.  As part of our continuing mission to provide you with exceptional heart care, we have created designated Provider Care Teams.  These Care Teams include your primary Cardiologist (physician) and Advanced Practice Providers (APPs -  Physician Assistants and Nurse Practitioners) who all work together to provide you with the care you need, when you need it.  . You will need a follow up appointment in 12 months  . Providers on your designated Care Team:   . Christopher Berge, NP . Ryan Dunn, PA-C . Jacquelyn Visser, PA-C  Any Other Special Instructions Will Be Listed Below (If Applicable).  COVID-19 Vaccine Information can be found at: [email or web address] For questions related to vaccine distribution or appointments, please email [email or web address] or call [phone].     

## 2020-06-19 DIAGNOSIS — X32XXXA Exposure to sunlight, initial encounter: Secondary | ICD-10-CM | POA: Diagnosis not present

## 2020-06-19 DIAGNOSIS — D2261 Melanocytic nevi of right upper limb, including shoulder: Secondary | ICD-10-CM | POA: Diagnosis not present

## 2020-06-19 DIAGNOSIS — D225 Melanocytic nevi of trunk: Secondary | ICD-10-CM | POA: Diagnosis not present

## 2020-06-19 DIAGNOSIS — D2262 Melanocytic nevi of left upper limb, including shoulder: Secondary | ICD-10-CM | POA: Diagnosis not present

## 2020-06-19 DIAGNOSIS — L570 Actinic keratosis: Secondary | ICD-10-CM | POA: Diagnosis not present

## 2020-06-19 DIAGNOSIS — D2271 Melanocytic nevi of right lower limb, including hip: Secondary | ICD-10-CM | POA: Diagnosis not present

## 2020-06-19 DIAGNOSIS — Z85828 Personal history of other malignant neoplasm of skin: Secondary | ICD-10-CM | POA: Diagnosis not present

## 2020-07-16 ENCOUNTER — Other Ambulatory Visit: Payer: Self-pay | Admitting: *Deleted

## 2020-07-16 NOTE — Progress Notes (Signed)
cbc

## 2020-07-17 ENCOUNTER — Other Ambulatory Visit: Payer: Self-pay

## 2020-07-17 ENCOUNTER — Inpatient Hospital Stay: Payer: Medicare HMO | Attending: Oncology

## 2020-07-17 LAB — CBC WITH DIFFERENTIAL/PLATELET
Abs Immature Granulocytes: 0.04 10*3/uL (ref 0.00–0.07)
Basophils Absolute: 0.1 10*3/uL (ref 0.0–0.1)
Basophils Relative: 1 %
Eosinophils Absolute: 0.2 10*3/uL (ref 0.0–0.5)
Eosinophils Relative: 4 %
HCT: 44.2 % (ref 39.0–52.0)
Hemoglobin: 15.1 g/dL (ref 13.0–17.0)
Immature Granulocytes: 1 %
Lymphocytes Relative: 28 %
Lymphs Abs: 1.7 10*3/uL (ref 0.7–4.0)
MCH: 33.9 pg (ref 26.0–34.0)
MCHC: 34.2 g/dL (ref 30.0–36.0)
MCV: 99.1 fL (ref 80.0–100.0)
Monocytes Absolute: 0.7 10*3/uL (ref 0.1–1.0)
Monocytes Relative: 11 %
Neutro Abs: 3.3 10*3/uL (ref 1.7–7.7)
Neutrophils Relative %: 55 %
Platelets: 159 10*3/uL (ref 150–400)
RBC: 4.46 MIL/uL (ref 4.22–5.81)
RDW: 11.8 % (ref 11.5–15.5)
WBC: 6 10*3/uL (ref 4.0–10.5)
nRBC: 0 % (ref 0.0–0.2)

## 2020-07-17 LAB — IRON AND TIBC
Iron: 243 ug/dL — ABNORMAL HIGH (ref 45–182)
Saturation Ratios: 72 % — ABNORMAL HIGH (ref 17.9–39.5)
TIBC: 339 ug/dL (ref 250–450)
UIBC: 96 ug/dL

## 2020-07-17 LAB — FERRITIN: Ferritin: 272 ng/mL (ref 24–336)

## 2020-07-18 ENCOUNTER — Inpatient Hospital Stay: Payer: Medicare HMO | Attending: Oncology | Admitting: Nurse Practitioner

## 2020-07-18 ENCOUNTER — Encounter: Payer: Self-pay | Admitting: Nurse Practitioner

## 2020-07-18 ENCOUNTER — Inpatient Hospital Stay: Payer: Medicare HMO

## 2020-07-18 DIAGNOSIS — I10 Essential (primary) hypertension: Secondary | ICD-10-CM | POA: Diagnosis not present

## 2020-07-18 DIAGNOSIS — I480 Paroxysmal atrial fibrillation: Secondary | ICD-10-CM | POA: Insufficient documentation

## 2020-07-18 DIAGNOSIS — E119 Type 2 diabetes mellitus without complications: Secondary | ICD-10-CM | POA: Diagnosis not present

## 2020-07-18 DIAGNOSIS — Z7901 Long term (current) use of anticoagulants: Secondary | ICD-10-CM | POA: Insufficient documentation

## 2020-07-18 NOTE — Progress Notes (Signed)
Patient tolerated 500cc phlebotomy well. Patient and VSS. Discharged home.

## 2020-07-18 NOTE — Progress Notes (Signed)
[place]  [phone] [phone]  ID: Collin Holt OB: 1960-09-14  MR#: [id number]  HKV#:[id number]  Patient Care Team: [name], MD as PCP - General (Internal Medicine) Rockey Situ Kathlene November, MD as PCP - Cardiology (Cardiology) [name], MD as Consulting Physician (Cardiology) [name], MD as Consulting Physician (Oncology)  CHIEF COMPLAINT:  Hereditary hemochromatosis   INTERVAL HISTORY: Patient returns to clinic for routine 6 month evaluation and consideration of contniuation of therapeutic phlebotomy for history of hereditary hemochromatosis. He continues to feel well and remains asymptomatic. Denies any neurologic complaints. Denies recent fevers or illnesses. Denies any easy bleeding or bruising. Reports good appetite and denies weight loss. Denies chest pain or shortness of breath. Denies any nausea, vomiting, constipation, or diarrhea. Denies urinary complaints. Patient offers no further specific complaints today.  REVIEW OF SYSTEMS:   Review of Systems  Constitutional: Negative.  Negative for fever, malaise/fatigue and weight loss.  Respiratory: Negative.  Negative for cough and shortness of breath.   Cardiovascular: Negative.  Negative for chest pain and leg swelling.  Gastrointestinal: Negative.  Negative for abdominal pain, blood in stool and melena.  Genitourinary: Negative.  Negative for dysuria.  Musculoskeletal: Negative.  Negative for back pain and myalgias.  Skin: Negative.  Negative for rash.  Neurological: Negative.  Negative for dizziness, weakness and headaches.  Psychiatric/Behavioral: Negative.  The patient is not nervous/anxious.   As per HPI. Otherwise, a complete review of systems is negative.  PAST MEDICAL HISTORY: Past Medical History:  Diagnosis Date   Chronic headaches    Diabetes mellitus without complication ([place])    DOE (dyspnea on exertion)    a. 02/2010 Cath: nl cors;  b. 02/2010  CPX: good fxnl capacity.   HTN (hypertension)    OSA (obstructive sleep apnea)    PAF (paroxysmal atrial fibrillation) ([place])    a. 2012 Echo: nl LV fxn, mod dil LA.   Sleep apnea     PAST SURGICAL HISTORY: Past Surgical History:  Procedure Laterality Date   SHOULDER SURGERY     Right x 2    FAMILY HISTORY Family History  Problem Relation Age of Onset   Emphysema Father    Cancer Father    Prostate cancer Neg Hx    Colon cancer Neg Hx    Coronary artery disease Neg Hx    Diabetes Neg Hx      ADVANCED DIRECTIVES:    HEALTH MAINTENANCE: Social History   Tobacco Use   Smoking status: Former    Packs/day: 1.00    Years: 10.00    Pack years: 10.00    Types: Cigarettes    Quit date: 01/19/1999    Years since quitting: 21.5   Smokeless tobacco: Former    Quit date: 01/19/2011  Vaping Use   Vaping Use: Never used  Substance Use Topics   Alcohol use: Yes    Alcohol/week: 4.0 standard drinks    Types: 4 Glasses of wine per week    Comment: daily   Drug use: No     Allergies  Allergen Reactions   Morphine And Related Nausea And Vomiting   Terfenadine Other (See Comments)    Irregular heartbeat Other reaction(s): GI Upset (intolerance)    Current Outpatient Medications  Medication Sig Dispense Refill   cyanocobalamin (,VITAMIN B-12,) 1000 MCG/ML injection Inject 1,000 mcg into the muscle every 30 (thirty) days.     metoprolol succinate (TOPROL-XL) 50 MG 24 hr tablet Take 1  tablet (50 mg total) by mouth daily. Take with or immediately following a meal. 90 tablet 3   rivaroxaban (XARELTO) 20 MG TABS tablet Take 1 tablet (20 mg total) by mouth daily with supper. 90 tablet 1   SUMAtriptan (IMITREX) 100 MG tablet Take 100 mg by mouth as needed.      tadalafil (CIALIS) 20 MG tablet Take 20 mg by mouth daily as needed.     venlafaxine (EFFEXOR) 75 MG tablet Take 75 mg by mouth daily.     No current facility-administered medications for this visit.     OBJECTIVE: Vitals:   07/18/20 1035  BP: (!) 144/78  Pulse: (!) 52  Resp: 16  Temp: (!) 96.6 F (35.9 C)     Body mass index is 38.31 kg/m.    ECOG FS:0 - Asymptomatic  General: Well-developed, well-nourished, no acute distress. Eyes: Pink conjunctiva, anicteric sclera. Lungs: Clear to auscultation bilaterally.  No audible wheezing or coughing Heart: Regular rate and rhythm.  Abdomen: Soft, nontender, nondistended.  Musculoskeletal: No edema, cyanosis, or clubbing. Neuro: Alert, answering all questions appropriately. Cranial nerves grossly intact. Skin: No rashes or petechiae noted. Psych: Normal affect.   LAB RESULTS:  Lab Results  Component Value Date   NA 136 01/16/2020   K 4.0 01/16/2020   CL 103 01/16/2020   CO2 22 01/16/2020   GLUCOSE 100 (H) 01/16/2020   BUN 12 01/16/2020   CREATININE 0.87 01/16/2020   CALCIUM 8.9 01/16/2020   PROT 7.3 01/16/2020   ALBUMIN 4.0 01/16/2020   AST 52 (H) 01/16/2020   ALT 66 (H) 01/16/2020   ALKPHOS 31 (L) 01/16/2020   BILITOT 1.2 01/16/2020   GFRNONAA >60 01/16/2020   GFRAA 98 02/28/2019    Lab Results  Component Value Date   WBC 6.0 07/17/2020   NEUTROABS 3.3 07/17/2020   HGB 15.1 07/17/2020   HCT 44.2 07/17/2020   MCV 99.1 07/17/2020   PLT 159 07/17/2020   Lab Results  Component Value Date   FERRITIN 272 07/17/2020     STUDIES: No results found.  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  PLAN:    1. Hereditary hemochromatosis: Patient's hemoglobin and iron stores continue to be within normal limits. Ferritin elevated but improved to previous. Goal ferritin is 50-100. Proceed with 500 ml phlebotomy today. Liver u/s reviewed today with evidence of cirrhosis. Will check afp at next visit for [facility] screening. RTC in 2 months for phlebotomy. In 4 months lab (cbc, ferritin, iron studies, afp), then day or 2 later, MD/[place], possible phlebotomy.  2. Atrial fibrillation:  Continue Xarelto as ordered. 3. Hypertension: Chronic and unchanged.  Continue follow-up and treatment per primary care. 4.  Elevated liver enzymes in setting of cirrhosis: resolved. Managed by GI.   Patient expressed understanding that he can call or return to clinic at any time if he has any questions, concerns, or complaints.  Verlon Au, NP   07/18/2020 11:01 AM

## 2020-07-22 ENCOUNTER — Encounter: Payer: Self-pay | Admitting: Internal Medicine

## 2020-07-23 ENCOUNTER — Ambulatory Visit: Payer: Medicare HMO | Admitting: Certified Registered"

## 2020-07-23 ENCOUNTER — Encounter: Payer: Self-pay | Admitting: Internal Medicine

## 2020-07-23 ENCOUNTER — Ambulatory Visit
Admission: RE | Admit: 2020-07-23 | Discharge: 2020-07-23 | Disposition: A | Discharge status: Home / Self Care | Payer: Medicare HMO | Attending: Internal Medicine | Admitting: Internal Medicine

## 2020-07-23 ENCOUNTER — Other Ambulatory Visit: Payer: Self-pay

## 2020-07-23 ENCOUNTER — Encounter
Admission: RE | Disposition: A | Discharge status: Home / Self Care | Payer: Self-pay | Source: Home / Self Care | Attending: Internal Medicine

## 2020-07-23 DIAGNOSIS — Z1211 Encounter for screening for malignant neoplasm of colon: Secondary | ICD-10-CM | POA: Insufficient documentation

## 2020-07-23 DIAGNOSIS — K635 Polyp of colon: Secondary | ICD-10-CM | POA: Insufficient documentation

## 2020-07-23 DIAGNOSIS — Z7901 Long term (current) use of anticoagulants: Secondary | ICD-10-CM | POA: Insufficient documentation

## 2020-07-23 DIAGNOSIS — K621 Rectal polyp: Secondary | ICD-10-CM | POA: Diagnosis not present

## 2020-07-23 DIAGNOSIS — D126 Benign neoplasm of colon, unspecified: Secondary | ICD-10-CM | POA: Diagnosis not present

## 2020-07-23 DIAGNOSIS — Z79899 Other long term (current) drug therapy: Secondary | ICD-10-CM | POA: Insufficient documentation

## 2020-07-23 DIAGNOSIS — K5730 Diverticulosis of large intestine without perforation or abscess without bleeding: Secondary | ICD-10-CM | POA: Diagnosis not present

## 2020-07-23 DIAGNOSIS — Z885 Allergy status to narcotic agent status: Secondary | ICD-10-CM | POA: Diagnosis not present

## 2020-07-23 DIAGNOSIS — F419 Anxiety disorder, unspecified: Secondary | ICD-10-CM | POA: Diagnosis not present

## 2020-07-23 HISTORY — DX: Plantar fascial fibromatosis: M72.2

## 2020-07-23 HISTORY — DX: Pneumonia, unspecified organism: J18.9

## 2020-07-23 HISTORY — DX: Allergic rhinitis, unspecified: J30.9

## 2020-07-23 HISTORY — DX: Benign paroxysmal vertigo, unspecified ear: H81.10

## 2020-07-23 HISTORY — DX: Deficiency of other specified B group vitamins: E53.8

## 2020-07-23 HISTORY — DX: Cardiac arrhythmia, unspecified: I49.9

## 2020-07-23 HISTORY — PX: COLONOSCOPY WITH PROPOFOL: SHX5780

## 2020-07-23 HISTORY — DX: Tinnitus, unspecified ear: H93.19

## 2020-07-23 HISTORY — DX: Depression, unspecified: F32.A

## 2020-07-23 HISTORY — DX: Anxiety disorder, unspecified: F41.9

## 2020-07-23 HISTORY — DX: Hereditary hemochromatosis: E83.110

## 2020-07-23 LAB — GLUCOSE, CAPILLARY: Glucose-Capillary: 141 mg/dL — ABNORMAL HIGH (ref 70–99)

## 2020-07-23 SURGERY — COLONOSCOPY WITH PROPOFOL
Anesthesia: General

## 2020-07-23 MED ORDER — GLYCOPYRROLATE 0.2 MG/ML IJ SOLN
INTRAMUSCULAR | Status: DC | PRN
  Administered 2020-07-23: .2 mg via INTRAVENOUS

## 2020-07-23 MED ORDER — LIDOCAINE HCL (CARDIAC) PF 100 MG/5ML IV SOSY
PREFILLED_SYRINGE | INTRAVENOUS | Status: DC | PRN
  Administered 2020-07-23: 50 mg via INTRAVENOUS

## 2020-07-23 MED ORDER — SODIUM CHLORIDE 0.9 % IV SOLN: INTRAVENOUS | Status: DC

## 2020-07-23 MED ORDER — PROPOFOL 500 MG/50ML IV EMUL
INTRAVENOUS | Status: DC | PRN
  Administered 2020-07-23: 150 ug/kg/min via INTRAVENOUS

## 2020-07-23 MED ORDER — PROPOFOL 10 MG/ML IV BOLUS
INTRAVENOUS | Status: DC | PRN
  Administered 2020-07-23: 30 mg via INTRAVENOUS
  Administered 2020-07-23: 100 mg via INTRAVENOUS

## 2020-07-23 MED ORDER — DEXMEDETOMIDINE (PRECEDEX) IN NS 20 MCG/5ML (4 MCG/ML) IV SYRINGE
PREFILLED_SYRINGE | INTRAVENOUS | Status: DC | PRN
  Administered 2020-07-23: 12 ug via INTRAVENOUS

## 2020-07-23 NOTE — Transfer of Care (Signed)
Immediate Anesthesia Transfer of Care Note  Patient: Collin Holt  Procedure(s) Performed: COLONOSCOPY WITH PROPOFOL  Patient Location: PACU  Anesthesia Type:General  Level of Consciousness: awake  Airway & Oxygen Therapy: Patient Spontanous Breathing  Post-op Assessment: Report given to RN and Post -op Vital signs reviewed and stable  Post vital signs: stable  Last Vitals:  Vitals Value Taken Time  BP    Temp    Pulse 80 07/23/20 0925  Resp 17 07/23/20 0925  SpO2 99 % 07/23/20 0925  Vitals shown include unvalidated device data.  Last Pain:  Vitals:   07/23/20 0806  TempSrc: Temporal  PainSc: 0-No pain         Complications: No notable events documented.

## 2020-07-23 NOTE — Interval H&P Note (Signed)
History and Physical Interval Note:  07/23/2020 9:01 AM  Collin Holt  has presented today for surgery, with the diagnosis of screening.  The various methods of treatment have been discussed with the patient and family. After consideration of risks, benefits and other options for treatment, the patient has consented to  Procedure(s): COLONOSCOPY WITH PROPOFOL (N/A) as a surgical intervention.  The patient's history has been reviewed, patient examined, no change in status, stable for surgery.  I have reviewed the patient's chart and labs.  Questions were answered to the patient's satisfaction.     [place], [place]

## 2020-07-23 NOTE — Op Note (Signed)
[facility] Patient Name: Collin Holt Procedure Date: 07/23/2020 9:00 AM MRN: [id number] Account #: [id number] Date of Birth: 18-Apr-1960 Admit Type: Outpatient Age: 60 Room: [facility] ENDO ROOM 2 Gender: Male Note Status: Finalized Procedure:             Colonoscopy Indications:           Screening for colorectal malignant neoplasm Providers:             Benay Pike. Odilon Cass MD, MD Medicines:             Propofol per Anesthesia Complications:         No immediate complications. Procedure:             Pre-Anesthesia Assessment:                        - The risks and benefits of the procedure and the                         sedation options and risks were discussed with the                         patient. All questions were answered and informed                         consent was obtained.                        - The risks and benefits of the procedure and the                         sedation options and risks were discussed with the                         patient. All questions were answered and informed                         consent was obtained.                        - Patient identification and proposed procedure were                         verified prior to the procedure by the nurse. The                         procedure was verified in the procedure room.                        - ASA Grade Assessment: III - A patient with severe                         systemic disease.                        - After reviewing the risks and benefits, the patient                         was deemed in satisfactory condition to undergo the  procedure.                        After obtaining informed consent, the colonoscope was                         passed under direct vision. Throughout the procedure,                         the patient's blood pressure, pulse, and oxygen                         saturations were monitored continuously.  The                         Colonoscope was introduced through the anus and                         advanced to the the cecum, identified by appendiceal                         orifice and ileocecal valve. The colonoscopy was                         somewhat difficult due to significant looping.                         Successful completion of the procedure was aided by                         applying abdominal pressure. The patient tolerated the                         procedure well. The quality of the bowel preparation                         was good. The ileocecal valve, appendiceal orifice,                         and rectum were photographed. Findings:      The perianal and digital rectal examinations were normal. Pertinent       negatives include normal sphincter tone and no palpable rectal lesions.      Three sessile polyps were found in the rectum. The polyps were 3 to 5 mm       in size. These polyps were removed with a jumbo cold forceps. Resection       and retrieval were complete.      A 5 mm polyp was found in the descending colon. The polyp was sessile.       The polyp was removed with a jumbo cold forceps. Resection and retrieval       were complete.      A few medium-mouthed diverticula were found in the sigmoid colon.      The exam was otherwise without abnormality. Impression:            - Three 3 to 5 mm polyps in the rectum, removed with a  jumbo cold forceps. Resected and retrieved.                        - One 5 mm polyp in the descending colon, removed with                         a jumbo cold forceps. Resected and retrieved.                        - Diverticulosis in the sigmoid colon.                        - The examination was otherwise normal. Recommendation:        - Patient has a contact number available for                         emergencies. The signs and symptoms of potential                         delayed complications were  discussed with the patient.                         Return to normal activities tomorrow. Written                         discharge instructions were provided to the patient.                        - Resume previous diet.                        - Continue present medications.                        - Repeat colonoscopy is recommended for surveillance.                         The colonoscopy date will be determined after                         pathology results from today's exam become available                         for review.                        - Return to GI office PRN.                        - The findings and recommendations were discussed with                         the patient. Procedure Code(s):     --- Professional ---                        [phone], Colonoscopy, flexible; with biopsy, single or                         multiple Diagnosis Code(s):     --- Professional ---  K57.30, Diverticulosis of large intestine without                         perforation or abscess without bleeding                        K63.5, Polyp of colon                        K62.1, Rectal polyp                        Z12.11, Encounter for screening for malignant neoplasm                         of colon CPT copyright 2019 American Medical Association. All rights reserved. The codes documented in this report are preliminary and upon coder review may  be revised to meet current compliance requirements. Efrain Sella MD, MD 07/23/2020 9:25:06 AM This report has been signed electronically. Number of Addenda: 0 Note Initiated On: 07/23/2020 9:00 AM Scope Withdrawal Time: 0 hours 7 minutes 7 seconds  Total Procedure Duration: 0 hours 15 minutes 16 seconds  Estimated Blood Loss:  Estimated blood loss: none.      [facility]

## 2020-07-23 NOTE — Anesthesia Postprocedure Evaluation (Signed)
Anesthesia Post Note  Patient: Collin Holt  Procedure(s) Performed: COLONOSCOPY WITH PROPOFOL  Patient location during evaluation: Endoscopy Anesthesia Type: General Level of consciousness: awake and alert Pain management: pain level controlled Vital Signs Assessment: post-procedure vital signs reviewed and stable Respiratory status: spontaneous breathing, nonlabored ventilation, respiratory function stable and patient connected to nasal cannula oxygen Cardiovascular status: blood pressure returned to baseline and stable Postop Assessment: no apparent nausea or vomiting Anesthetic complications: no   No notable events documented.   Last Vitals:  Vitals:   07/23/20 0947 07/23/20 0957  BP: 137/84 133/84  Pulse:    Resp:    Temp:    SpO2:      Last Pain:  Vitals:   07/23/20 0957  TempSrc:   PainSc: 0-No pain                 Martha Clan

## 2020-07-23 NOTE — H&P (Signed)
Outpatient short stay form Pre-procedure 07/23/2020 9:00 AM Collin Holt K. Alice Reichert, M.D.  Primary Physician: Fulton Reek, M.D.  Reason for visit:  Colon cancer screening  History of present illness:  Patient presents for colonoscopy for colon cancer screening. The patient denies complaints of abdominal pain, significant change in bowel habits, or rectal bleeding.      Current Facility-Administered Medications:    0.9 %  sodium chloride infusion, , Intravenous, Continuous, [place], Benay Pike, MD, Last Rate: 20 mL/hr at 07/23/20 8338, Continued from Pre-op at 07/23/20 0859  Medications Prior to Admission  Medication Sig Dispense Refill Last Dose   azelastine (ASTELIN) 0.1 % nasal spray Place into both nostrils 2 (two) times daily. Use in each nostril as directed   Past Week   cyanocobalamin (,VITAMIN B-12,) 1000 MCG/ML injection Inject 1,000 mcg into the muscle every 30 (thirty) days.   Past Month   metoprolol succinate (TOPROL-XL) 50 MG 24 hr tablet Take 1 tablet (50 mg total) by mouth daily. Take with or immediately following a meal. 90 tablet 3 07/22/2020   ondansetron (ZOFRAN-ODT) 4 MG disintegrating tablet Take 4 mg by mouth every 8 (eight) hours as needed for nausea or vomiting.      SUMAtriptan (IMITREX) 100 MG tablet Take 100 mg by mouth as needed.    Past Week   venlafaxine (EFFEXOR) 75 MG tablet Take 75 mg by mouth daily.   07/22/2020   rivaroxaban (XARELTO) 20 MG TABS tablet Take 1 tablet (20 mg total) by mouth daily with supper. 90 tablet 1 07/21/2020   tadalafil (CIALIS) 20 MG tablet Take 20 mg by mouth daily as needed.        Allergies  Allergen Reactions   Morphine And Related Nausea And Vomiting   Terfenadine Other (See Comments)    Irregular heartbeat Other reaction(s): GI Upset (intolerance)     Past Medical History:  Diagnosis Date   Allergic rhinitis    Anxiety    B12 deficiency    Benign paroxysmal vertigo    Chronic headaches    Depression    Diabetes mellitus  without complication ([facility])    DOE (dyspnea on exertion)    a. 02/2010 Cath: nl cors;  b. 02/2010 CPX: good fxnl capacity.   Dysrhythmia    Hereditary hemochromatosis ([place])    HTN (hypertension)    OSA (obstructive sleep apnea)    PAF (paroxysmal atrial fibrillation) ([place])    a. 2012 Echo: nl LV fxn, mod dil LA.   Plantar fibromatosis    Pneumonia    Sleep apnea    Tinnitus     Review of systems:  Otherwise negative.    Physical Exam  Gen: Alert, oriented. Appears stated age.  HEENT: [place]/AT. PERRLA. Lungs: CTA, no wheezes. CV: RR nl S1, S2. Abd: soft, benign, no masses. BS+ Ext: No edema. Pulses 2+    Planned procedures: Proceed with colonoscopy. The patient understands the nature of the planned procedure, indications, risks, alternatives and potential complications including but not limited to bleeding, infection, perforation, damage to internal organs and possible oversedation/side effects from anesthesia. The patient agrees and gives consent to proceed.  Please refer to procedure notes for findings, recommendations and patient disposition/instructions.     Collin Holt K. Alice Reichert, M.D. Gastroenterology 07/23/2020  9:00 AM

## 2020-07-23 NOTE — Anesthesia Preprocedure Evaluation (Signed)
Anesthesia Evaluation  Patient identified by MRN, date of birth, ID band Patient awake    Reviewed: Allergy & Precautions, H&P , NPO status , Patient's Chart, lab work & pertinent test results, reviewed documented beta blocker date and time   History of Anesthesia Complications Negative for: history of anesthetic complications  Airway Mallampati: II  TM Distance: >3 FB Neck ROM: full    Dental  (+) Dental Advidsory Given, Teeth Intact, Missing   Pulmonary neg shortness of breath, sleep apnea and Continuous Positive Airway Pressure Ventilation , neg COPD, neg recent URI, former smoker,    Pulmonary exam normal breath sounds clear to auscultation       Cardiovascular Exercise Tolerance: Good hypertension, (-) angina(-) Past MI and (-) Cardiac Stents + dysrhythmias Atrial Fibrillation (-) Valvular Problems/Murmurs Rhythm:regular Rate:Normal     Neuro/Psych PSYCHIATRIC DISORDERS Anxiety Depression negative neurological ROS     GI/Hepatic negative GI ROS, Hereditary hemochromatosis, NAFLD   Endo/Other  diabetes, Well Controlled  Renal/GU negative Renal ROS  negative genitourinary   Musculoskeletal   Abdominal   Peds  Hematology negative hematology ROS (+)   Anesthesia Other Findings Past Medical History: No date: Allergic rhinitis No date: Anxiety No date: B12 deficiency No date: Benign paroxysmal vertigo No date: Chronic headaches No date: Depression No date: Diabetes mellitus without complication ([facility]) No date: DOE (dyspnea on exertion)     Comment:  a. 02/2010 Cath: nl cors;  b. 02/2010 CPX: good fxnl               capacity. No date: Dysrhythmia No date: Hereditary hemochromatosis ([place]) No date: HTN (hypertension) No date: OSA (obstructive sleep apnea) No date: PAF (paroxysmal atrial fibrillation) ([place])     Comment:  a. 2012 Echo: nl LV fxn, mod dil LA. 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 No date: Pneumonia No  date: Sleep apnea No date: Tinnitus   Reproductive/Obstetrics negative OB ROS                             Anesthesia Physical Anesthesia Plan  ASA: 3  Anesthesia Plan: General   Post-op Pain Management:    Induction: Intravenous  PONV Risk Score and Plan: 2 and TIVA and Propofol infusion  Airway Management Planned: Natural Airway and Nasal Cannula  Additional Equipment:   Intra-op Plan:   Post-operative Plan:   Informed Consent: I have reviewed the patients History and Physical, chart, labs and discussed the procedure including the risks, benefits and alternatives for the proposed anesthesia with the patient or authorized representative who has indicated his/her understanding and acceptance.     Dental Advisory Given  Plan Discussed with: Anesthesiologist, CRNA and Surgeon  Anesthesia Plan Comments:         Anesthesia Quick Evaluation

## 2020-07-24 ENCOUNTER — Encounter: Payer: Self-pay | Admitting: Internal Medicine

## 2020-07-24 LAB — SURGICAL PATHOLOGY

## 2020-08-15 ENCOUNTER — Other Ambulatory Visit: Payer: Self-pay | Admitting: Cardiovascular Disease

## 2020-08-15 DIAGNOSIS — I480 Paroxysmal atrial fibrillation: Secondary | ICD-10-CM

## 2020-08-15 DIAGNOSIS — Z7901 Long term (current) use of anticoagulants: Secondary | ICD-10-CM

## 2020-08-15 NOTE — Telephone Encounter (Signed)
Please review for refill, Thanks !  

## 2020-08-15 NOTE — Telephone Encounter (Signed)
Pt's age 60, wt 128.1 kg, SCr 1.0, CrCl 144.11, last ov w/ TG 05/30/20.

## 2020-09-04 DIAGNOSIS — G4733 Obstructive sleep apnea (adult) (pediatric): Secondary | ICD-10-CM | POA: Diagnosis not present

## 2020-09-04 DIAGNOSIS — Z79899 Other long term (current) drug therapy: Secondary | ICD-10-CM | POA: Diagnosis not present

## 2020-09-04 DIAGNOSIS — I480 Paroxysmal atrial fibrillation: Secondary | ICD-10-CM | POA: Diagnosis not present

## 2020-09-04 DIAGNOSIS — Z125 Encounter for screening for malignant neoplasm of prostate: Secondary | ICD-10-CM | POA: Diagnosis not present

## 2020-09-04 DIAGNOSIS — I10 Essential (primary) hypertension: Secondary | ICD-10-CM | POA: Diagnosis not present

## 2020-09-04 DIAGNOSIS — Z0000 Encounter for general adult medical examination without abnormal findings: Secondary | ICD-10-CM | POA: Diagnosis not present

## 2020-09-11 DIAGNOSIS — Z79899 Other long term (current) drug therapy: Secondary | ICD-10-CM | POA: Diagnosis not present

## 2020-09-11 DIAGNOSIS — R7309 Other abnormal glucose: Secondary | ICD-10-CM | POA: Diagnosis not present

## 2020-09-11 DIAGNOSIS — E782 Mixed hyperlipidemia: Secondary | ICD-10-CM | POA: Diagnosis not present

## 2020-09-11 DIAGNOSIS — Z125 Encounter for screening for malignant neoplasm of prostate: Secondary | ICD-10-CM | POA: Diagnosis not present

## 2020-09-11 DIAGNOSIS — I10 Essential (primary) hypertension: Secondary | ICD-10-CM | POA: Diagnosis not present

## 2020-09-18 ENCOUNTER — Inpatient Hospital Stay: Payer: Medicare HMO | Attending: Oncology

## 2020-10-08 ENCOUNTER — Other Ambulatory Visit: Payer: Self-pay | Admitting: Cardiovascular Disease

## 2020-10-08 DIAGNOSIS — I480 Paroxysmal atrial fibrillation: Secondary | ICD-10-CM

## 2020-10-21 ENCOUNTER — Inpatient Hospital Stay: Payer: Medicare HMO | Attending: Oncology

## 2020-10-21 ENCOUNTER — Other Ambulatory Visit: Payer: Self-pay

## 2020-10-21 NOTE — Progress Notes (Signed)
500 ml blood removed for therapeutic phlebotomy today. Tolerated well. VSS. Discharged to home. Pt feeling well.

## 2020-10-21 NOTE — Patient Instructions (Signed)

## 2020-11-14 ENCOUNTER — Telehealth: Payer: Self-pay | Admitting: Oncology

## 2020-11-14 NOTE — Progress Notes (Deleted)
[name]  Telephone:(336) [phone] Fax:(336) [phone]  ID: Collin Holt OB: 1960/07/21  MR#: [id number]  YQI#:[id number]  Patient Care Team: [name], MD as PCP - General (Internal Medicine) [name], MD as PCP - Cardiology (Cardiology) [name], MD as Consulting Physician (Cardiology) [name], MD as Consulting Physician (Oncology)  CHIEF COMPLAINT:  Hereditary hemochromatosis   INTERVAL HISTORY: Patient returns to clinic today for routine 62-month evaluation and continuation of phlebotomy.  He continues to feel well and remains asymptomatic. He has no neurologic complaints.  He denies any weakness or fatigue.  He denies any recent fevers or illnesses. He has a good appetite and denies weight loss.  He has no chest pain, shortness of breath, cough, or hemoptysis.  He denies any nausea, vomiting, constipation, or diarrhea. He has no urinary complaints.  Patient offers no specific complaints today.  REVIEW OF SYSTEMS:   Review of Systems  Constitutional: Negative.  Negative for fever, malaise/fatigue and weight loss.  Respiratory: Negative.  Negative for cough and shortness of breath.   Cardiovascular: Negative.  Negative for chest pain and leg swelling.  Gastrointestinal: Negative.  Negative for abdominal pain, blood in stool and melena.  Genitourinary: Negative.  Negative for dysuria.  Musculoskeletal: Negative.  Negative for back pain and myalgias.  Skin: Negative.  Negative for rash.  Neurological: Negative.  Negative for dizziness, weakness and headaches.  Psychiatric/Behavioral: Negative.  The patient is not nervous/anxious.    As per HPI. Otherwise, a complete review of systems is negative.  PAST MEDICAL HISTORY: Past Medical History:  Diagnosis Date   Allergic rhinitis    Anxiety    B12 deficiency    Benign paroxysmal vertigo    Chronic headaches    Depression    Diabetes mellitus without complication ([place])     DOE (dyspnea on exertion)    a. 02/2010 Cath: nl cors;  b. 02/2010 CPX: good fxnl capacity.   Dysrhythmia    Hereditary hemochromatosis ([name])    HTN (hypertension)    OSA (obstructive sleep apnea)    PAF (paroxysmal atrial fibrillation) ([name])    a. 2012 Echo: nl LV fxn, mod dil LA.   Plantar fibromatosis    Pneumonia    Sleep apnea    Tinnitus     PAST SURGICAL HISTORY: Past Surgical History:  Procedure Laterality Date   COLONOSCOPY WITH PROPOFOL N/A 07/23/2020   Procedure: COLONOSCOPY WITH PROPOFOL;  Surgeon: Toledo, Benay Pike, MD;  Location: [facility] ENDOSCOPY;  Service: Gastroenterology;  Laterality: N/A;   SHOULDER SURGERY     Right x 2    FAMILY HISTORY Family History  Problem Relation Age of Onset   Emphysema Father    Cancer Father    Prostate cancer Neg Hx    Colon cancer Neg Hx    Coronary artery disease Neg Hx    Diabetes Neg Hx        ADVANCED DIRECTIVES:    HEALTH MAINTENANCE: Social History   Tobacco Use   Smoking status: Former    Packs/day: 1.00    Years: 10.00    Pack years: 10.00    Types: Cigarettes    Quit date: 01/19/1999    Years since quitting: 21.8   Smokeless tobacco: Former    Quit date: 01/19/2011  Vaping Use   Vaping Use: Never used  Substance Use Topics   Alcohol use: Yes    Alcohol/week: 4.0 standard drinks    Types: 4 Glasses of  wine per week    Comment: daily   Drug use: No     Allergies  Allergen Reactions   Morphine And Related Nausea And Vomiting   Terfenadine Other (See Comments)    Irregular heartbeat Other reaction(s): GI Upset (intolerance)    Current Outpatient Medications  Medication Sig Dispense Refill   azelastine (ASTELIN) 0.1 % nasal spray Place into both nostrils 2 (two) times daily. Use in each nostril as directed     cyanocobalamin (,VITAMIN B-12,) 1000 MCG/ML injection Inject 1,000 mcg into the muscle every 30 (thirty) days.     metoprolol succinate (TOPROL-XL) 50 MG 24 hr tablet TAKE 1 TABLET BY MOUTH ONCE  DAILY WITH  OR  IMMEDIATELY  FOLLOWING  A  MEAL 90 tablet 0   ondansetron (ZOFRAN-ODT) 4 MG disintegrating tablet Take 4 mg by mouth every 8 (eight) hours as needed for nausea or vomiting.     rivaroxaban (XARELTO) 20 MG TABS tablet TAKE ONE TABLET BY MOUTH ONE TIME DAILY with supper 90 tablet 1   SUMAtriptan (IMITREX) 100 MG tablet Take 100 mg by mouth as needed.      tadalafil (CIALIS) 20 MG tablet Take 20 mg by mouth daily as needed.     venlafaxine (EFFEXOR) 75 MG tablet Take 75 mg by mouth daily.     No current facility-administered medications for this visit.    OBJECTIVE: There were no vitals filed for this visit.    There is no height or weight on file to calculate BMI.    ECOG FS:0 - Asymptomatic  General: Well-developed, well-nourished, no acute distress. Eyes: Pink conjunctiva, anicteric sclera. HEENT: Normocephalic, moist mucous membranes. Lungs: No audible wheezing or coughing. Heart: Regular rate and rhythm. Abdomen: Soft, nontender, no obvious distention. Musculoskeletal: No edema, cyanosis, or clubbing. Neuro: Alert, answering all questions appropriately. Cranial nerves grossly intact. Skin: No rashes or petechiae noted. Psych: Normal affect.  LAB RESULTS:  Lab Results  Component Value Date   NA 136 01/16/2020   K 4.0 01/16/2020   CL 103 01/16/2020   CO2 22 01/16/2020   GLUCOSE 100 (H) 01/16/2020   BUN 12 01/16/2020   CREATININE 0.87 01/16/2020   CALCIUM 8.9 01/16/2020   PROT 7.3 01/16/2020   ALBUMIN 4.0 01/16/2020   AST 52 (H) 01/16/2020   ALT 66 (H) 01/16/2020   ALKPHOS 31 (L) 01/16/2020   BILITOT 1.2 01/16/2020   GFRNONAA >60 01/16/2020   GFRAA 98 02/28/2019    Lab Results  Component Value Date   WBC 6.0 07/17/2020   NEUTROABS 3.3 07/17/2020   HGB 15.1 07/17/2020   HCT 44.2 07/17/2020   MCV 99.1 07/17/2020   PLT 159 07/17/2020   Lab Results  Component Value Date   FERRITIN 272 07/17/2020     STUDIES: No results found.  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  PLAN:    1. Hereditary hemochromatosis: Patient's hemoglobin and iron stores continue to be within normal limits.  His most recent ferritin is 425.  Goal ferritin is 50-100.  Proceed with 500 mL phlebotomy next week.  Patient will then return to clinic in 2 and 4 months for phlebotomy only and then in 6 months with repeat laboratory work, further evaluation, and continuation of treatment if needed. 2. Atrial fibrillation: Continue Xarelto as ordered. 3. Hypertension: Chronic and unchanged.  Continue follow-up and treatment per primary care. 4.  Elevated liver enzymes: Slightly improved.  Monitor.  Patient expressed understanding that he  can call or return to clinic at any time if he has any questions, concerns, or complaints.  [name], MD   11/14/2020 10:02 AM

## 2020-11-14 NOTE — Telephone Encounter (Signed)
Pt called to cancel appt for 11-2 and 11-3. Pt will be out of town. Please call back at [phone]

## 2020-11-17 ENCOUNTER — Telehealth: Payer: Self-pay | Admitting: Oncology

## 2020-11-17 NOTE — Telephone Encounter (Signed)
Pt  called to cancel appt. Please call to reschedule at [phone]

## 2020-11-19 ENCOUNTER — Inpatient Hospital Stay: Payer: Medicare HMO

## 2020-11-20 ENCOUNTER — Inpatient Hospital Stay: Payer: Medicare HMO | Admitting: Oncology

## 2020-11-20 ENCOUNTER — Inpatient Hospital Stay: Payer: Medicare HMO

## 2020-11-27 NOTE — Progress Notes (Signed)
[place]  Telephone:(336) [phone] Fax:(336) [phone]  ID: Collin Holt OB: 1960-10-09  MR#: [id number]  SWF#:[id number]  Patient Care Team: [name], MD as PCP - General (Internal Medicine) Rockey Situ Kathlene November, MD as PCP - Cardiology (Cardiology) [name], MD as Consulting Physician (Cardiology) [name], MD as Consulting Physician (Oncology)  CHIEF COMPLAINT:  Hereditary hemochromatosis   INTERVAL HISTORY: Patient returns to clinic today for repeat laboratory work, further evaluation, and continuation of phlebotomy.  He continues to feel well and remains asymptomatic.  He has no neurologic complaints.  He denies any weakness or fatigue.  He denies any recent fevers or illnesses. He has a good appetite and denies weight loss.  He has no chest pain, shortness of breath, cough, or hemoptysis.  He denies any nausea, vomiting, constipation, or diarrhea. He has no urinary complaints.  Patient feels at his baseline offers no specific complaints today.  REVIEW OF SYSTEMS:   Review of Systems  Constitutional: Negative.  Negative for fever, malaise/fatigue and weight loss.  Respiratory: Negative.  Negative for cough and shortness of breath.   Cardiovascular: Negative.  Negative for chest pain and leg swelling.  Gastrointestinal: Negative.  Negative for abdominal pain, blood in stool and melena.  Genitourinary: Negative.  Negative for dysuria.  Musculoskeletal: Negative.  Negative for back pain and myalgias.  Skin: Negative.  Negative for rash.  Neurological: Negative.  Negative for dizziness, weakness and headaches.  Psychiatric/Behavioral: Negative.  The patient is not nervous/anxious.    As per HPI. Otherwise, a complete review of systems is negative.  PAST MEDICAL HISTORY: Past Medical History:  Diagnosis Date   Allergic rhinitis    Anxiety    B12 deficiency    Benign paroxysmal vertigo    Chronic headaches    Depression    Diabetes  mellitus without complication ([place])    DOE (dyspnea on exertion)    a. 02/2010 Cath: nl cors;  b. 02/2010 CPX: good fxnl capacity.   Dysrhythmia    Hereditary hemochromatosis ([place])    HTN (hypertension)    OSA (obstructive sleep apnea)    PAF (paroxysmal atrial fibrillation) ([place])    a. 2012 Echo: nl LV fxn, mod dil LA.   Plantar fibromatosis    Pneumonia    Sleep apnea    Tinnitus     PAST SURGICAL HISTORY: Past Surgical History:  Procedure Laterality Date   COLONOSCOPY WITH PROPOFOL N/A 07/23/2020   Procedure: COLONOSCOPY WITH PROPOFOL;  Surgeon: Toledo, Benay Pike, MD;  Location: [facility] ENDOSCOPY;  Service: Gastroenterology;  Laterality: N/A;   SHOULDER SURGERY     Right x 2    FAMILY HISTORY Family History  Problem Relation Age of Onset   Emphysema Father    Cancer Father    Prostate cancer Neg Hx    Colon cancer Neg Hx    Coronary artery disease Neg Hx    Diabetes Neg Hx        ADVANCED DIRECTIVES:    HEALTH MAINTENANCE: Social History   Tobacco Use   Smoking status: Former    Packs/day: 1.00    Years: 10.00    Pack years: 10.00    Types: Cigarettes    Quit date: 01/19/1999    Years since quitting: 21.8   Smokeless tobacco: Former    Quit date: 01/19/2011  Vaping Use   Vaping Use: Never used  Substance Use Topics   Alcohol use: Yes    Alcohol/week: 4.0 standard drinks  Types: 4 Glasses of wine per week    Comment: daily   Drug use: No     Allergies  Allergen Reactions   Morphine And Related Nausea And Vomiting   Terfenadine Other (See Comments)    Irregular heartbeat Other reaction(s): GI Upset (intolerance)    Current Outpatient Medications  Medication Sig Dispense Refill   azelastine (ASTELIN) 0.1 % nasal spray Place into both nostrils 2 (two) times daily. Use in each nostril as directed     cyanocobalamin (,VITAMIN B-12,) 1000 MCG/ML injection Inject 1,000 mcg into the muscle every 30 (thirty) days.     metoprolol succinate (TOPROL-XL) 50 MG  24 hr tablet TAKE 1 TABLET BY MOUTH ONCE DAILY WITH  OR  IMMEDIATELY  FOLLOWING  A  MEAL 90 tablet 0   ondansetron (ZOFRAN-ODT) 4 MG disintegrating tablet Take 4 mg by mouth every 8 (eight) hours as needed for nausea or vomiting.     rivaroxaban (XARELTO) 20 MG TABS tablet TAKE ONE TABLET BY MOUTH ONE TIME DAILY with supper 90 tablet 1   SUMAtriptan (IMITREX) 100 MG tablet Take 100 mg by mouth as needed.      tadalafil (CIALIS) 20 MG tablet Take 20 mg by mouth daily as needed.     venlafaxine (EFFEXOR) 75 MG tablet Take 75 mg by mouth daily.     No current facility-administered medications for this visit.    OBJECTIVE: Vitals:   12/02/20 1448  BP: 134/76  Pulse: 62  Resp: 16  Temp: 97.8 F (36.6 C)  SpO2: 99%     Body mass index is 39.15 kg/m.    ECOG FS:0 - Asymptomatic  General: Well-developed, well-nourished, no acute distress. Eyes: Pink conjunctiva, anicteric sclera. HEENT: Normocephalic, moist mucous membranes. Lungs: No audible wheezing or coughing. Heart: Regular rate and rhythm. Abdomen: Soft, nontender, no obvious distention. Musculoskeletal: No edema, cyanosis, or clubbing. Neuro: Alert, answering all questions appropriately. Cranial nerves grossly intact. Skin: No rashes or petechiae noted. Psych: Normal affect.  LAB RESULTS:  Lab Results  Component Value Date   NA 136 01/16/2020   K 4.0 01/16/2020   CL 103 01/16/2020   CO2 22 01/16/2020   GLUCOSE 100 (H) 01/16/2020   BUN 12 01/16/2020   CREATININE 0.87 01/16/2020   CALCIUM 8.9 01/16/2020   PROT 7.3 01/16/2020   ALBUMIN 4.0 01/16/2020   AST 52 (H) 01/16/2020   ALT 66 (H) 01/16/2020   ALKPHOS 31 (L) 01/16/2020   BILITOT 1.2 01/16/2020   GFRNONAA >60 01/16/2020   GFRAA 98 02/28/2019    Lab Results  Component Value Date   WBC 5.0 12/01/2020   NEUTROABS 2.7 12/01/2020   HGB 15.7 12/01/2020   HCT 44.6 12/01/2020   MCV 96.3 12/01/2020   PLT 159 12/01/2020   Lab Results  Component Value Date    FERRITIN 350 (H) 12/01/2020     STUDIES: No results found.  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  PLAN:    1. Hereditary hemochromatosis: Patient's hemoglobin and iron stores continue to be within normal limits.  His ferritin continues to be persistently elevated at 350. Goal ferritin is 50-100.  Proceed with 500 mL phlebotomy today.  We briefly discussed the possibility of increasing the frequency of phlebotomy, but ultimately decided on continuing as planned.  Return to clinic in 2 and 4 months for laboratory work and phlebotomy only.  Patient will then return to clinic in 6 months with repeat laboratory work, further evaluation,  and continuation of treatment if needed.  2. Atrial fibrillation: Continue Xarelto as ordered. 3. Hypertension: Patient's blood pressure is within normal limits today.   4.  Elevated liver enzymes: Repeat with next lab draw.  Patient expressed understanding that he can call or return to clinic at any time if he has any questions, concerns, or complaints.  [name], MD   12/02/2020 4:50 PM

## 2020-12-01 ENCOUNTER — Inpatient Hospital Stay: Payer: Medicare HMO | Attending: Oncology

## 2020-12-01 ENCOUNTER — Other Ambulatory Visit: Payer: Self-pay

## 2020-12-01 DIAGNOSIS — R748 Abnormal levels of other serum enzymes: Secondary | ICD-10-CM | POA: Insufficient documentation

## 2020-12-01 DIAGNOSIS — I480 Paroxysmal atrial fibrillation: Secondary | ICD-10-CM | POA: Insufficient documentation

## 2020-12-01 DIAGNOSIS — Z7901 Long term (current) use of anticoagulants: Secondary | ICD-10-CM | POA: Diagnosis not present

## 2020-12-01 DIAGNOSIS — I10 Essential (primary) hypertension: Secondary | ICD-10-CM | POA: Insufficient documentation

## 2020-12-01 LAB — CBC WITH DIFFERENTIAL/PLATELET
Abs Immature Granulocytes: 0.03 10*3/uL (ref 0.00–0.07)
Basophils Absolute: 0 10*3/uL (ref 0.0–0.1)
Basophils Relative: 1 %
Eosinophils Absolute: 0.2 10*3/uL (ref 0.0–0.5)
Eosinophils Relative: 5 %
HCT: 44.6 % (ref 39.0–52.0)
Hemoglobin: 15.7 g/dL (ref 13.0–17.0)
Immature Granulocytes: 1 %
Lymphocytes Relative: 31 %
Lymphs Abs: 1.5 10*3/uL (ref 0.7–4.0)
MCH: 33.9 pg (ref 26.0–34.0)
MCHC: 35.2 g/dL (ref 30.0–36.0)
MCV: 96.3 fL (ref 80.0–100.0)
Monocytes Absolute: 0.4 10*3/uL (ref 0.1–1.0)
Monocytes Relative: 9 %
Neutro Abs: 2.7 10*3/uL (ref 1.7–7.7)
Neutrophils Relative %: 53 %
Platelets: 159 10*3/uL (ref 150–400)
RBC: 4.63 MIL/uL (ref 4.22–5.81)
RDW: 11.8 % (ref 11.5–15.5)
WBC: 5 10*3/uL (ref 4.0–10.5)
nRBC: 0 % (ref 0.0–0.2)

## 2020-12-01 LAB — IRON AND TIBC
Iron: 116 ug/dL (ref 45–182)
Saturation Ratios: 34 % (ref 17.9–39.5)
TIBC: 344 ug/dL (ref 250–450)
UIBC: 228 ug/dL

## 2020-12-01 LAB — FERRITIN: Ferritin: 350 ng/mL — ABNORMAL HIGH (ref 24–336)

## 2020-12-02 ENCOUNTER — Inpatient Hospital Stay: Payer: Medicare HMO

## 2020-12-02 ENCOUNTER — Inpatient Hospital Stay (HOSPITAL_BASED_OUTPATIENT_CLINIC_OR_DEPARTMENT_OTHER): Payer: Medicare HMO | Admitting: Oncology

## 2020-12-02 ENCOUNTER — Telehealth: Payer: Self-pay | Admitting: Emergency Medicine

## 2020-12-02 DIAGNOSIS — R748 Abnormal levels of other serum enzymes: Secondary | ICD-10-CM | POA: Diagnosis not present

## 2020-12-02 DIAGNOSIS — I10 Essential (primary) hypertension: Secondary | ICD-10-CM | POA: Diagnosis not present

## 2020-12-02 DIAGNOSIS — Z7901 Long term (current) use of anticoagulants: Secondary | ICD-10-CM | POA: Diagnosis not present

## 2020-12-02 DIAGNOSIS — I480 Paroxysmal atrial fibrillation: Secondary | ICD-10-CM | POA: Diagnosis not present

## 2020-12-02 LAB — AFP TUMOR MARKER: AFP, Serum, Tumor Marker: 4.3 ng/mL (ref 0.0–8.4)

## 2020-12-02 NOTE — Telephone Encounter (Signed)
Opened in error

## 2020-12-02 NOTE — Patient Instructions (Signed)

## 2020-12-02 NOTE — Progress Notes (Signed)
Pt has no concerns at this time. 

## 2020-12-02 NOTE — Progress Notes (Signed)
500 ml of blood removed per MD orders. Pt tolerated procedure well. Declined a drink or snack. States he feels fine and is ready for discharge. VSS.

## 2021-01-19 ENCOUNTER — Other Ambulatory Visit: Payer: Self-pay | Admitting: Cardiovascular Disease

## 2021-01-19 DIAGNOSIS — I480 Paroxysmal atrial fibrillation: Secondary | ICD-10-CM

## 2021-01-29 ENCOUNTER — Other Ambulatory Visit: Payer: Self-pay | Admitting: *Deleted

## 2021-02-03 ENCOUNTER — Inpatient Hospital Stay: Payer: Medicare HMO | Attending: Oncology

## 2021-02-03 ENCOUNTER — Other Ambulatory Visit: Payer: Self-pay

## 2021-02-03 ENCOUNTER — Inpatient Hospital Stay: Payer: Medicare HMO

## 2021-02-03 LAB — COMPREHENSIVE METABOLIC PANEL
ALT: 56 U/L — ABNORMAL HIGH (ref 0–44)
AST: 44 U/L — ABNORMAL HIGH (ref 15–41)
Albumin: 4.2 g/dL (ref 3.5–5.0)
Alkaline Phosphatase: 39 U/L (ref 38–126)
Anion gap: 9 (ref 5–15)
BUN: 15 mg/dL (ref 6–20)
CO2: 24 mmol/L (ref 22–32)
Calcium: 8.7 mg/dL — ABNORMAL LOW (ref 8.9–10.3)
Chloride: 102 mmol/L (ref 98–111)
Creatinine, Ser: 1.14 mg/dL (ref 0.61–1.24)
GFR, Estimated: >60 mL/min (ref >60)
Glucose, Bld: 195 mg/dL — ABNORMAL HIGH (ref 70–99)
Potassium: 3.9 mmol/L (ref 3.5–5.1)
Sodium: 135 mmol/L (ref 135–145)
Total Bilirubin: 1.1 mg/dL (ref 0.3–1.2)
Total Protein: 7.1 g/dL (ref 6.5–8.1)

## 2021-02-03 LAB — IRON AND TIBC
Iron: 239 ug/dL — ABNORMAL HIGH (ref 45–182)
Saturation Ratios: 68 % — ABNORMAL HIGH (ref 17.9–39.5)
TIBC: 351 ug/dL (ref 250–450)
UIBC: 112 ug/dL

## 2021-02-03 LAB — CBC WITH DIFFERENTIAL/PLATELET
Abs Immature Granulocytes: 0.02 10*3/uL (ref 0.00–0.07)
Basophils Absolute: 0.1 10*3/uL (ref 0.0–0.1)
Basophils Relative: 1 %
Eosinophils Absolute: 0.2 10*3/uL (ref 0.0–0.5)
Eosinophils Relative: 4 %
HCT: 45.9 % (ref 39.0–52.0)
Hemoglobin: 15.7 g/dL (ref 13.0–17.0)
Immature Granulocytes: 0 %
Lymphocytes Relative: 33 %
Lymphs Abs: 1.8 10*3/uL (ref 0.7–4.0)
MCH: 32.6 pg (ref 26.0–34.0)
MCHC: 34.2 g/dL (ref 30.0–36.0)
MCV: 95.2 fL (ref 80.0–100.0)
Monocytes Absolute: 0.5 10*3/uL (ref 0.1–1.0)
Monocytes Relative: 10 %
Neutro Abs: 2.9 10*3/uL (ref 1.7–7.7)
Neutrophils Relative %: 52 %
Platelets: 164 10*3/uL (ref 150–400)
RBC: 4.82 MIL/uL (ref 4.22–5.81)
RDW: 11.5 % (ref 11.5–15.5)
WBC: 5.5 10*3/uL (ref 4.0–10.5)
nRBC: 0 % (ref 0.0–0.2)

## 2021-02-03 LAB — FERRITIN: Ferritin: 345 ng/mL — ABNORMAL HIGH (ref 24–336)

## 2021-02-03 NOTE — Patient Instructions (Signed)

## 2021-02-03 NOTE — Progress Notes (Signed)
Hgb 15.7, HCT 45.9 Ferritin on 12/01/20 was 350. Per Dr Grayland Ormond OK to use Nov ferritin for phlebotomy today. Ferritin was drawn today as well. Removed 500 ml of blood. Patient tolerated procedure well. VSS remain stable. Pt feeling well at time of discharge.

## 2021-02-23 DIAGNOSIS — M25572 Pain in left ankle and joints of left foot: Secondary | ICD-10-CM | POA: Diagnosis not present

## 2021-02-23 DIAGNOSIS — M7061 Trochanteric bursitis, right hip: Secondary | ICD-10-CM | POA: Diagnosis not present

## 2021-03-10 DIAGNOSIS — Z0000 Encounter for general adult medical examination without abnormal findings: Secondary | ICD-10-CM | POA: Diagnosis not present

## 2021-03-10 DIAGNOSIS — I10 Essential (primary) hypertension: Secondary | ICD-10-CM | POA: Diagnosis not present

## 2021-03-10 DIAGNOSIS — Z79899 Other long term (current) drug therapy: Secondary | ICD-10-CM | POA: Diagnosis not present

## 2021-03-10 DIAGNOSIS — R739 Hyperglycemia, unspecified: Secondary | ICD-10-CM | POA: Diagnosis not present

## 2021-03-10 DIAGNOSIS — I480 Paroxysmal atrial fibrillation: Secondary | ICD-10-CM | POA: Diagnosis not present

## 2021-03-10 DIAGNOSIS — F419 Anxiety disorder, unspecified: Secondary | ICD-10-CM | POA: Diagnosis not present

## 2021-03-10 DIAGNOSIS — E538 Deficiency of other specified B group vitamins: Secondary | ICD-10-CM | POA: Diagnosis not present

## 2021-03-23 DIAGNOSIS — L820 Inflamed seborrheic keratosis: Secondary | ICD-10-CM | POA: Diagnosis not present

## 2021-03-23 DIAGNOSIS — L570 Actinic keratosis: Secondary | ICD-10-CM | POA: Diagnosis not present

## 2021-03-23 DIAGNOSIS — D225 Melanocytic nevi of trunk: Secondary | ICD-10-CM | POA: Diagnosis not present

## 2021-03-23 DIAGNOSIS — L821 Other seborrheic keratosis: Secondary | ICD-10-CM | POA: Diagnosis not present

## 2021-03-23 DIAGNOSIS — Z85828 Personal history of other malignant neoplasm of skin: Secondary | ICD-10-CM | POA: Diagnosis not present

## 2021-03-23 DIAGNOSIS — D2272 Melanocytic nevi of left lower limb, including hip: Secondary | ICD-10-CM | POA: Diagnosis not present

## 2021-03-23 DIAGNOSIS — L538 Other specified erythematous conditions: Secondary | ICD-10-CM | POA: Diagnosis not present

## 2021-03-23 DIAGNOSIS — D2262 Melanocytic nevi of left upper limb, including shoulder: Secondary | ICD-10-CM | POA: Diagnosis not present

## 2021-04-07 ENCOUNTER — Inpatient Hospital Stay: Payer: Medicare HMO | Attending: Oncology

## 2021-04-07 ENCOUNTER — Inpatient Hospital Stay: Payer: Medicare HMO

## 2021-04-07 ENCOUNTER — Other Ambulatory Visit: Payer: Self-pay

## 2021-04-07 LAB — CBC WITH DIFFERENTIAL/PLATELET
Abs Immature Granulocytes: 0.03 10*3/uL (ref 0.00–0.07)
Basophils Absolute: 0 10*3/uL (ref 0.0–0.1)
Basophils Relative: 1 %
Eosinophils Absolute: 0.2 10*3/uL (ref 0.0–0.5)
Eosinophils Relative: 2 %
HCT: 46.6 % (ref 39.0–52.0)
Hemoglobin: 15.9 g/dL (ref 13.0–17.0)
Immature Granulocytes: 1 %
Lymphocytes Relative: 22 %
Lymphs Abs: 1.4 10*3/uL (ref 0.7–4.0)
MCH: 32.9 pg (ref 26.0–34.0)
MCHC: 34.1 g/dL (ref 30.0–36.0)
MCV: 96.3 fL (ref 80.0–100.0)
Monocytes Absolute: 0.6 10*3/uL (ref 0.1–1.0)
Monocytes Relative: 9 %
Neutro Abs: 4.1 10*3/uL (ref 1.7–7.7)
Neutrophils Relative %: 65 %
Platelets: 169 10*3/uL (ref 150–400)
RBC: 4.84 MIL/uL (ref 4.22–5.81)
RDW: 11.9 % (ref 11.5–15.5)
WBC: 6.2 10*3/uL (ref 4.0–10.5)
nRBC: 0 % (ref 0.0–0.2)

## 2021-04-07 LAB — IRON AND TIBC
Iron: 177 ug/dL (ref 45–182)
Saturation Ratios: 48 % — ABNORMAL HIGH (ref 17.9–39.5)
TIBC: 372 ug/dL (ref 250–450)
UIBC: 195 ug/dL

## 2021-04-07 LAB — FERRITIN: Ferritin: 219 ng/mL (ref 24–336)

## 2021-04-07 NOTE — Patient Instructions (Signed)

## 2021-04-07 NOTE — Progress Notes (Signed)
Proceed with phlebotomy. HCT 46.6 Hgb 15.9 Last ferritin was 345 on 02/03/21. Removed 500 ml of blood from R AC . Pt tolerated procedure well. Accepted a beverage. Feeling well at time of discharge. VSS. ?

## 2021-04-20 ENCOUNTER — Telehealth: Payer: Self-pay | Admitting: Cardiovascular Disease

## 2021-04-20 ENCOUNTER — Encounter: Payer: Self-pay | Admitting: Medical

## 2021-04-20 ENCOUNTER — Encounter: Payer: Self-pay | Admitting: Cardiovascular Disease

## 2021-04-20 ENCOUNTER — Ambulatory Visit: Payer: Medicare HMO | Admitting: Medical

## 2021-04-20 VITALS — BP 120/70 | HR 144 | Ht 72.0 in | Wt 285.1 lb

## 2021-04-20 DIAGNOSIS — G4733 Obstructive sleep apnea (adult) (pediatric): Secondary | ICD-10-CM | POA: Diagnosis not present

## 2021-04-20 DIAGNOSIS — I480 Paroxysmal atrial fibrillation: Secondary | ICD-10-CM

## 2021-04-20 DIAGNOSIS — I10 Essential (primary) hypertension: Secondary | ICD-10-CM

## 2021-04-20 DIAGNOSIS — I4891 Unspecified atrial fibrillation: Secondary | ICD-10-CM | POA: Diagnosis not present

## 2021-04-20 MED ORDER — METOPROLOL SUCCINATE ER 50 MG PO TB24: ORAL_TABLET | ORAL | 1 refills | Status: DC

## 2021-04-20 NOTE — Progress Notes (Signed)
?Cardiology Office Note:   ? ?Date:  04/20/2021  ? ?ID:  Collin Holt, DOB [birth year], MRN [id number] ? ?PCP:  [name], MD  ?[facility] Cardiologist:  Ida Rogue, MD  ?[facility] Electrophysiologist:  None  ? ?Referring MD: [name], MD  ? ?Chief Complaint: annual follow-up ? ?History of Present Illness:   ? ?Collin Holt is a 61 y.o. male with a hx of ETOH use, obesity, OSA, long history of vertigo, paroxysmal Afib, prior cardiac cath in 2012 with no significant CAD, hemochromatosis who presents for follow-up.  ? ?The patient had prior cath in 2012 with no significant CAD. Echo 01/2010 showed normal LVEF. He was hospitalized in 07/2013 for afib and given amiodarone and subsequently converted to SR. Echo 07/2013 showed LVEF 50-55%, mild LVH. He has had difficulty affording Xarelto in the past.  ? ?Last seen 05/30/20 for pre-op evaluation for colonoscopy. Overall he was doing well from a cardiac standpoint.  ? ?Today, the patient reports rapid afib for the last 2 days ago. EKG shows Afib with rates in the 140s. When he's in afib he feels his heart racing, feels weak. No chest pain, has some shortness of breath. No LLE, orthopnea, pnd. He uses CPAP. Afib episode occurs once every 8 years. He denies missing Xarelto doses. HE drinks wine daily, 1-2 cups and 4-5 cups of coffee in the mornings.  ? ? ?Past Medical History:  ?Diagnosis Date  ? Allergic rhinitis   ? Anxiety   ? B12 deficiency   ? Benign paroxysmal vertigo   ? Chronic headaches   ? Depression   ? Diabetes mellitus without complication ([place])   ? DOE (dyspnea on exertion)   ? a. 02/2010 Cath: nl cors;  b. 02/2010 CPX: good fxnl capacity.  ? Dysrhythmia   ? Hereditary hemochromatosis ([place])   ? HTN (hypertension)   ? OSA (obstructive sleep apnea)   ? PAF (paroxysmal atrial fibrillation) ([place])   ? a. 2012 Echo: nl LV fxn, mod dil LA.  ? Plantar fibromatosis   ? Pneumonia   ? Sleep apnea   ? Tinnitus   ? ? ?Past Surgical History:   ?Procedure Laterality Date  ? COLONOSCOPY WITH PROPOFOL N/A 07/23/2020  ? Procedure: COLONOSCOPY WITH PROPOFOL;  Surgeon: Toledo, Benay Pike, MD;  Location: [facility] ENDOSCOPY;  Service: Gastroenterology;  Laterality: N/A;  ? SHOULDER SURGERY    ? Right x 2  ? ? ?Current Medications: ?Current Meds  ?Medication Sig  ? azelastine (ASTELIN) 0.1 % nasal spray Place into both nostrils 2 (two) times daily. Use in each nostril as directed  ? cyanocobalamin (,VITAMIN B-12,) 1000 MCG/ML injection Inject 1,000 mcg into the muscle every 30 (thirty) days.  ? metoprolol succinate (TOPROL-XL) 50 MG 24 hr tablet TAKE 1 TABLET BY MOUTH ONCE DAILY (TAKE  WITH  OR  IMMEDIATELY  FOLLOWING  A  MEAL  ? ondansetron (ZOFRAN-ODT) 4 MG disintegrating tablet Take 4 mg by mouth every 8 (eight) hours as needed for nausea or vomiting.  ? rivaroxaban (XARELTO) 20 MG TABS tablet TAKE ONE TABLET BY MOUTH ONE TIME DAILY with supper  ? SUMAtriptan (IMITREX) 100 MG tablet Take 100 mg by mouth as needed.   ? tadalafil (CIALIS) 20 MG tablet Take 20 mg by mouth daily as needed.  ? venlafaxine (EFFEXOR) 75 MG tablet Take 75 mg by mouth daily.  ?  ? ?Allergies:   Morphine and related and Terfenadine  ? ?Social History  ? ?  Socioeconomic History  ? Marital status: Married  ?  Spouse name: Jackelyn Poling  ? Number of children: Not on file  ? Years of education: Not on file  ? Highest education level: Not on file  ?Occupational History  ? Occupation: TRUCK DRIVER  ?  Employer: [employer]  ?Tobacco Use  ? Smoking status: Former  ?  Packs/day: 1.00  ?  Years: 10.00  ?  Pack years: 10.00  ?  Types: Cigarettes  ?  Quit date: 01/19/1999  ?  Years since quitting: 22.2  ? Smokeless tobacco: Former  ?  Quit date: 01/19/2011  ?Vaping Use  ? Vaping Use: Never used  ?Substance and Sexual Activity  ? Alcohol use: Yes  ?  Alcohol/week: 4.0 standard drinks  ?  Types: 4 Glasses of wine per week  ?  Comment: daily  ? Drug use: No  ? Sexual activity: Not on file  ?Other Topics  Concern  ? Not on file  ?Social History Narrative  ? HSG  ? Married - '82 - 17 yrs/divorced; married '06. He has 4 daughters  ? Regular Exercise -  NO  ?   ?   ? ?Social Determinants of Health  ? ?Financial Resource Strain: Not on file  ?Food Insecurity: Not on file  ?Transportation Needs: Not on file  ?Physical Activity: Not on file  ?Stress: Not on file  ?Social Connections: Not on file  ?  ? ?Family History: ?The patient's family history includes Cancer in his father; Emphysema in his father. There is no history of Prostate cancer, Colon cancer, Coronary artery disease, or Diabetes. ? ?ROS:   ?Please see the history of present illness.    ? All other systems reviewed and are negative. ? ?EKGs/Labs/Other Studies Reviewed:   ? ?The following studies were reviewed today: ? ? ?EKG:  EKG is  ordered today.  The ekg ordered today demonstrates Afib 144bpm, LAD, LAFB ? ?Recent Labs: ?02/03/2021: ALT 56; BUN 15; Creatinine, Ser 1.14; Potassium 3.9; Sodium 135 ?04/07/2021: Hemoglobin 15.9; Platelets 169  ?Recent Lipid Panel ?   ?Component Value Date/Time  ? CHOL 171 02/28/2019 1044  ? TRIG 157 (H) 02/28/2019 1044  ? TRIG 392 10/23/2007 0000  ? HDL 40 02/28/2019 1044  ? CHOLHDL 4.3 02/28/2019 1044  ? LDLCALC 103 (H) 02/28/2019 1044  ? LDLDIRECT 115 (H) 02/28/2019 1044  ? ? ? ?Physical Exam:   ? ?VS:  BP 120/70 (BP Location: Left Arm, Patient Position: Sitting, Cuff Size: Large)   Pulse (!) 144   Ht 6' (1.829 m)   Wt 285 lb 2 oz (129.3 kg)   SpO2 98%   BMI 38.67 kg/m?    ? ?Wt Readings from Last 3 Encounters:  ?04/20/21 285 lb 2 oz (129.3 kg)  ?12/02/20 288 lb 11.2 oz (131 kg)  ?07/18/20 282 lb 8 oz (128.1 kg)  ?  ? ?GEN:  Well nourished, well developed in no acute distress ?HEENT: Normal ?NECK: No JVD; No carotid bruits ?LYMPHATICS: No lymphadenopathy ?CARDIAC: Irreg Irreg, tachycardic, no murmurs, rubs, gallops ?RESPIRATORY:  Clear to auscultation without rales, wheezing or rhonchi  ?ABDOMEN: Soft, non-tender,  non-distended ?MUSCULOSKELETAL:  No edema; No deformity  ?SKIN: Warm and dry ?NEUROLOGIC:  Alert and oriented x 3 ?PSYCHIATRIC:  Normal affect  ? ?ASSESSMENT:   ? ?1. Rapid atrial fibrillation ([place])   ?2. OSA (obstructive sleep apnea)   ?3. Essential hypertension   ?4. Hemochromatosis, unspecified hemochromatosis type   ? ?PLAN:   ? ?  In order of problems listed above: ? ?Rapid Afib  ?Patient is in rapid afib today with rates in the 140s. H/o of afib with RVR reportedly every 8 years. He has palpitations, weakness and shortness of breath. Np chest pain. He denies missing doses of Xarelto. He is on ToprolXL '50mg'$  daily. I will increase this to '150mg'$  daily. I will check CBC, BMET, Mag and TSH. I will order an echo, which dose not have to be done urgently. I will see him back in 4 days, if he is still in rapid afib I will set him up for cardioversion. I recommended he decrease caffeine and alcohol intake. Also, do not hesitate to go to the ER for worsening symptoms and/or elevated heart rates.  ? ?HTN ?BP good today, increase Toprol as above.  ? ?Hemochromatosis ?He follows closely with heme/onc. ? ?OSA ?He reports compliance with CPAP.  ? ? ?Disposition: Follow up in 4 day(s) with APP  ? ? ? ?Signed, ?Kariyah Baugh Ninfa Meeker, PA-C  ?04/20/2021 3:06 PM    ?[place]  ?

## 2021-04-20 NOTE — Telephone Encounter (Signed)
Patient confirmed through MyChart that he will be here this afternoon for his 2:25 pm appointment with Cadence Kathlen Mody, PA. ? ?

## 2021-04-20 NOTE — Telephone Encounter (Signed)
My chart message received today from the patient as stated below: ?my name Collin Holt heart beats 110 to130 sometimes lower 80 90 due to afib .that has been 3 days.do i need to see doc. ? ?Attempted to call the patient to follow up. ?No answer- I left a message that he could call back, send me a message back through Mychart to follow up. ? ?I did go ahead and hold him a slot at 2:25 pm today with Cadence Kathlen Mody, PA. ? ?I asked the patient to please reach back out by phone/ MyChart.  ? ? ?

## 2021-04-20 NOTE — Patient Instructions (Signed)
Medication Instructions:  ?- Your physician has recommended you make the following change in your medication:  ? ?1) INCREASE Toprol XL (metoprolol succinate) 50 mg: ?- take 3 tablets (150 mg) by mouth ONCE daily  ? ?*If you need a refill on your cardiac medications before your next appointment, please call your pharmacy* ? ? ?Lab Work: ?- Your physician recommends that you have lab work today: CBC/ TSH/ BMP/ Magnesium ? ?If you have labs (blood work) drawn today and your tests are completely normal, you will receive your results only by: ?MyChart Message (if you have MyChart) OR ?A paper copy in the mail ?If you have any lab test that is abnormal or we need to change your treatment, we will call you to review the results. ? ? ?Testing/Procedures: ? ?1) Echocardiogram: ?- Your physician has requested that you have an echocardiogram. Echocardiography is a painless test that uses sound waves to create images of your heart. It provides your doctor with information about the size and shape of your heart and how well your heart?s chambers and valves are working. This procedure takes approximately one hour. There are no restrictions for this procedure. There is a possibility that an IV may need to be started during your test to inject an image enhancing agent. This is done to obtain more optimal pictures of your heart. Therefore we ask that you do at least drink some water prior to coming in to hydrate your veins.  ? ? ? ?Follow-Up: ?At [facility], you and your health needs are our priority.  As part of our continuing mission to provide you with exceptional heart care, we have created designated Provider Care Teams.  These Care Teams include your primary Cardiologist (physician) and Advanced Practice Providers (APPs -  Physician Assistants and Nurse Practitioners) who all work together to provide you with the care you need, when you need it. ? ?We recommend signing up for the patient portal called "MyChart".  Sign up  information is provided on this After Visit Summary.  MyChart is used to connect with patients for Virtual Visits (Telemedicine).  Patients are able to view lab/test results, encounter notes, upcoming appointments, etc.  Non-urgent messages can be sent to your provider as well.   ?To learn more about what you can do with MyChart, go to [email or web address].   ? ?Your next appointment:   ?Friday 04/24/21 @ 2:45 pm  ? ?The format for your next appointment:   ?In Person ? ?Provider:   ?Tarri Glenn, PA-C  ? ? ?Other Instructions ? ?Echocardiogram ?An echocardiogram is a test that uses sound waves (ultrasound) to produce images of the heart. ?Images from an echocardiogram can provide important information about: ?Heart size and shape. ?The size and thickness and movement of your heart's walls. ?Heart muscle function and strength. ?Heart valve function or if you have stenosis. Stenosis is when the heart valves are too narrow. ?If blood is flowing backward through the heart valves (regurgitation). ?A tumor or infectious growth around the heart valves. ?Areas of heart muscle that are not working well because of poor blood flow or injury from a heart attack. ?Aneurysm detection. An aneurysm is a weak or damaged part of an artery wall. The wall bulges out from the normal force of blood pumping through the body. ?Tell a health care provider about: ?Any allergies you have. ?All medicines you are taking, including vitamins, herbs, eye drops, creams, and over-the-counter medicines. ?Any blood disorders you have. ?Any surgeries you have had. ?  Any medical conditions you have. ?Whether you are pregnant or may be pregnant. ?What are the risks? ?Generally, this is a safe test. However, problems may occur, including an allergic reaction to dye (contrast) that may be used during the test. ?What happens before the test? ?No specific preparation is needed. You may eat and drink normally. ?What happens during the test? ? ?You will take  off your clothes from the waist up and put on a hospital gown. ?Electrodes or electrocardiogram (ECG)patches may be placed on your chest. The electrodes or patches are then connected to a device that monitors your heart rate and rhythm. ?You will lie down on a table for an ultrasound exam. A gel will be applied to your chest to help sound waves pass through your skin. ?A handheld device, called a transducer, will be pressed against your chest and moved over your heart. The transducer produces sound waves that travel to your heart and bounce back (or "echo" back) to the transducer. These sound waves will be captured in real-time and changed into images of your heart that can be viewed on a video monitor. The images will be recorded on a computer and reviewed by your health care provider. ?You may be asked to change positions or hold your breath for a short time. This makes it easier to get different views or better views of your heart. ?In some cases, you may receive contrast through an IV in one of your veins. This can improve the quality of the pictures from your heart. ?The procedure may vary among health care providers and hospitals. ?What can I expect after the test? ?You may return to your normal, everyday life, including diet, activities, and medicines, unless your health care provider tells you not to do that. ?Follow these instructions at home: ?It is up to you to get the results of your test. Ask your health care provider, or the department that is doing the test, when your results will be ready. ?Keep all follow-up visits. This is important. ?Summary ?An echocardiogram is a test that uses sound waves (ultrasound) to produce images of the heart. ?Images from an echocardiogram can provide important information about the size and shape of your heart, heart muscle function, heart valve function, and other possible heart problems. ?You do not need to do anything to prepare before this test. You may eat and  drink normally. ?After the echocardiogram is completed, you may return to your normal, everyday life, unless your health care provider tells you not to do that. ?This information is not intended to replace advice given to you by your health care provider. Make sure you discuss any questions you have with your health care provider. ?Document Revised: 09/17/2020 Document Reviewed: 08/28/2019 ?Elsevier Patient Education ? 2022 [place]. ? ? ?

## 2021-04-21 LAB — TSH: TSH: 2.93 u[IU]/mL (ref 0.450–4.500)

## 2021-04-21 LAB — BASIC METABOLIC PANEL
BUN/Creatinine Ratio: 13 (ref 10–24)
BUN: 14 mg/dL (ref 8–27)
CO2: 17 mmol/L — ABNORMAL LOW (ref 20–29)
Calcium: 9.8 mg/dL (ref 8.6–10.2)
Chloride: 107 mmol/L — ABNORMAL HIGH (ref 96–106)
Creatinine, Ser: 1.06 mg/dL (ref 0.76–1.27)
Glucose: 190 mg/dL — ABNORMAL HIGH (ref 70–99)
Potassium: 4.6 mmol/L (ref 3.5–5.2)
Sodium: 143 mmol/L (ref 134–144)
eGFR: 80 mL/min/{1.73_m2} (ref >59)

## 2021-04-21 LAB — CBC
Hematocrit: 49.5 % (ref 37.5–51.0)
Hemoglobin: 16.9 g/dL (ref 13.0–17.7)
MCH: 32.8 pg (ref 26.6–33.0)
MCHC: 34.1 g/dL (ref 31.5–35.7)
MCV: 96 fL (ref 79–97)
Platelets: 206 10*3/uL (ref 150–450)
RBC: 5.15 x10E6/uL (ref 4.14–5.80)
RDW: 12.1 % (ref 11.6–15.4)
WBC: 7.2 10*3/uL (ref 3.4–10.8)

## 2021-04-21 LAB — MAGNESIUM: Magnesium: 2 mg/dL (ref 1.6–2.3)

## 2021-04-22 ENCOUNTER — Ambulatory Visit (INDEPENDENT_AMBULATORY_CARE_PROVIDER_SITE_OTHER): Payer: Medicare HMO

## 2021-04-22 DIAGNOSIS — I4891 Unspecified atrial fibrillation: Secondary | ICD-10-CM

## 2021-04-23 LAB — ECHOCARDIOGRAM COMPLETE
AR max vel: 4.88 cm2
AV Area VTI: 6.19 cm2
AV Area mean vel: 4.69 cm2
AV Mean grad: 2.3 mmHg
AV Peak grad: 4.5 mmHg
Ao pk vel: 1.06 m/s
P 1/2 time: 748 msec
S' Lateral: 3.8 cm
Single Plane A4C EF: 48.5 %

## 2021-04-24 ENCOUNTER — Ambulatory Visit: Payer: Medicare HMO | Admitting: Medical

## 2021-04-24 ENCOUNTER — Encounter: Payer: Self-pay | Admitting: Medical

## 2021-04-24 VITALS — BP 120/84 | HR 114 | Ht 72.0 in | Wt 286.0 lb

## 2021-04-24 DIAGNOSIS — G4733 Obstructive sleep apnea (adult) (pediatric): Secondary | ICD-10-CM | POA: Diagnosis not present

## 2021-04-24 DIAGNOSIS — E782 Mixed hyperlipidemia: Secondary | ICD-10-CM

## 2021-04-24 DIAGNOSIS — Z789 Other specified health status: Secondary | ICD-10-CM

## 2021-04-24 DIAGNOSIS — I4891 Unspecified atrial fibrillation: Secondary | ICD-10-CM | POA: Diagnosis not present

## 2021-04-24 DIAGNOSIS — F1090 Alcohol use, unspecified, uncomplicated: Secondary | ICD-10-CM

## 2021-04-24 DIAGNOSIS — I10 Essential (primary) hypertension: Secondary | ICD-10-CM

## 2021-04-24 MED ORDER — METOPROLOL SUCCINATE ER 100 MG PO TB24: 200.0000 mg | ORAL_TABLET | Freq: Every day | ORAL | 3 refills | Status: DC

## 2021-04-24 NOTE — H&P (View-Only) (Signed)
?Cardiology Office Note:   ? ?Date:  04/27/2021  ? ?ID:  Collin Holt, DOB [birth year], MRN [id number] ? ?PCP:  [name], MD  ?[facility] Cardiologist:  Ida Rogue, MD  ?[facility] Electrophysiologist:  None  ? ?Referring MD: [name], MD  ? ?Chief Complaint: rapid afib/echo f/u ? ?History of Present Illness:   ? ?Collin Holt is a 61 y.o. male with a hx of  PAF, EtOH use, obesity, OSA, vertigo, headaches, hemochromatosis followed by hematology, HTN who presents for echo follow-up.  ? ?He is a prior cardiac catheterization 2012 with no significant CAD.  Echocardiogram 01/2010 with normal LVEF.  He was hospitalized July 2015 for atrial fibrillation and given amiodarone and subsequently converted to sinus rhythm.  Echocardiogram 07/2013 with LVEF 50-55%, mild LVH.  Of note, he has had difficulty with affording his Xarelto in the past. ? ?Last seen 4/3/223 and was in rapid afib. Reported daily wine intake. Toprol was increased to '150mg'$  daily. Labs were drawn. Echo was ordered.  ? ?Echo showed LVEF 55-60%, no WMA, mild LVH, normal RVSF, trivial MR, mild AI. ? ?Today, the patient reports he feels the same. EKG shows Afib with rate 114bpm. He denies missing Xarelto doses. No other changes since the last visit. Still drinking alcohol and caffeine daily.  ? ?Past Medical History:  ?Diagnosis Date  ? Allergic rhinitis   ? Anxiety   ? B12 deficiency   ? Benign paroxysmal vertigo   ? Chronic headaches   ? Depression   ? Diabetes mellitus without complication ([place])   ? DOE (dyspnea on exertion)   ? a. 02/2010 Cath: nl cors;  b. 02/2010 CPX: good fxnl capacity.  ? Dysrhythmia   ? Hereditary hemochromatosis ([place])   ? HTN (hypertension)   ? OSA (obstructive sleep apnea)   ? PAF (paroxysmal atrial fibrillation) ([place])   ? a. 2012 Echo: nl LV fxn, mod dil LA.  ? Plantar fibromatosis   ? Pneumonia   ? Sleep apnea   ? Tinnitus   ? ? ?Past Surgical History:  ?Procedure Laterality Date  ? COLONOSCOPY WITH  PROPOFOL N/A 07/23/2020  ? Procedure: COLONOSCOPY WITH PROPOFOL;  Surgeon: Toledo, Benay Pike, MD;  Location: [facility] ENDOSCOPY;  Service: Gastroenterology;  Laterality: N/A;  ? SHOULDER SURGERY    ? Right x 2  ? ? ?Current Medications: ?Current Meds  ?Medication Sig  ? azelastine (ASTELIN) 0.1 % nasal spray Place into both nostrils 2 (two) times daily. Use in each nostril as directed  ? cyanocobalamin (,VITAMIN B-12,) 1000 MCG/ML injection Inject 1,000 mcg into the muscle every 30 (thirty) days.  ? metoprolol succinate (TOPROL-XL) 100 MG 24 hr tablet Take 2 tablets (200 mg total) by mouth daily. Take with or immediately following a meal.  ? ondansetron (ZOFRAN-ODT) 4 MG disintegrating tablet Take 4 mg by mouth every 8 (eight) hours as needed for nausea or vomiting.  ? rivaroxaban (XARELTO) 20 MG TABS tablet TAKE ONE TABLET BY MOUTH ONE TIME DAILY with supper  ? SUMAtriptan (IMITREX) 100 MG tablet Take 100 mg by mouth as needed.   ? tadalafil (CIALIS) 20 MG tablet Take 20 mg by mouth daily as needed.  ? venlafaxine (EFFEXOR) 75 MG tablet Take 75 mg by mouth daily.  ? [DISCONTINUED] metoprolol succinate (TOPROL-XL) 50 MG 24 hr tablet Take 3 tablets (150 mg) by mouth once daily. Take with or immediately following a meal.  ?  ? ?Allergies:   Morphine and related  and Terfenadine  ? ?Social History  ? ?Socioeconomic History  ? Marital status: Married  ?  Spouse name: Jackelyn Poling  ? Number of children: Not on file  ? Years of education: Not on file  ? Highest education level: Not on file  ?Occupational History  ? Occupation: TRUCK DRIVER  ?  Employer: [employer]  ?Tobacco Use  ? Smoking status: Former  ?  Packs/day: 1.00  ?  Years: 10.00  ?  Pack years: 10.00  ?  Types: Cigarettes  ?  Quit date: 01/19/1999  ?  Years since quitting: 22.2  ? Smokeless tobacco: Former  ?  Quit date: 01/19/2011  ?Vaping Use  ? Vaping Use: Never used  ?Substance and Sexual Activity  ? Alcohol use: Yes  ?  Alcohol/week: 4.0 standard drinks  ?   Types: 4 Glasses of wine per week  ?  Comment: daily  ? Drug use: No  ? Sexual activity: Not on file  ?Other Topics Concern  ? Not on file  ?Social History Narrative  ? HSG  ? Married - '82 - 17 yrs/divorced; married '06. He has 4 daughters  ? Regular Exercise -  NO  ?   ?   ? ?Social Determinants of Health  ? ?Financial Resource Strain: Not on file  ?Food Insecurity: Not on file  ?Transportation Needs: Not on file  ?Physical Activity: Not on file  ?Stress: Not on file  ?Social Connections: Not on file  ?  ? ?Family History: ?The patient's family history includes Cancer in his father; Emphysema in his father. There is no history of Prostate cancer, Colon cancer, Coronary artery disease, or Diabetes. ? ?ROS:   ?Please see the history of present illness.    ? All other systems reviewed and are negative. ? ?EKGs/Labs/Other Studies Reviewed:   ? ?The following studies were reviewed today: ? ?Echo 04/22/21 ? 1. Left ventricular ejection fraction, by estimation, is 55 to 60%. The  ?left ventricle has normal function. The left ventricle has no regional  ?wall motion abnormalities. There is mild left ventricular hypertrophy.  ?Left ventricular diastolic parameters  ?are indeterminate.  ? 2. Right ventricular systolic function is normal. The right ventricular  ?size is normal.  ? 3. The mitral valve is normal in structure. Trivial mitral valve  ?regurgitation.  ? 4. The aortic valve is tricuspid. Aortic valve regurgitation is mild.  ? 5. The inferior vena cava is normal in size with <50% respiratory  ?variability, suggesting right atrial pressure of 8 mmHg.  ? ?EKG:  EKG is  ordered today.  The ekg ordered today demonstrates Afib 114bpm ? ?Recent Labs: ?02/03/2021: ALT 56 ?04/20/2021: Magnesium 2.0; TSH 2.930 ?04/24/2021: BUN 18; Creatinine, Ser 1.12; Hemoglobin 16.3; Platelets 206; Potassium 4.3; Sodium 140  ?Recent Lipid Panel ?   ?Component Value Date/Time  ? CHOL 171 02/28/2019 1044  ? TRIG 157 (H) 02/28/2019 1044  ? TRIG 392  10/23/2007 0000  ? HDL 40 02/28/2019 1044  ? CHOLHDL 4.3 02/28/2019 1044  ? LDLCALC 103 (H) 02/28/2019 1044  ? LDLDIRECT 115 (H) 02/28/2019 1044  ? ? ?Physical Exam:   ? ?VS:  BP 120/84 (BP Location: Left Arm, Patient Position: Sitting, Cuff Size: Large)   Pulse (!) 114   Ht 6' (1.829 m)   Wt 286 lb (129.7 kg)   SpO2 98%   BMI 38.79 kg/m?    ? ?Wt Readings from Last 3 Encounters:  ?04/24/21 286 lb (129.7 kg)  ?04/20/21 285  lb 2 oz (129.3 kg)  ?12/02/20 288 lb 11.2 oz (131 kg)  ?  ? ?GEN:  Well nourished, well developed in no acute distress ?HEENT: Normal ?NECK: No JVD; No carotid bruits ?LYMPHATICS: No lymphadenopathy ?CARDIAC: Irreg Irreg, no murmurs, rubs, gallops ?RESPIRATORY:  Clear to auscultation without rales, wheezing or rhonchi  ?ABDOMEN: Soft, non-tender, non-distended ?MUSCULOSKELETAL:  No edema; No deformity  ?SKIN: Warm and dry ?NEUROLOGIC:  Alert and oriented x 3 ?PSYCHIATRIC:  Normal affect  ? ?ASSESSMENT:   ? ?1. Rapid atrial fibrillation ([place])   ?2. OSA (obstructive sleep apnea)   ?3. Essential hypertension   ?4. Alcohol use   ?5. Hemochromatosis, unspecified hemochromatosis type   ? ?PLAN:   ? ?In order of problems listed above: ? ?Afib RVR ?He is still in Afib RVR, but rates have improved. He feels about the same as previous visit. Echo showed normal LVEF. Labs were unremarkable. I will increase ToprolXL to '200mg'$  daily.He denies missing doses of Xarelto.  I will set him up for cardioversion next week. We will see him back after this ? ?HTN ?BP good today, increase Toprol as above.  ? ?Hemochromatosis ?He follows closely with heme/onc. ? ?OSA ?Compliant with CPAP ? ?Alcohol use ?He drinks 1-2 glasses of wine daily, trying to decrease alcohol use.  ? ?Disposition: Follow up in 3 week(s) with MD/APP  ? ?Shared Decision Making/Informed Consent   ?Shared Decision Making/Informed Consent ?The risks (stroke, cardiac arrhythmias rarely resulting in the need for a temporary or permanent pacemaker,  skin irritation or burns and complications associated with conscious sedation including aspiration, arrhythmia, respiratory failure and death), benefits (restoration of normal sinus rhythm) and alternatives of a direct c

## 2021-04-24 NOTE — Progress Notes (Signed)
?Cardiology Office Note:   ? ?Date:  04/27/2021  ? ?ID:  Collin Holt, DOB December 07, 1960, MRN [id number] ? ?PCP:  [name], MD  ?[facility] Cardiologist:  Ida Rogue, MD  ?[facility] Electrophysiologist:  None  ? ?Referring MD: [name], MD  ? ?Chief Complaint: rapid afib/echo f/u ? ?History of Present Illness:   ? ?Collin Holt is a 61 y.o. male with a hx of  PAF, EtOH use, obesity, OSA, vertigo, headaches, hemochromatosis followed by hematology, HTN who presents for echo follow-up.  ? ?He is a prior cardiac catheterization 2012 with no significant CAD.  Echocardiogram 01/2010 with normal LVEF.  He was hospitalized July 2015 for atrial fibrillation and given amiodarone and subsequently converted to sinus rhythm.  Echocardiogram 07/2013 with LVEF 50-55%, mild LVH.  Of note, he has had difficulty with affording his Xarelto in the past. ? ?Last seen 4/3/223 and was in rapid afib. Reported daily wine intake. Toprol was increased to '150mg'$  daily. Labs were drawn. Echo was ordered.  ? ?Echo showed LVEF 55-60%, no WMA, mild LVH, normal RVSF, trivial MR, mild AI. ? ?Today, the patient reports he feels the same. EKG shows Afib with rate 114bpm. He denies missing Xarelto doses. No other changes since the last visit. Still drinking alcohol and caffeine daily.  ? ?Past Medical History:  ?Diagnosis Date  ? Allergic rhinitis   ? Anxiety   ? B12 deficiency   ? Benign paroxysmal vertigo   ? Chronic headaches   ? Depression   ? Diabetes mellitus without complication ([place])   ? DOE (dyspnea on exertion)   ? a. 02/2010 Cath: nl cors;  b. 02/2010 CPX: good fxnl capacity.  ? Dysrhythmia   ? Hereditary hemochromatosis ([place])   ? HTN (hypertension)   ? OSA (obstructive sleep apnea)   ? PAF (paroxysmal atrial fibrillation) ([place])   ? a. 2012 Echo: nl LV fxn, mod dil LA.  ? Plantar fibromatosis   ? Pneumonia   ? Sleep apnea   ? Tinnitus   ? ? ?Past Surgical History:  ?Procedure Laterality Date  ? COLONOSCOPY WITH  PROPOFOL N/A 07/23/2020  ? Procedure: COLONOSCOPY WITH PROPOFOL;  Surgeon: Toledo, Benay Pike, MD;  Location: [facility] ENDOSCOPY;  Service: Gastroenterology;  Laterality: N/A;  ? SHOULDER SURGERY    ? Right x 2  ? ? ?Current Medications: ?Current Meds  ?Medication Sig  ? azelastine (ASTELIN) 0.1 % nasal spray Place into both nostrils 2 (two) times daily. Use in each nostril as directed  ? cyanocobalamin (,VITAMIN B-12,) 1000 MCG/ML injection Inject 1,000 mcg into the muscle every 30 (thirty) days.  ? metoprolol succinate (TOPROL-XL) 100 MG 24 hr tablet Take 2 tablets (200 mg total) by mouth daily. Take with or immediately following a meal.  ? ondansetron (ZOFRAN-ODT) 4 MG disintegrating tablet Take 4 mg by mouth every 8 (eight) hours as needed for nausea or vomiting.  ? rivaroxaban (XARELTO) 20 MG TABS tablet TAKE ONE TABLET BY MOUTH ONE TIME DAILY with supper  ? SUMAtriptan (IMITREX) 100 MG tablet Take 100 mg by mouth as needed.   ? tadalafil (CIALIS) 20 MG tablet Take 20 mg by mouth daily as needed.  ? venlafaxine (EFFEXOR) 75 MG tablet Take 75 mg by mouth daily.  ? [DISCONTINUED] metoprolol succinate (TOPROL-XL) 50 MG 24 hr tablet Take 3 tablets (150 mg) by mouth once daily. Take with or immediately following a meal.  ?  ? ?Allergies:   Morphine and related  and Terfenadine  ? ?Social History  ? ?Socioeconomic History  ? Marital status: Married  ?  Spouse name: Jackelyn Poling  ? Number of children: Not on file  ? Years of education: Not on file  ? Highest education level: Not on file  ?Occupational History  ? Occupation: TRUCK DRIVER  ?  Employer: [employer]  ?Tobacco Use  ? Smoking status: Former  ?  Packs/day: 1.00  ?  Years: 10.00  ?  Pack years: 10.00  ?  Types: Cigarettes  ?  Quit date: 01/19/1999  ?  Years since quitting: 22.2  ? Smokeless tobacco: Former  ?  Quit date: 01/19/2011  ?Vaping Use  ? Vaping Use: Never used  ?Substance and Sexual Activity  ? Alcohol use: Yes  ?  Alcohol/week: 4.0 standard drinks  ?   Types: 4 Glasses of wine per week  ?  Comment: daily  ? Drug use: No  ? Sexual activity: Not on file  ?Other Topics Concern  ? Not on file  ?Social History Narrative  ? HSG  ? Married - '82 - 17 yrs/divorced; married '06. He has 4 daughters  ? Regular Exercise -  NO  ?   ?   ? ?Social Determinants of Health  ? ?Financial Resource Strain: Not on file  ?Food Insecurity: Not on file  ?Transportation Needs: Not on file  ?Physical Activity: Not on file  ?Stress: Not on file  ?Social Connections: Not on file  ?  ? ?Family History: ?The patient's family history includes Cancer in his father; Emphysema in his father. There is no history of Prostate cancer, Colon cancer, Coronary artery disease, or Diabetes. ? ?ROS:   ?Please see the history of present illness.    ? All other systems reviewed and are negative. ? ?EKGs/Labs/Other Studies Reviewed:   ? ?The following studies were reviewed today: ? ?Echo 04/22/21 ? 1. Left ventricular ejection fraction, by estimation, is 55 to 60%. The  ?left ventricle has normal function. The left ventricle has no regional  ?wall motion abnormalities. There is mild left ventricular hypertrophy.  ?Left ventricular diastolic parameters  ?are indeterminate.  ? 2. Right ventricular systolic function is normal. The right ventricular  ?size is normal.  ? 3. The mitral valve is normal in structure. Trivial mitral valve  ?regurgitation.  ? 4. The aortic valve is tricuspid. Aortic valve regurgitation is mild.  ? 5. The inferior vena cava is normal in size with <50% respiratory  ?variability, suggesting right atrial pressure of 8 mmHg.  ? ?EKG:  EKG is  ordered today.  The ekg ordered today demonstrates Afib 114bpm ? ?Recent Labs: ?02/03/2021: ALT 56 ?04/20/2021: Magnesium 2.0; TSH 2.930 ?04/24/2021: BUN 18; Creatinine, Ser 1.12; Hemoglobin 16.3; Platelets 206; Potassium 4.3; Sodium 140  ?Recent Lipid Panel ?   ?Component Value Date/Time  ? CHOL 171 02/28/2019 1044  ? TRIG 157 (H) 02/28/2019 1044  ? TRIG 392  10/23/2007 0000  ? HDL 40 02/28/2019 1044  ? CHOLHDL 4.3 02/28/2019 1044  ? LDLCALC 103 (H) 02/28/2019 1044  ? LDLDIRECT 115 (H) 02/28/2019 1044  ? ? ?Physical Exam:   ? ?VS:  BP 120/84 (BP Location: Left Arm, Patient Position: Sitting, Cuff Size: Large)   Pulse (!) 114   Ht 6' (1.829 m)   Wt 286 lb (129.7 kg)   SpO2 98%   BMI 38.79 kg/m?    ? ?Wt Readings from Last 3 Encounters:  ?04/24/21 286 lb (129.7 kg)  ?04/20/21 285  lb 2 oz (129.3 kg)  ?12/02/20 288 lb 11.2 oz (131 kg)  ?  ? ?GEN:  Well nourished, well developed in no acute distress ?HEENT: Normal ?NECK: No JVD; No carotid bruits ?LYMPHATICS: No lymphadenopathy ?CARDIAC: Irreg Irreg, no murmurs, rubs, gallops ?RESPIRATORY:  Clear to auscultation without rales, wheezing or rhonchi  ?ABDOMEN: Soft, non-tender, non-distended ?MUSCULOSKELETAL:  No edema; No deformity  ?SKIN: Warm and dry ?NEUROLOGIC:  Alert and oriented x 3 ?PSYCHIATRIC:  Normal affect  ? ?ASSESSMENT:   ? ?1. Rapid atrial fibrillation ([place])   ?2. OSA (obstructive sleep apnea)   ?3. Essential hypertension   ?4. Alcohol use   ?5. Hemochromatosis, unspecified hemochromatosis type   ? ?PLAN:   ? ?In order of problems listed above: ? ?Afib RVR ?He is still in Afib RVR, but rates have improved. He feels about the same as previous visit. Echo showed normal LVEF. Labs were unremarkable. I will increase ToprolXL to '200mg'$  daily.He denies missing doses of Xarelto.  I will set him up for cardioversion next week. We will see him back after this ? ?HTN ?BP good today, increase Toprol as above.  ? ?Hemochromatosis ?He follows closely with heme/onc. ? ?OSA ?Compliant with CPAP ? ?Alcohol use ?He drinks 1-2 glasses of wine daily, trying to decrease alcohol use.  ? ?Disposition: Follow up in 3 week(s) with MD/APP  ? ?Shared Decision Making/Informed Consent   ?Shared Decision Making/Informed Consent ?The risks (stroke, cardiac arrhythmias rarely resulting in the need for a temporary or permanent pacemaker,  skin irritation or burns and complications associated with conscious sedation including aspiration, arrhythmia, respiratory failure and death), benefits (restoration of normal sinus rhythm) and alternatives of a direct c

## 2021-04-24 NOTE — Patient Instructions (Addendum)
Medication Instructions:  ?Your physician has recommended you make the following change in your medication:  ? ?INCREASE Toprol XL to 200 mg once daily ? ?*If you need a refill on your cardiac medications before your next appointment, please call your pharmacy* ? ? ?Lab Work: ?CBC & BMET today ? ?If you have labs (blood work) drawn today and your tests are completely normal, you will receive your results only by: ?MyChart Message (if you have MyChart) OR ?A paper copy in the mail ?If you have any lab test that is abnormal or we need to change your treatment, we will call you to review the results. ? ? ?Testing/Procedures: ?You are scheduled for a Cardioversion on Friday April 14th with Dr. Rockey Situ. ? ?Please arrive at the [place] [facility] at 06:30 a.m. on the day of your procedure. ? ?DIET INSTRUCTIONS:  ?Nothing to eat or drink after midnight except your medications with a small sip of water. ? ?      ?Medications:  YOU MAY TAKE ALL of your medications with a small amount of water. ? ?Must have a responsible person to drive you home. ? ?Bring a current list of your medications and current insurance cards.  ? ? ?If you have any questions after you get home, please call the office at [phone] ? ? ? ?Follow-Up: ?At [facility], you and your health needs are our priority.  As part of our continuing mission to provide you with exceptional heart care, we have created designated Provider Care Teams.  These Care Teams include your primary Cardiologist (physician) and Advanced Practice Providers (APPs -  Physician Assistants and Nurse Practitioners) who all work together to provide you with the care you need, when you need it. ? ? ?Your next appointment:   ?2 week(s) after cardioversion ? ?The format for your next appointment:   ?In Person ? ?Provider:   ?Ida Rogue, MD or Cadence Kathlen Mody, PA-C ?

## 2021-04-25 LAB — CBC
Hematocrit: 45.8 % (ref 37.5–51.0)
Hemoglobin: 16.3 g/dL (ref 13.0–17.7)
MCH: 34.1 pg — ABNORMAL HIGH (ref 26.6–33.0)
MCHC: 35.6 g/dL (ref 31.5–35.7)
MCV: 96 fL (ref 79–97)
Platelets: 206 10*3/uL (ref 150–450)
RBC: 4.78 x10E6/uL (ref 4.14–5.80)
RDW: 11.9 % (ref 11.6–15.4)
WBC: 7.8 10*3/uL (ref 3.4–10.8)

## 2021-04-25 LAB — BASIC METABOLIC PANEL
BUN/Creatinine Ratio: 16 (ref 10–24)
BUN: 18 mg/dL (ref 8–27)
CO2: 21 mmol/L (ref 20–29)
Calcium: 9.4 mg/dL (ref 8.6–10.2)
Chloride: 103 mmol/L (ref 96–106)
Creatinine, Ser: 1.12 mg/dL (ref 0.76–1.27)
Glucose: 93 mg/dL (ref 70–99)
Potassium: 4.3 mmol/L (ref 3.5–5.2)
Sodium: 140 mmol/L (ref 134–144)
eGFR: 75 mL/min/{1.73_m2} (ref >59)

## 2021-04-27 ENCOUNTER — Other Ambulatory Visit: Payer: Self-pay | Admitting: Cardiovascular Disease

## 2021-04-27 DIAGNOSIS — Z7901 Long term (current) use of anticoagulants: Secondary | ICD-10-CM

## 2021-04-27 DIAGNOSIS — I480 Paroxysmal atrial fibrillation: Secondary | ICD-10-CM

## 2021-04-28 NOTE — Telephone Encounter (Signed)
Prescription refill request for Xarelto received.  ?Indication:  Atrial fib ?Last office visit: 04/24/21  Read Drivers PA-C ?Weight: 129.7kg ?Age: 61 ?Scr: 1.12 on 04/24/21 ?CrCl: 128.67 ? ?Based on above findings Xarelto '20mg'$  daily is the appropriate dose.  Refill approved. ? ?

## 2021-04-28 NOTE — Telephone Encounter (Signed)
Pt last saw Cadence Furth, [place] on 04/24/21, last labs 04/24/21 Creat 1.12, age 61, weight 129.7kg, CrCl 128.67, based on CrCl pt is on appropriate dosage of Xarelto '20mg'$  QD for afib.  Will refill rx.  ?

## 2021-04-30 MED ORDER — SODIUM CHLORIDE 0.9 % IV SOLN
INTRAVENOUS | Status: DC
  Administered 2021-05-01: 1000 mL via INTRAVENOUS

## 2021-05-01 ENCOUNTER — Ambulatory Visit: Payer: Medicare HMO | Admitting: Anesthesiology

## 2021-05-01 ENCOUNTER — Ambulatory Visit
Admission: RE | Admit: 2021-05-01 | Discharge: 2021-05-01 | Disposition: A | Discharge status: Home / Self Care | Payer: Medicare HMO | Attending: Cardiovascular Disease | Admitting: Cardiovascular Disease

## 2021-05-01 ENCOUNTER — Encounter: Payer: Self-pay | Admitting: Cardiovascular Disease

## 2021-05-01 ENCOUNTER — Encounter
Admission: RE | Disposition: A | Discharge status: Home / Self Care | Payer: Self-pay | Source: Home / Self Care | Attending: Cardiovascular Disease

## 2021-05-01 DIAGNOSIS — I4819 Other persistent atrial fibrillation: Secondary | ICD-10-CM

## 2021-05-01 DIAGNOSIS — E119 Type 2 diabetes mellitus without complications: Secondary | ICD-10-CM | POA: Insufficient documentation

## 2021-05-01 DIAGNOSIS — F32A Depression, unspecified: Secondary | ICD-10-CM | POA: Diagnosis not present

## 2021-05-01 DIAGNOSIS — I4891 Unspecified atrial fibrillation: Secondary | ICD-10-CM | POA: Diagnosis not present

## 2021-05-01 DIAGNOSIS — G4733 Obstructive sleep apnea (adult) (pediatric): Secondary | ICD-10-CM | POA: Diagnosis not present

## 2021-05-01 DIAGNOSIS — F419 Anxiety disorder, unspecified: Secondary | ICD-10-CM | POA: Diagnosis not present

## 2021-05-01 DIAGNOSIS — I351 Nonrheumatic aortic (valve) insufficiency: Secondary | ICD-10-CM | POA: Insufficient documentation

## 2021-05-01 DIAGNOSIS — Z87891 Personal history of nicotine dependence: Secondary | ICD-10-CM | POA: Diagnosis not present

## 2021-05-01 DIAGNOSIS — Z79899 Other long term (current) drug therapy: Secondary | ICD-10-CM | POA: Diagnosis not present

## 2021-05-01 DIAGNOSIS — E669 Obesity, unspecified: Secondary | ICD-10-CM | POA: Insufficient documentation

## 2021-05-01 DIAGNOSIS — Z7901 Long term (current) use of anticoagulants: Secondary | ICD-10-CM | POA: Insufficient documentation

## 2021-05-01 DIAGNOSIS — I10 Essential (primary) hypertension: Secondary | ICD-10-CM | POA: Insufficient documentation

## 2021-05-01 DIAGNOSIS — K760 Fatty (change of) liver, not elsewhere classified: Secondary | ICD-10-CM | POA: Insufficient documentation

## 2021-05-01 DIAGNOSIS — Z6838 Body mass index (BMI) 38.0-38.9, adult: Secondary | ICD-10-CM | POA: Diagnosis not present

## 2021-05-01 HISTORY — PX: CARDIOVERSION: SHX1299

## 2021-05-01 SURGERY — CARDIOVERSION
Anesthesia: General

## 2021-05-01 MED ORDER — PROPOFOL 10 MG/ML IV BOLUS
INTRAVENOUS | Status: DC | PRN
  Administered 2021-05-01: 20 mg via INTRAVENOUS
  Administered 2021-05-01: 80 mg via INTRAVENOUS

## 2021-05-01 MED ORDER — LIDOCAINE HCL (PF) 2 % IJ SOLN
INTRAMUSCULAR | Status: AC
  Filled 2021-05-01: qty 5

## 2021-05-01 MED ORDER — PROPOFOL 10 MG/ML IV BOLUS
INTRAVENOUS | Status: AC
  Filled 2021-05-01: qty 20

## 2021-05-01 NOTE — H&P (Signed)
H&P Addendum, pre-cardioversion ° °Patient was seen and evaluated prior to -cardioversion procedure °Symptoms, prior testing details again confirmed with the patient °Patient examined, no significant change from prior exam °Lab work reviewed in detail personally by myself °Patient understands risk and benefit of the procedure,  °The risks (stroke, cardiac arrhythmias rarely resulting in the need for a temporary or permanent pacemaker, skin irritation or burns and complications associated with conscious sedation including aspiration, arrhythmia, respiratory failure and death), benefits (restoration of normal sinus rhythm) and alternatives of a direct current cardioversion were explained in detail °Patient willing to proceed. ° °Signed, °Tim Jadakiss Barish, MD, Ph.D °[facility]  °

## 2021-05-01 NOTE — Interval H&P Note (Signed)
History and Physical Interval Note: ? ?05/01/2021 ?6:25 PM ? ?Collin Holt  has presented today for surgery, with the diagnosis of Cardioversion  Afib.  The various methods of treatment have been discussed with the patient and family. After consideration of risks, benefits and other options for treatment, the patient has consented to  Procedure(s): ?CARDIOVERSION (N/A) as a surgical intervention.  The patient's history has been reviewed, patient examined, no change in status, stable for surgery.  I have reviewed the patient's chart and labs.  Questions were answered to the patient's satisfaction.   ? ? ?Ida Rogue ? ? ?

## 2021-05-01 NOTE — Transfer of Care (Signed)
Immediate Anesthesia Transfer of Care Note ? ?Patient: Collin Holt ? ?Procedure(s) Performed: CARDIOVERSION ? ?Patient Location: PACU and Nursing Unit ? ?Anesthesia Type:General ? ?Level of Consciousness: drowsy and patient cooperative ? ?Airway & Oxygen Therapy: Patient Spontanous Breathing and Patient connected to nasal cannula oxygen ? ?Post-op Assessment: Report given to RN and Post -op Vital signs reviewed and stable ? ?Post vital signs: Reviewed and stable ? ?Last Vitals:  ?Vitals Value Taken Time  ?BP 106/82 05/01/21 0749  ?Temp    ?Pulse 67 05/01/21 0749  ?Resp 15 05/01/21 0749  ?SpO2 98 % 05/01/21 0749  ?Vitals shown include unvalidated device data. ? ?Last Pain:  ?Vitals:  ? 05/01/21 0659  ?TempSrc: Oral  ?PainSc: 0-No pain  ?   ? ?  ? ?Complications: No notable events documented. ?

## 2021-05-01 NOTE — Anesthesia Preprocedure Evaluation (Addendum)
Anesthesia Evaluation  ?Patient identified by MRN, date of birth, ID band ?Patient awake ? ? ? ?Reviewed: ?Allergy & Precautions, NPO status , Patient's Chart, lab work & pertinent test results ? ?History of Anesthesia Complications ?Negative for: history of anesthetic complications ? ?Airway ?Mallampati: IV ? ? ?Neck ROM: Full ? ? ? Dental ? ? ?Missing upper left tooth:   ?Pulmonary ?sleep apnea and Continuous Positive Airway Pressure Ventilation , former smoker (quit 2001),  ?  ?Pulmonary exam normal ?breath sounds clear to auscultation ? ? ? ? ? ? Cardiovascular ?hypertension, + dysrhythmias (a fib on Xarelto)  ?Rhythm:Irregular Rate:Normal ? ?Echo 04/22/21: ??1. Left ventricular ejection fraction, by estimation, is 55 to 60%. The left ventricle has normal function. The left ventricle has no regional wall motion abnormalities. There is mild left ventricular hypertrophy. Left ventricular diastolic parameters are indeterminate.  ??2. Right ventricular systolic function is normal. The right ventricular size is normal.  ??3. The mitral valve is normal in structure. Trivial mitral valve regurgitation.  ??4. The aortic valve is tricuspid. Aortic valve regurgitation is mild.  ??5. The inferior vena cava is normal in size with <50% respiratory variability, suggesting right atrial pressure of 8 mmHg.  ?? ?EKG 04/24/21: Afib 114bpm ?  ?Neuro/Psych ?PSYCHIATRIC DISORDERS Anxiety Depression Alcohol use disorder, "few glasses of wine" daily; last intake 04/30/21 ?  ? GI/Hepatic ?NAFLD ?  ?Endo/Other  ?Obesity  ? Renal/GU ?  ? ?  ?Musculoskeletal ? ? Abdominal ?  ?Peds ? Hematology ?Hereditary hemochromatosis   ?Anesthesia Other Findings ?Cardiology note 04/24/21:  ?Afib RVR ?He is still in Afib RVR, but rates have improved. He feels about the same as previous visit. Echo showed normal LVEF. Labs were unremarkable. I will increase ToprolXL to '200mg'$  daily.He denies missing doses of Xarelto.  I will  set him up for cardioversion next week. We will see him back after this ?? ?HTN ?BP good today, increase Toprol as above.  ?? ?Hemochromatosis ?He follows closely with heme/onc. ?? ?OSA ?Compliant with CPAP ?? ?Alcohol use ?He drinks 1-2 glasses of wine daily, trying to decrease alcohol use.  ?? ?Disposition: Follow up in 3 week(s) with MD/APP  ? Reproductive/Obstetrics ? ?  ? ? ? ? ? ? ? ? ? ? ? ? ? ?  ?  ? ? ? ? ? ? ? ?Anesthesia Physical ?Anesthesia Plan ? ?ASA: 3 ? ?Anesthesia Plan: General  ? ?Post-op Pain Management:   ? ?Induction: Intravenous ? ?PONV Risk Score and Plan: 2 and Propofol infusion, TIVA and Treatment may vary due to age or medical condition ? ?Airway Management Planned: Natural Airway ? ?Additional Equipment:  ? ?Intra-op Plan:  ? ?Post-operative Plan:  ? ?Informed Consent: I have reviewed the patients History and Physical, chart, labs and discussed the procedure including the risks, benefits and alternatives for the proposed anesthesia with the patient or authorized representative who has indicated his/her understanding and acceptance.  ? ? ? ? ? ?Plan Discussed with: CRNA ? ?Anesthesia Plan Comments: (LMA/GETA backup discussed.  Patient consented for risks of anesthesia including but not limited to:  ?- adverse reactions to medications ?- damage to eyes, teeth, lips or other oral mucosa ?- nerve damage due to positioning  ?- sore throat or hoarseness ?- damage to heart, brain, nerves, lungs, other parts of body or loss of life ? ?Informed patient about role of CRNA in peri- and intra-operative care.  Patient voiced understanding.)  ? ? ? ? ? ? ?Anesthesia Quick  Evaluation ? ?

## 2021-05-01 NOTE — Anesthesia Postprocedure Evaluation (Signed)
Anesthesia Post Note ? ?Patient: Collin Holt ? ?Procedure(s) Performed: CARDIOVERSION ? ?Patient location during evaluation: PACU ?Anesthesia Type: General ?Level of consciousness: awake and alert, oriented and patient cooperative ?Pain management: pain level controlled ?Vital Signs Assessment: post-procedure vital signs reviewed and stable ?Respiratory status: spontaneous breathing, nonlabored ventilation and respiratory function stable ?Cardiovascular status: blood pressure returned to baseline and stable ?Postop Assessment: adequate PO intake ?Anesthetic complications: no ? ? ?No notable events documented. ? ? ?Last Vitals:  ?Vitals:  ? 05/01/21 0753 05/01/21 0800  ?BP: 111/86 101/82  ?Pulse:    ?Resp:  16  ?Temp:    ?SpO2: 97%   ?  ?Last Pain:  ?Vitals:  ? 05/01/21 0659  ?TempSrc: Oral  ?PainSc: 0-No pain  ? ? ?  ?  ?  ?  ?  ?  ? ?Darrin Nipper ? ? ? ? ?

## 2021-05-01 NOTE — CV Procedure (Signed)
Cardioversion procedure note ?For atrial fibrillation, persistent. ? ?Procedure Details: ? ?Consent: Risks of procedure as well as the alternatives and risks of each were explained to the (patient/caregiver).  Consent for procedure obtained. ? ?Time Out: Verified patient identification, verified procedure, site/side was marked, verified correct patient position, special equipment/implants available, medications/allergies/relevent history reviewed, required imaging and test results available.  Performed ? ?Patient placed on cardiac monitor, pulse oximetry, supplemental oxygen as necessary.   ?Sedation given: propofol IV, Dr. Erenest Rasher ?Pacer pads placed anterior and posterior chest. ? ? ?Cardioverted 2 time(s).   ?Cardioverted at  150J, 200J. Synchronized biphasic ?Converted to NSR ? ? ?Evaluation: ?Findings: Post procedure EKG shows: NSR ?Complications: None ?Patient did tolerate procedure well. ? ?Time Spent Directly with the Patient: ? ?45 minutes  ? ?Esmond Plants, M.D., Ph.D.  ?

## 2021-05-04 ENCOUNTER — Encounter: Payer: Self-pay | Admitting: Cardiovascular Disease

## 2021-05-04 DIAGNOSIS — R079 Chest pain, unspecified: Secondary | ICD-10-CM

## 2021-05-11 ENCOUNTER — Ambulatory Visit: Payer: Medicare HMO | Admitting: Medical

## 2021-05-11 ENCOUNTER — Telehealth: Payer: Self-pay

## 2021-05-11 ENCOUNTER — Encounter: Payer: Self-pay | Admitting: Medical

## 2021-05-11 VITALS — BP 120/78 | HR 49 | Ht 72.0 in | Wt 287.4 lb

## 2021-05-11 DIAGNOSIS — I480 Paroxysmal atrial fibrillation: Secondary | ICD-10-CM | POA: Diagnosis not present

## 2021-05-11 DIAGNOSIS — Z7901 Long term (current) use of anticoagulants: Secondary | ICD-10-CM

## 2021-05-11 DIAGNOSIS — Z789 Other specified health status: Secondary | ICD-10-CM

## 2021-05-11 DIAGNOSIS — G4733 Obstructive sleep apnea (adult) (pediatric): Secondary | ICD-10-CM

## 2021-05-11 DIAGNOSIS — I10 Essential (primary) hypertension: Secondary | ICD-10-CM

## 2021-05-11 MED ORDER — METOPROLOL SUCCINATE ER 50 MG PO TB24: 50.0000 mg | ORAL_TABLET | Freq: Every day | ORAL | 3 refills | Status: DC

## 2021-05-11 MED ORDER — METOPROLOL SUCCINATE ER 25 MG PO TB24: ORAL_TABLET | ORAL | 3 refills | Status: DC

## 2021-05-11 NOTE — Telephone Encounter (Signed)
Lpm in regards to Xarelto refill.  It looks like it was refilled mid April and received by pharmacy. ?

## 2021-05-11 NOTE — Progress Notes (Signed)
?Cardiology Office Note:   ? ?Date:  05/11/2021  ? ?ID:  Collin Holt, DOB May 25, 1960, MRN [id number] ? ?PCP:  Collin Crouch, MD  ?[facility] Cardiologist:  Collin Rogue, MD  ?[facility] Electrophysiologist:  None  ? ?Referring MD: Collin Crouch, MD  ? ?Chief Complaint: DCCV follow-up ? ?History of Present Illness:   ? ?Collin Holt is a 61 y.o. male with a hx of with a hx of  PAF, EtOH use, obesity, OSA, vertigo, headaches, hemochromatosis followed by hematology, HTN who presents for echo follow-up.  ?  ?He is a prior cardiac catheterization 2012 with no significant CAD.  Echocardiogram 01/2010 with normal LVEF.  He was hospitalized July 2015 for atrial fibrillation and given amiodarone and subsequently converted to sinus rhythm.  Echocardiogram 07/2013 with LVEF 50-55%, mild LVH.  Of note, he has had difficulty with affording his Xarelto in the past. ?  ?Seen 4/3/223 and was in rapid afib. Reported daily wine intake. Toprol was increased to '150mg'$  daily. Labs were drawn. Echo was ordered.  ?  ?Echo showed LVEF 55-60%, no WMA, mild LVH, normal RVSF, trivial MR, mild AI. ? ?Seen back 04/24/21 and remained in Afib with rate 114bpm and he was set up for cardioversion.  ? ?Today,  EKG shows Sinus bradycardia with heart rate of 49bpm with PACs. He decreased the Toprol to '50mg'$  daily following the cardioversion. He is taking Xarelto '20mg'$  daily. He is drinking coffee in the morning. Drinks 1-2 glasses red wine a couple times a week, slowly improving. No chest pain, SOB, LLE, orthopnea.  ? ?Past Medical History:  ?Diagnosis Date  ? Allergic rhinitis   ? Anxiety   ? B12 deficiency   ? Benign paroxysmal vertigo   ? Chronic headaches   ? Depression   ? Diabetes mellitus without complication ([place])   ? DOE (dyspnea on exertion)   ? a. 02/2010 Cath: nl cors;  b. 02/2010 CPX: good fxnl capacity.  ? Dysrhythmia   ? Hereditary hemochromatosis ([place])   ? HTN (hypertension)   ? OSA (obstructive sleep apnea)   ? PAF  (paroxysmal atrial fibrillation) ([place])   ? a. 2012 Echo: nl LV fxn, mod dil LA.  ? Plantar fibromatosis   ? Pneumonia   ? Sleep apnea   ? Tinnitus   ? ? ?Past Surgical History:  ?Procedure Laterality Date  ? CARDIOVERSION N/A 05/01/2021  ? Procedure: CARDIOVERSION;  Surgeon: Collin Merritts, MD;  Location: [facility] ORS;  Service: Cardiovascular;  Laterality: N/A;  ? COLONOSCOPY WITH PROPOFOL N/A 07/23/2020  ? Procedure: COLONOSCOPY WITH PROPOFOL;  Surgeon: Holt, Collin Pike, MD;  Location: [facility] ENDOSCOPY;  Service: Gastroenterology;  Laterality: N/A;  ? SHOULDER SURGERY    ? Right x 2  ? ? ?Current Medications: ?Current Meds  ?Medication Sig  ? cyanocobalamin (,VITAMIN B-12,) 1000 MCG/ML injection Inject 1,000 mcg into the muscle every 14 (fourteen) days.  ? metoprolol succinate (TOPROL XL) 25 MG 24 hr tablet Take 1 tablet (25 mg) by mouth once daily. Take with or immediately following a meal.  ? ondansetron (ZOFRAN-ODT) 4 MG disintegrating tablet Take 4 mg by mouth every 8 (eight) hours as needed for nausea or vomiting.  ? rivaroxaban (XARELTO) 20 MG TABS tablet Take 1 tablet by mouth once daily with supper  ? SUMAtriptan (IMITREX) 100 MG tablet Take 100 mg by mouth every 2 (two) hours as needed for migraine.  ? tadalafil (CIALIS) 20 MG tablet Take 20 mg by  mouth daily as needed for erectile dysfunction.  ? venlafaxine XR (EFFEXOR-XR) 75 MG 24 hr capsule Take 75 mg by mouth daily with breakfast.  ? [DISCONTINUED] metoprolol succinate (TOPROL-XL) 50 MG 24 hr tablet Take 200 mg by mouth daily. Take with or immediately following a meal.  ?  ? ?Allergies:   Morphine and related and Terfenadine  ? ?Social History  ? ?Socioeconomic History  ? Marital status: Married  ?  Spouse name: Jackelyn Poling  ? Number of children: Not on file  ? Years of education: Not on file  ? Highest education level: Not on file  ?Occupational History  ? Occupation: TRUCK DRIVER  ?  Employer: [employer]  ?Tobacco Use  ? Smoking status: Former  ?   Packs/day: 1.00  ?  Years: 10.00  ?  Pack years: 10.00  ?  Types: Cigarettes  ?  Quit date: 01/19/1999  ?  Years since quitting: 22.3  ? Smokeless tobacco: Former  ?  Quit date: 01/19/2011  ?Vaping Use  ? Vaping Use: Never used  ?Substance and Sexual Activity  ? Alcohol use: Yes  ?  Alcohol/week: 4.0 standard drinks  ?  Types: 4 Glasses of wine per week  ?  Comment: daily  ? Drug use: No  ? Sexual activity: Not on file  ?Other Topics Concern  ? Not on file  ?Social History Narrative  ? HSG  ? Married - '82 - 17 yrs/divorced; married '06. He has 4 daughters  ? Regular Exercise -  NO  ?   ?   ? ?Social Determinants of Health  ? ?Financial Resource Strain: Not on file  ?Food Insecurity: Not on file  ?Transportation Needs: Not on file  ?Physical Activity: Not on file  ?Stress: Not on file  ?Social Connections: Not on file  ?  ? ?Family History: ?The patient's family history includes Cancer in his father; Emphysema in his father. There is no history of Prostate cancer, Colon cancer, Coronary artery disease, or Diabetes. ? ?ROS:   ?Please see the history of present illness.    ? All other systems reviewed and are negative. ? ?EKGs/Labs/Other Studies Reviewed:   ? ?The following studies were reviewed today: ?  ?Echo 04/22/21 ? 1. Left ventricular ejection fraction, by estimation, is 55 to 60%. The  ?left ventricle has normal function. The left ventricle has no regional  ?wall motion abnormalities. There is mild left ventricular hypertrophy.  ?Left ventricular diastolic parameters  ?are indeterminate.  ? 2. Right ventricular systolic function is normal. The right ventricular  ?size is normal.  ? 3. The mitral valve is normal in structure. Trivial mitral valve  ?regurgitation.  ? 4. The aortic valve is tricuspid. Aortic valve regurgitation is mild.  ? 5. The inferior vena cava is normal in size with <50% respiratory  ?variability, suggesting right atrial pressure of 8 mmHg.  ? ?EKG:  EKG is  ordered today.  The ekg ordered today  demonstrates Sinus bradycardia 49bpm with PACs, LAD, nonspecific T wave changes ? ?Recent Labs: ?02/03/2021: ALT 56 ?04/20/2021: Magnesium 2.0; TSH 2.930 ?04/24/2021: BUN 18; Creatinine, Ser 1.12; Hemoglobin 16.3; Platelets 206; Potassium 4.3; Sodium 140  ?Recent Lipid Panel ?   ?Component Value Date/Time  ? CHOL 171 02/28/2019 1044  ? TRIG 157 (H) 02/28/2019 1044  ? TRIG 392 10/23/2007 0000  ? HDL 40 02/28/2019 1044  ? CHOLHDL 4.3 02/28/2019 1044  ? LDLCALC 103 (H) 02/28/2019 1044  ? LDLDIRECT 115 (H) 02/28/2019 1044  ? ? ? ?  Physical Exam:   ? ?VS:  BP 120/78 (BP Location: Left Arm, Patient Position: Sitting, Cuff Size: Large)   Pulse (!) 49   Ht 6' (1.829 m)   Wt 287 lb 6 oz (130.4 kg)   SpO2 96%   BMI 38.98 kg/m?    ? ?Wt Readings from Last 3 Encounters:  ?05/11/21 287 lb 6 oz (130.4 kg)  ?05/01/21 285 lb (129.3 kg)  ?04/24/21 286 lb (129.7 kg)  ?  ? ?GEN:  Well nourished, well developed in no acute distress ?HEENT: Normal ?NECK: No JVD; No carotid bruits ?LYMPHATICS: No lymphadenopathy ?CARDIAC: bradycardic, RR, no murmurs, rubs, gallops ?RESPIRATORY:  Clear to auscultation without rales, wheezing or rhonchi  ?ABDOMEN: Soft, non-tender, non-distended ?MUSCULOSKELETAL:  No edema; No deformity  ?SKIN: Warm and dry ?NEUROLOGIC:  Alert and oriented x 3 ?PSYCHIATRIC:  Normal affect  ? ?ASSESSMENT:   ? ?1. Essential hypertension   ?2. Paroxysmal atrial fibrillation (HCC)   ?3. Chronic anticoagulation   ?4. Hemochromatosis, unspecified hemochromatosis type   ?5. OSA (obstructive sleep apnea)   ?6. Alcohol use   ? ?PLAN:   ? ?In order of problems listed above: ? ?Afib RVR s/p recent cardioversion ?EKG today shows sinus bradycardia with a heart rate of 49bpm with PACs. He denies any symtpoms. Immediately following cardioversion he went down to Toprol '50mg'$  dialy. He reports compliance with Xarelto. I will decrease Toprol to 12.'5mg'$  daily, he will call in two weeks to report heart rate. He is working on decreasing alcohol  consumption.  ? ?HTN ?BP good today. No changes ? ?Hemochromatosis ?Followed by heme/onc.  ? ?OSA ?Reports compliance with CPAP.  ? ?Alcohol use ?He is down to 1-2 glasses a couple times a week. Complete cessation a

## 2021-05-11 NOTE — Patient Instructions (Addendum)
Medication Instructions:  ?- Your physician has recommended you make the following change in your medication:  ? ?1) DECREASE Toprol XL (metoprolol succinate) to 25 mg: ?- take 1 tablet by mouth once daily  ? ?Please monitor your heart rates at home and call or send a MyChart message with these readings in the next couple of weeks ? ?*If you need a refill on your cardiac medications before your next appointment, please call your pharmacy* ? ? ?Lab Work: ?- none ordered ? ?If you have labs (blood work) drawn today and your tests are completely normal, you will receive your results only by: ?MyChart Message (if you have MyChart) OR ?A paper copy in the mail ?If you have any lab test that is abnormal or we need to change your treatment, we will call you to review the results. ? ? ?Testing/Procedures: ?- none ordered ? ? ?Follow-Up: ?At [facility], you and your health needs are our priority.  As part of our continuing mission to provide you with exceptional heart care, we have created designated Provider Care Teams.  These Care Teams include your primary Cardiologist (physician) and Advanced Practice Providers (APPs -  Physician Assistants and Nurse Practitioners) who all work together to provide you with the care you need, when you need it. ? ?We recommend signing up for the patient portal called "MyChart".  Sign up information is provided on this After Visit Summary.  MyChart is used to connect with patients for Virtual Visits (Telemedicine).  Patients are able to view lab/test results, encounter notes, upcoming appointments, etc.  Non-urgent messages can be sent to your provider as well.   ?To learn more about what you can do with MyChart, go to [email or web address].   ? ?Your next appointment:   ?3 month(s) ? ?The format for your next appointment:   ?In Person ? ?Provider:   ?You may see Ida Rogue, MD or one of the following Advanced Practice Providers on your designated Care Team:   ? ?Cadence Kathlen Mody,  PA-C  ? ? ?Other Instructions ?N/a ? ?Important Information About Sugar ? ? ? ? ? ? ?

## 2021-05-19 NOTE — Addendum Note (Signed)
Addended by: Britt Bottom on: 05/19/2021 10:54 AM ? ? Modules accepted: Orders ? ?

## 2021-05-22 ENCOUNTER — Ambulatory Visit: Payer: Medicare HMO | Admitting: Medical

## 2021-06-02 ENCOUNTER — Inpatient Hospital Stay: Payer: Medicare HMO

## 2021-06-02 ENCOUNTER — Inpatient Hospital Stay: Payer: Medicare HMO | Admitting: Nurse Practitioner

## 2021-06-05 ENCOUNTER — Encounter: Payer: Self-pay | Admitting: Cardiovascular Disease

## 2021-06-08 ENCOUNTER — Encounter: Payer: Self-pay | Admitting: Cardiovascular Disease

## 2021-06-08 ENCOUNTER — Ambulatory Visit: Payer: Medicare HMO | Admitting: Cardiovascular Disease

## 2021-06-08 ENCOUNTER — Other Ambulatory Visit
Admission: RE | Admit: 2021-06-08 | Discharge: 2021-06-08 | Disposition: A | Discharge status: Home / Self Care | Payer: Medicare HMO | Attending: Cardiovascular Disease | Admitting: Cardiovascular Disease

## 2021-06-08 VITALS — BP 110/80 | HR 143 | Ht 72.0 in | Wt 286.0 lb

## 2021-06-08 DIAGNOSIS — I10 Essential (primary) hypertension: Secondary | ICD-10-CM

## 2021-06-08 DIAGNOSIS — I480 Paroxysmal atrial fibrillation: Secondary | ICD-10-CM

## 2021-06-08 LAB — BASIC METABOLIC PANEL
Anion gap: 8 (ref 5–15)
BUN: 22 mg/dL — ABNORMAL HIGH (ref 6–20)
CO2: 25 mmol/L (ref 22–32)
Calcium: 9.8 mg/dL (ref 8.9–10.3)
Chloride: 106 mmol/L (ref 98–111)
Creatinine, Ser: 1.21 mg/dL (ref 0.61–1.24)
GFR, Estimated: >60 mL/min (ref >60)
Glucose, Bld: 164 mg/dL — ABNORMAL HIGH (ref 70–99)
Potassium: 4.4 mmol/L (ref 3.5–5.1)
Sodium: 139 mmol/L (ref 135–145)

## 2021-06-08 LAB — CBC
HCT: 53.4 % — ABNORMAL HIGH (ref 39.0–52.0)
Hemoglobin: 17.8 g/dL — ABNORMAL HIGH (ref 13.0–17.0)
MCH: 32 pg (ref 26.0–34.0)
MCHC: 33.3 g/dL (ref 30.0–36.0)
MCV: 96 fL (ref 80.0–100.0)
Platelets: 209 10*3/uL (ref 150–400)
RBC: 5.56 MIL/uL (ref 4.22–5.81)
RDW: 11.8 % (ref 11.5–15.5)
WBC: 7.4 10*3/uL (ref 4.0–10.5)
nRBC: 0 % (ref 0.0–0.2)

## 2021-06-08 MED ORDER — FLECAINIDE ACETATE 100 MG PO TABS
100.0000 mg | ORAL_TABLET | Freq: Two times a day (BID) | ORAL | 3 refills | Status: DC
Start: 2021-06-08 — End: 2021-12-15

## 2021-06-08 NOTE — Patient Instructions (Addendum)
Cardioversion on Thursday for atrial fib  Medication Instructions:  Please start flecainide 100 mg twice a day Increase the metoprolol up to 50 mg daily  Once heart rate drops, ok to decrease the metoprolol to 25 mg daily  If you need a refill on your cardiac medications before your next appointment, please call your pharmacy.   Lab work: Today: CBC, BMP at [place] at [facility] 1st desk on the right to check in (REGISTRATION)  Lab hours: Monday- Friday (7:30 am- 5:30 pm)   Testing/Procedures: You are scheduled for a Cardioversion on Thursday, May 25th with Dr.Gollan Please arrive at the [place] [facility] at 7:00 a.m. on the day of your procedure.  DIET INSTRUCTIONS:  Nothing to eat or drink after midnight except your medications with a sip of water.  Labs: CBC, BMP on 5/22  Medications:  YOU MAY TAKE ALL of your remaining medications with a small amount of water.  Must have a responsible person to drive you home.  Bring a current list of your medications and current insurance cards.    If you have any questions after you get home, please call the office at [phone]   Follow-Up: At [facility], you and your health needs are our priority.  As part of our continuing mission to provide you with exceptional heart care, we have created designated Provider Care Teams.  These Care Teams include your primary Cardiologist (physician) and Advanced Practice Providers (APPs -  Physician Assistants and Nurse Practitioners) who all work together to provide you with the care you need, when you need it.  You will need a follow up appointment in 1 month  Providers on your designated Care Team:   Murray Hodgkins, NP Christell Faith, PA-C Cadence Kathlen Mody, [place]  COVID-19 Vaccine Information can be found at: [email or web address] For questions related to vaccine distribution or appointments, please email  [email or web address] or call [phone].

## 2021-06-08 NOTE — Progress Notes (Signed)
Patient ID: Collin Holt, male   DOB: 1960-09-25, 61 y.o.   MRN: [id number] Cardiology Office Note  Date:  06/08/2021   ID:  Collin Holt, DOB December 18, 1960, MRN [id number]  PCP:  Collin Crouch, MD   Chief Complaint  Patient presents with   Atrial Fibrillation    Patient c/o shortness of breath, weakness and fatigue & dizziness. Medications reviewed by the patient verbally.     HPI:  Collin Holt is a 61 year old gentleman with  ETOH use Obesity, obstructive sleep apnea, long history of vertigo,  on disability for headaches and severe vertigo,  paroxysmal atrial fibrillation,   in the hospital July 2015 for atrial fibrillation. He was given amiodarone in the hospital and converted to normal sinus rhythm  Prior cardiac catheterization  2012 showing no significant CAD Prior echocardiogram 01/2010 showing normal ejection fraction  sleep apnea, compliant with his CPAP Hemochromatosis, donates a unit every 4 to 6 months He presents today for follow-up of his atrial fibrillation  Last seen in clinic 5/22 Seen 4/3/223 and was in rapid afib. Reported daily wine intake. Toprol was increased   Echo showed LVEF 55-60%, no WMA, mild LVH, normal RVSF, trivial MR, mild AI.   Seen back 04/24/21 and remained in Afib with rate 114bpm and he was set up for cardioversion.  Cardioversion by myself 05/01/2021, normal sinus rhythm restored  Drank red wine last Thursday, woke up in atrial fib Friday AM, rate was rapid Has remained rapid through the weekend, mildly symptomatic Reports compliance with his CPAP, machine is old though  Reports compliance with his Xarelto 20  Several years ago was taking amiodarone, this fell off his list  Other history reviewed Spends time, working on motorcycles On disability for severe vertigo History of hemochromatosis, followed by Collin Holt  CT scan abdomen from 2017 pulled up and reviewed no aortic atherosclerosis extending into the iliac vessels  Lab work  reviewed A1c 6.0 Normal LFTs Total cholesterol 168 LDL 101  EKG personally reviewed by myself on todays visit Shows atrial fibrillation rate 140 bpm  Other past medical history diagnosis of hemochromatosis, followed by Collin Holt Also told he has a fatty liver, needs to lose weight   on disability for continued severe vertigo when he lays down in bed, severe tinnitus Denies any significant shortness of breath or chest pain with exertion   PMH:   has a past medical history of Allergic rhinitis, Anxiety, B12 deficiency, Benign paroxysmal vertigo, Chronic headaches, Depression, Diabetes mellitus without complication ([place]), DOE (dyspnea on exertion), Dysrhythmia, Hereditary hemochromatosis ([place]), HTN (hypertension), OSA (obstructive sleep apnea), PAF (paroxysmal atrial fibrillation) ([place]), Plantar fibromatosis, Pneumonia, Sleep apnea, and Tinnitus.  PSH:    Past Surgical History:  Procedure Laterality Date   CARDIOVERSION N/A 05/01/2021   Procedure: CARDIOVERSION;  Surgeon: Collin Merritts, MD;  Location: [facility] ORS;  Service: Cardiovascular;  Laterality: N/A;   COLONOSCOPY WITH PROPOFOL N/A 07/23/2020   Procedure: COLONOSCOPY WITH PROPOFOL;  Surgeon: Collin Holt, Collin Pike, MD;  Location: [facility] END[facility]COPY;  Service: Gastroenterology;  Laterality: N/A;   SHOULDER SURGERY     Right x 2    Current Outpatient Medications  Medication Sig Dispense Refill   cyanocobalamin (,VITAMIN B-12,) 1000 MCG/ML injection Inject 1,000 mcg into the muscle every 14 (fourteen) days.     metoprolol succinate (TOPROL XL) 25 MG 24 hr tablet Take 1 tablet (25 mg) by mouth once daily. Take with or immediately following a meal. 90 tablet 3  ondansetron (ZOFRAN-ODT) 4 MG disintegrating tablet Take 4 mg by mouth every 8 (eight) hours as needed for nausea or vomiting.     rivaroxaban (XARELTO) 20 MG TABS tablet Take 1 tablet by mouth once daily with supper 90 tablet 1   SUMAtriptan (IMITREX) 100 MG tablet Take 100 mg by  mouth every 2 (two) hours as needed for migraine.     tadalafil (CIALIS) 20 MG tablet Take 20 mg by mouth daily as needed for erectile dysfunction.     venlafaxine XR (EFFEXOR-XR) 75 MG 24 hr capsule Take 75 mg by mouth daily with breakfast.     No current facility-administered medications for this visit.    Allergies:   Morphine and related and Terfenadine   Social History:  The patient  reports that he quit smoking about 22 years ago. His smoking use included cigarettes. He has a 10.00 pack-year smoking history. He quit smokeless tobacco use about 10 years ago. He reports current alcohol use of about 4.0 standard drinks per week. He reports that he does not use drugs.   Family History:   family history includes Cancer in his father; Emphysema in his father.    Review of Systems: Review of Systems  Constitutional: Negative.   Respiratory: Negative.    Cardiovascular: Negative.   Gastrointestinal: Negative.   Musculoskeletal: Negative.   Neurological: Negative.   Psychiatric/Behavioral: Negative.    All other systems reviewed and are negative.  PHYSICAL EXAM: VS:  BP 110/80 (BP Location: Left Arm, Patient Position: Sitting, Cuff Size: Normal)   Pulse (!) 143   Ht 6' (1.829 m)   Wt 286 lb (129.7 kg)   SpO2 98%   BMI 38.79 kg/m  , BMI Body mass index is 38.79 kg/m. Constitutional:  oriented to person, place, and time. No distress.  HENT:  Head: Grossly normal Eyes:  no discharge. No scleral icterus.  Neck: No JVD, no carotid bruits  Cardiovascular: Irregularly irregular, rapid no murmurs appreciated Pulmonary/Chest: Clear to auscultation bilaterally, no wheezes or rails Abdominal: Soft.  no distension.  no tenderness.  Musculoskeletal: Normal range of motion Neurological:  normal muscle tone. Coordination normal. No atrophy Skin: Skin warm and dry Psychiatric: normal affect, pleasant  Recent Labs: 02/03/2021: ALT 56 04/20/2021: Magnesium 2.0; TSH 2.930 04/24/2021: BUN 18;  Creatinine, Ser 1.12; Hemoglobin 16.3; Platelets 206; Potassium 4.3; Sodium 140    Lipid Panel Lab Results  Component Value Date   CHOL 171 02/28/2019   HDL 40 02/28/2019   LDLCALC 103 (H) 02/28/2019   TRIG 157 (H) 02/28/2019      Wt Readings from Last 3 Encounters:  06/08/21 286 lb (129.7 kg)  05/11/21 287 lb 6 oz (130.4 kg)  05/01/21 285 lb (129.3 kg)     ASSESSMENT AND PLAN:  Atrial fibrillation, unspecified type ([place]) - Back in atrial fibrillation with RVR today Continues to drink wine which she feels may have contributed Compliant with his xarelto 20 mg daily  Recommend increase metoprolol succinate 50 mg daily Start flecainide 100 twice daily Given his younger age we will try to avoid amiodarone May not be a good candidate for ablation given alcohol but can discuss this with EP  HYPERTENSION, BENIGN Higher dose metoprolol as above  Obstructive sleep apnea On CPAP " Cannot sleep without it" May need updated machine, he is talking with primary care  Morbid obesity due to excess calories ([place]) We have encouraged continued exercise, careful diet management in an effort to lose weight.  Hemachromatosis  Periodic blood phlebotomy  Collin Holt monitors Drawing blood every 2 months    Total encounter time more than 40 minutes  Greater than 50% was spent in counseling and coordination of care with the patient   No orders of the defined types were placed in this encounter.    Signed, Esmond Plants, M.D., Ph.D. 06/08/2021  [place], [place]

## 2021-06-09 ENCOUNTER — Encounter: Payer: Self-pay | Admitting: Cardiovascular Disease

## 2021-06-10 NOTE — Addendum Note (Signed)
Addended by: Anselm Pancoast on: 06/10/2021 09:16 AM   Modules accepted: Orders

## 2021-06-11 ENCOUNTER — Ambulatory Visit: Admission: RE | Admit: 2021-06-11 | Payer: Medicare HMO | Source: Home / Self Care | Admitting: Cardiovascular Disease

## 2021-06-11 ENCOUNTER — Encounter: Admission: RE | Payer: Self-pay | Source: Home / Self Care

## 2021-06-11 SURGERY — CARDIOVERSION
Anesthesia: General

## 2021-06-18 ENCOUNTER — Inpatient Hospital Stay: Payer: Medicare HMO

## 2021-06-18 ENCOUNTER — Inpatient Hospital Stay (HOSPITAL_BASED_OUTPATIENT_CLINIC_OR_DEPARTMENT_OTHER): Payer: Medicare HMO | Admitting: Nurse Practitioner

## 2021-06-18 ENCOUNTER — Encounter: Payer: Self-pay | Admitting: Nurse Practitioner

## 2021-06-18 ENCOUNTER — Inpatient Hospital Stay: Payer: Medicare HMO | Attending: Oncology

## 2021-06-18 DIAGNOSIS — R748 Abnormal levels of other serum enzymes: Secondary | ICD-10-CM | POA: Insufficient documentation

## 2021-06-18 DIAGNOSIS — I4891 Unspecified atrial fibrillation: Secondary | ICD-10-CM | POA: Diagnosis not present

## 2021-06-18 DIAGNOSIS — I10 Essential (primary) hypertension: Secondary | ICD-10-CM | POA: Insufficient documentation

## 2021-06-18 DIAGNOSIS — Z7901 Long term (current) use of anticoagulants: Secondary | ICD-10-CM | POA: Diagnosis not present

## 2021-06-18 LAB — IRON AND TIBC
Iron: 162 ug/dL (ref 45–182)
Saturation Ratios: 45 % — ABNORMAL HIGH (ref 17.9–39.5)
TIBC: 363 ug/dL (ref 250–450)
UIBC: 201 ug/dL

## 2021-06-18 LAB — CBC WITH DIFFERENTIAL/PLATELET
Abs Immature Granulocytes: 0.05 10*3/uL (ref 0.00–0.07)
Basophils Absolute: 0.1 10*3/uL (ref 0.0–0.1)
Basophils Relative: 1 %
Eosinophils Absolute: 0.1 10*3/uL (ref 0.0–0.5)
Eosinophils Relative: 1 %
HCT: 45.8 % (ref 39.0–52.0)
Hemoglobin: 15.6 g/dL (ref 13.0–17.0)
Immature Granulocytes: 1 %
Lymphocytes Relative: 34 %
Lymphs Abs: 2.7 10*3/uL (ref 0.7–4.0)
MCH: 33.3 pg (ref 26.0–34.0)
MCHC: 34.1 g/dL (ref 30.0–36.0)
MCV: 97.7 fL (ref 80.0–100.0)
Monocytes Absolute: 0.6 10*3/uL (ref 0.1–1.0)
Monocytes Relative: 8 %
Neutro Abs: 4.5 10*3/uL (ref 1.7–7.7)
Neutrophils Relative %: 55 %
Platelets: 178 10*3/uL (ref 150–400)
RBC: 4.69 MIL/uL (ref 4.22–5.81)
RDW: 11.8 % (ref 11.5–15.5)
WBC: 8 10*3/uL (ref 4.0–10.5)
nRBC: 0 % (ref 0.0–0.2)

## 2021-06-18 LAB — FERRITIN: Ferritin: 178 ng/mL (ref 24–336)

## 2021-06-18 NOTE — Progress Notes (Signed)
[place]  Telephone:(336) [phone] Fax:(336) [phone]  ID: Collin Holt OB: [birth year]  MR#: [id number]  GYF#:[id number]  Patient Care Team: [name], MD as PCP - General (Internal Medicine) Rockey Situ Kathlene November, MD as PCP - Cardiology (Cardiology) [name], MD as Consulting Physician (Cardiology) [name], MD as Consulting Physician (Oncology)  CHIEF COMPLAINT:  Hereditary hemochromatosis   INTERVAL HISTORY: Patient returns to clinic today for repeat laboratory work, further evaluation, and continuation of phlebotomy.  He continues to feel well and remains asymptomatic. Has been having a fib intermittently and recently underwent cardioversion. Now feels at baseline. He has no neurologic complaints.  He denies any weakness or fatigue.  He denies any recent fevers or illnesses. He has a good appetite and denies weight loss.  He has no chest pain, shortness of breath, cough, or hemoptysis.  He denies any nausea, vomiting, constipation, or diarrhea. He has no urinary complaints.  Patient feels at his baseline offers no specific complaints today.  REVIEW OF SYSTEMS:   Review of Systems  Constitutional: Negative.  Negative for fever, malaise/fatigue and weight loss.  Respiratory: Negative.  Negative for cough and shortness of breath.   Cardiovascular: Negative.  Negative for chest pain and leg swelling.  Gastrointestinal: Negative.  Negative for abdominal pain, blood in stool and melena.  Genitourinary: Negative.  Negative for dysuria.  Musculoskeletal: Negative.  Negative for back pain and myalgias.  Skin: Negative.  Negative for rash.  Neurological: Negative.  Negative for dizziness, weakness and headaches.  Psychiatric/Behavioral: Negative.  The patient is not nervous/anxious.   As per HPI. Otherwise, a complete review of systems is negative.  PAST MEDICAL HISTORY: Past Medical History:  Diagnosis Date   Allergic rhinitis    Anxiety     B12 deficiency    Benign paroxysmal vertigo    Chronic headaches    Depression    Diabetes mellitus without complication ([place])    DOE (dyspnea on exertion)    a. 02/2010 Cath: nl cors;  b. 02/2010 CPX: good fxnl capacity.   Dysrhythmia    Hereditary hemochromatosis ([place])    HTN (hypertension)    OSA (obstructive sleep apnea)    PAF (paroxysmal atrial fibrillation) ([place])    a. 2012 Echo: nl LV fxn, mod dil LA.   Plantar fibromatosis    Pneumonia    Sleep apnea    Tinnitus     PAST SURGICAL HISTORY: Past Surgical History:  Procedure Laterality Date   CARDIOVERSION N/A 05/01/2021   Procedure: CARDIOVERSION;  Surgeon: [name], MD;  Location: [facility];  Service: Cardiovascular;  Laterality: N/A;   COLONOSCOPY WITH PROPOFOL N/A 07/23/2020   Procedure: COLONOSCOPY WITH PROPOFOL;  Surgeon: Toledo, Benay Pike, MD;  Location: [facility];  Service: Gastroenterology;  Laterality: N/A;   SHOULDER SURGERY     Right x 2    FAMILY HISTORY Family History  Problem Relation Age of Onset   Emphysema Father    Cancer Father    Prostate cancer Neg Hx    Colon cancer Neg Hx    Coronary artery disease Neg Hx    Diabetes Neg Hx        ADVANCED DIRECTIVES:    HEALTH MAINTENANCE: Social History   Tobacco Use   Smoking status: Former    Packs/day: 1.00    Years: 10.00    Pack years: 10.00    Types: Cigarettes    Quit date: 01/19/1999    Years since quitting:  22.4   Smokeless tobacco: Former    Quit date: 01/19/2011  Vaping Use   Vaping Use: Never used  Substance Use Topics   Alcohol use: Yes    Alcohol/week: 4.0 standard drinks    Types: 4 Glasses of wine per week    Comment: daily   Drug use: No     Allergies  Allergen Reactions   Morphine And Related Nausea And Vomiting   Terfenadine Other (See Comments)    Irregular heartbeat Other reaction(s): GI Upset (intolerance)    Current Outpatient Medications  Medication Sig Dispense Refill   cyanocobalamin (,VITAMIN  B-12,) 1000 MCG/ML injection Inject 1,000 mcg into the muscle every 14 (fourteen) days.     flecainide (TAMBOCOR) 100 MG tablet Take 1 tablet (100 mg total) by mouth 2 (two) times daily. 60 tablet 3   metoprolol succinate (TOPROL XL) 25 MG 24 hr tablet Take 1 tablet (25 mg) by mouth once daily. Take with or immediately following a meal. 90 tablet 3   ondansetron (ZOFRAN-ODT) 4 MG disintegrating tablet Take 4 mg by mouth every 8 (eight) hours as needed for nausea or vomiting.     rivaroxaban (XARELTO) 20 MG TABS tablet Take 1 tablet by mouth once daily with supper 90 tablet 1   SUMAtriptan (IMITREX) 100 MG tablet Take 100 mg by mouth every 2 (two) hours as needed for migraine.     tadalafil (CIALIS) 20 MG tablet Take 20 mg by mouth daily as needed for erectile dysfunction.     venlafaxine XR (EFFEXOR-XR) 75 MG 24 hr capsule Take 75 mg by mouth daily with breakfast.     No current facility-administered medications for this visit.    OBJECTIVE: Vitals:   06/18/21 1324  BP: (!) 151/77  Pulse: (!) 56  Resp: 20  Temp: 98.7 F (37.1 C)  SpO2: 100%     Body mass index is 39.33 kg/m.    ECOG FS:0 - Asymptomatic  General: Well-developed, well-nourished, no acute distress. Eyes: Pink conjunctiva, anicteric sclera. Lungs: Clear to auscultation bilaterally.  No audible wheezing or coughing Heart: Regular rate and rhythm.  Abdomen: Soft, nontender, nondistended.  Musculoskeletal: No edema, cyanosis, or clubbing. Neuro: Alert, answering all questions appropriately. Cranial nerves grossly intact. Skin: No rashes or petechiae noted. Psych: Normal affect.   LAB RESULTS:  Lab Results  Component Value Date   NA 139 06/08/2021   K 4.4 06/08/2021   CL 106 06/08/2021   CO2 25 06/08/2021   GLUCOSE 164 (H) 06/08/2021   BUN 22 (H) 06/08/2021   CREATININE 1.21 06/08/2021   CALCIUM 9.8 06/08/2021   PROT 7.1 02/03/2021   ALBUMIN 4.2 02/03/2021   AST 44 (H) 02/03/2021   ALT 56 (H) 02/03/2021    ALKPHOS 39 02/03/2021   BILITOT 1.1 02/03/2021   GFRNONAA >60 06/08/2021   GFRAA 98 02/28/2019    Lab Results  Component Value Date   WBC 8.0 06/18/2021   NEUTROABS 4.5 06/18/2021   HGB 15.6 06/18/2021   HCT 45.8 06/18/2021   MCV 97.7 06/18/2021   PLT 178 06/18/2021   Lab Results  Component Value Date   FERRITIN 219 04/07/2021     STUDIES: No results found.  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  PLAN:    1. Hereditary hemochromatosis: Patient's hemoglobin and iron stores continue to be within normal limits.  Ferritin pending at time of visit. Was previously 40. Goal ferritin 50-100. Proceed with phlebotomy today 500 mL today.  RTC in 3 months for labs, further evaluation, and possible phlebotomy.  2. Atrial fibrillation: Regular rhythm today. Continue Xarelto as ordered.  3. Hypertension: BP elevated in clinic today. Post phlebotomy improved but still elevated. Follow up with cardiology. 4.  Elevated liver enzymes: bilirubin was elevated in February at 1.5. AST, ALT, alk phos were normal. Follow up with pcp for monitoring.   Disposition:  3 mo- lab (cbc, ferritin, iron studies), APP or Dr. Grayland Ormond, +/- phlebotomy- la  Patient expressed understanding that he can call or return to clinic at any time if he has any questions, concerns, or complaints.  Verlon Au, NP 06/18/2021

## 2021-06-18 NOTE — Patient Instructions (Signed)

## 2021-06-18 NOTE — Progress Notes (Signed)
Removed 500 ml of blood per Beckey Rutter NP. Pt tolerated procedure well. Declined a beverage or snack. VSS. Feeling well. Discharged to home.

## 2021-07-07 ENCOUNTER — Encounter: Payer: Self-pay | Admitting: Medical

## 2021-07-07 ENCOUNTER — Ambulatory Visit: Payer: Medicare HMO | Admitting: Medical

## 2021-07-07 VITALS — BP 140/78 | HR 65 | Ht 72.0 in | Wt 290.4 lb

## 2021-07-07 DIAGNOSIS — I480 Paroxysmal atrial fibrillation: Secondary | ICD-10-CM

## 2021-07-07 DIAGNOSIS — Z789 Other specified health status: Secondary | ICD-10-CM

## 2021-07-07 DIAGNOSIS — I10 Essential (primary) hypertension: Secondary | ICD-10-CM

## 2021-07-07 DIAGNOSIS — G4733 Obstructive sleep apnea (adult) (pediatric): Secondary | ICD-10-CM | POA: Diagnosis not present

## 2021-07-07 NOTE — Progress Notes (Signed)
Cardiology Office Note:    Date:  07/07/2021   ID:  Collin Holt, DOB Mar 13, 1960, MRN [id number]  PCP:  [name], MD  [facility] Cardiologist:  Ida Rogue, MD  [facility] Electrophysiologist:  None   Referring MD: [name], MD   Chief Complaint: 1 month follow-up AFib  History of Present Illness:    Collin Holt is a 61 y.o. male with a hx of PAF, EtOH use, obesity, OSA, vertigo, headaches, hemochromatosis followed by hematology, HTN who presents for echo follow-up.    He is a prior cardiac catheterization 2012 with no significant CAD.  Echocardiogram 01/2010 with normal LVEF.  He was hospitalized July 2015 for atrial fibrillation and given amiodarone and subsequently converted to sinus rhythm.  Echocardiogram 07/2013 with LVEF 50-55%, mild LVH.  Of note, he has had difficulty with affording his Xarelto in the past.   Seen 4/3/223 and was in rapid afib. Reported daily wine intake. Toprol was increased to '150mg'$  daily. Labs were drawn. Echo was ordered.    Echo showed LVEF 55-60%, no WMA, mild LVH, normal RVSF, trivial MR, mild AI.   Seen back 04/24/21 and remained in Afib with rate 114bpm and he was set up for cardioversion. HE underwent successful cardioversion 4/14 and was in sinus brady at follow-up 4/24. Toprol was decreased to 12.'5mg'$  daily.   He was seen 5/22 because he was back in afib, still dirnking alcohol. Metoprolol was increased and he was started on flecaninde '100mg'$  BID.   Today, EKG shows NSR wit heart rate of 65bpm, Qtc 461m. He is taking half the Toprol '25mg'$  pill daily. He overall is feeling good. He has some dizziness when he looks up while helping his friend on a house. He is still drinking 2-3 glasses. He would like to decrease. He denies chest pain or SOB. BP is a little high.   Past Medical History:  Diagnosis Date   Allergic rhinitis    Anxiety    B12 deficiency    Benign paroxysmal vertigo    Chronic headaches    Depression     Diabetes mellitus without complication ([place]    DOE (dyspnea on exertion)    a. 02/2010 Cath: nl cors;  b. 02/2010 CPX: good fxnl capacity.   Dysrhythmia    [place]reditary hemochromatosis ([place]    HTN (hypertension)    OSA (obstructive sleep apnea)    PAF (paroxysmal atrial fibrillation) ([place]    a. 2012 Echo: nl LV fxn, mod dil LA.   Plantar fibromatosis    Pneumonia    Sleep apnea    Tinnitus     Past Surgical History:  Procedure Laterality Date   CARDIOVERSION N/A 05/01/2021   Procedure: CARDIOVERSION;  Surgeon: GMinna Merritts MD;  Location: [facility];  Service: Cardiovascular;  Laterality: N/A;   COLONOSCOPY WITH PROPOFOL N/A 07/23/2020   Procedure: COLONOSCOPY WITH PROPOFOL;  Surgeon: Toledo, TBenay Pike MD;  Location: [facility];  Service: Gastroenterology;  Laterality: N/A;   SHOULDER SURGERY     Right x 2    Current Medications: Current Meds  Medication Sig   cyanocobalamin (,VITAMIN B-12,) 1000 MCG/ML injection Inject 1,000 mcg into the muscle every 14 (fourteen) days.   flecainide (TAMBOCOR) 100 MG tablet Take 1 tablet (100 mg total) by mouth 2 (two) times daily.   metoprolol succinate (TOPROL XL) 25 MG 24 hr tablet Take 1 tablet (25 mg) by mouth once daily. Take with or immediately following a meal.  ondansetron (ZOFRAN-ODT) 4 MG disintegrating tablet Take 4 mg by mouth every 8 (eight) hours as needed for nausea or vomiting.   rivaroxaban (XARELTO) 20 MG TABS tablet Take 1 tablet by mouth once daily with supper   SUMAtriptan (IMITREX) 100 MG tablet Take 100 mg by mouth every 2 (two) hours as needed for migraine.   tadalafil (CIALIS) 20 MG tablet Take 20 mg by mouth daily as needed for erectile dysfunction.   venlafaxine XR (EFFEXOR-XR) 75 MG 24 hr capsule Take 75 mg by mouth daily with breakfast.     Allergies:   Morphine and related and Terfenadine   Social History   Socioeconomic History   Marital status: Married    Spouse name: [name]   Number of children: Not  on file   Years of education: Not on file   Highest education level: Not on file  Occupational History   Occupation: TRUCK DRIVER    Employer: [employer]  Tobacco Use   Smoking status: Former    Packs/day: 1.00    Years: 10.00    Total pack years: 10.00    Types: Cigarettes    Quit date: 01/19/1999    Years since quitting: 22.4   Smokeless tobacco: Former    Quit date: 01/19/2011  Vaping Use   Vaping Use: Never used  Substance and Sexual Activity   Alcohol use: Yes    Alcohol/week: 4.0 standard drinks of alcohol    Types: 4 Glasses of wine per week    Comment: daily   Drug use: No   Sexual activity: Not on file  Other Topics Concern   Not on file  Social History Narrative   HSG   Married - '82 - 17 yrs/divorced; married '06. He has 4 daughters   Regular Exercise -  NO         Social Determinants of Health   Financial Resource Strain: Not on file  Food Insecurity: Not on file  Transportation Needs: Not on file  Physical Activity: Not on file  Stress: Not on file  Social Connections: Not on file     Family History: The patient's family history includes Cancer in his father; Emphysema in his father. There is no history of Prostate cancer, Colon cancer, Coronary artery disease, or Diabetes.  ROS:   Please see the history of present illness.     All other systems reviewed and are negative.  EKGs/Labs/Other Studies Reviewed:    The following studies were reviewed today:  Echo 04/22/21  1. Left ventricular ejection fraction, by estimation, is 55 to 60%. The  left ventricle has normal function. The left ventricle has no regional  wall motion abnormalities. There is mild left ventricular hypertrophy.  Left ventricular diastolic parameters  are indeterminate.   2. Right ventricular systolic function is normal. The right ventricular  size is normal.   3. The mitral valve is normal in structure. Trivial mitral valve  regurgitation.   4. The aortic valve is  tricuspid. Aortic valve regurgitation is mild.   5. The inferior vena cava is normal in size with <50% respiratory  variability, suggesting right atrial pressure of 8 mmHg.     EKG:  EKG is  ordered today.  The ekg ordered today demonstrates NSR 65bpm, LAD, PRI 151m, Qtc 4456m Nonspecific T wave changes.   Recent Labs: 02/03/2021: ALT 56 04/20/2021: Magnesium 2.0; TSH 2.930 06/08/2021: BUN 22; Creatinine, Ser 1.21; Potassium 4.4; Sodium 139 06/18/2021: Hemoglobin 15.6; Platelets 178  Recent Lipid  Panel    Component Value Date/Time   CHOL 171 02/28/2019 1044   TRIG 157 (H) 02/28/2019 1044   TRIG 392 10/23/2007 0000   HDL 40 02/28/2019 1044   CHOLHDL 4.3 02/28/2019 1044   LDLCALC 103 (H) 02/28/2019 1044   LDLDIRECT 115 (H) 02/28/2019 1044     Physical Exam:    VS:  BP 140/78 (BP Location: Left Arm, Patient Position: Sitting, Cuff Size: Large)   Pulse 65   Ht 6' (1.829 m)   Wt 290 lb 6.4 oz (131.7 kg)   BMI 39.39 kg/m     Wt Readings from Last 3 Encounters:  07/07/21 290 lb 6.4 oz (131.7 kg)  06/18/21 290 lb (131.5 kg)  06/08/21 286 lb (129.7 kg)     GEN:  Well nourished, well developed in no acute distress HEENT: Normal NECK: No JVD; No carotid bruits LYMPHATICS: No lymphadenopathy CARDIAC: RRR, no murmurs, rubs, gallops RESPIRATORY:  Clear to auscultation without rales, wheezing or rhonchi  ABDOMEN: Soft, non-tender, non-distended MUSCULOSKELETAL:  No edema; No deformity  SKIN: Warm and dry NEUROLOGIC:  Alert and oriented x 3 PSYCHIATRIC:  Normal affect   ASSESSMENT:    1. Paroxysmal atrial fibrillation (H[facility])   2. Essential hypertension   3. OSA (obstructive sleep apnea)   4. Morbid obesity ([place])   5. Hemochromatosis, unspecified hemochromatosis type   6. Alcohol use    PLAN:    In order of problems listed above:  Paroxysmal Afib s/p recently failed DCCV He was started on flecainide '100mg'$  BID at the last visit and is in NSR today with Qtc 438m. He is  overall feeling well. He is still drinking 2-3 glasses of wine nightly. Alcohol cessation stressed during the visit. He is taking Toprol 12.'5mg'$  daily. Continue stroke ppx with Xarelto '20mg'$  daily. We will refer to EP for possible ablation.   HTN BP is mildly elevated today. He is on Toprol 12.'5mg'$  daily, weary of increase due to previous bradycardia in sinus rhythm. Lifestyle changes discussed. He may need additional medication if BP continues to be high.   OSA He reports compliance with CPAP.  Morbid obesity Weight loss encouraged, lifestyle changes discussed in depth today.   Hemachromatosis He follows with heme/onc.   Alcohol abuse He is drinking 2-4 glasses of wine nightly. Complete cessation encouraged.   Disposition: Follow up in 3 month(s) with MD/APP    Signed, Collan Schoenfeld HNinfa Meeker PA-C  07/07/2021 8:52 AM    [facility]

## 2021-07-07 NOTE — Patient Instructions (Signed)
Medication Instructions:  Your physician recommends that you continue on your current medications as directed. Please refer to the Current Medication list given to you today.  *If you need a refill on your cardiac medications before your next appointment, please call your pharmacy*   Lab Work: None ordered If you have labs (blood work) drawn today and your tests are completely normal, you will receive your results only by: MyChart Message (if you have MyChart) OR A paper copy in the mail If you have any lab test that is abnormal or we need to change your treatment, we will call you to review the results.   Testing/Procedures: None ordered   Follow-Up: At [facility], you and your health needs are our priority.  As part of our continuing mission to provide you with exceptional heart care, we have created designated Provider Care Teams.  These Care Teams include your primary Cardiologist (physician) and Advanced Practice Providers (APPs -  Physician Assistants and Nurse Practitioners) who all work together to provide you with the care you need, when you need it.  We recommend signing up for the patient portal called "MyChart".  Sign up information is provided on this After Visit Summary.  MyChart is used to connect with patients for Virtual Visits (Telemedicine).  Patients are able to view lab/test results, encounter notes, upcoming appointments, etc.  Non-urgent messages can be sent to your provider as well.   To learn more about what you can do with MyChart, go to [email or web address]://[email or web address].com.    Your next appointment:   3 month(s)  The format for your next appointment:   In Person  Provider:   You may see Timothy Gollan, MD or one of the following Advanced Practice Providers on your designated Care Team:   Christopher Berge, NP Ryan Dunn, PA-C Cadence Furth, PA-C   Other Instructions N/A  Important Information About Sugar       

## 2021-07-08 NOTE — Addendum Note (Signed)
Addended by: Britt Bottom on: 07/08/2021 09:47 AM   Modules accepted: Orders

## 2021-07-28 DIAGNOSIS — Z03818 Encounter for observation for suspected exposure to other biological agents ruled out: Secondary | ICD-10-CM | POA: Diagnosis not present

## 2021-07-28 DIAGNOSIS — S30860A Insect bite (nonvenomous) of lower back and pelvis, initial encounter: Secondary | ICD-10-CM | POA: Diagnosis not present

## 2021-07-28 DIAGNOSIS — J019 Acute sinusitis, unspecified: Secondary | ICD-10-CM | POA: Diagnosis not present

## 2021-07-28 DIAGNOSIS — W57XXXA Bitten or stung by nonvenomous insect and other nonvenomous arthropods, initial encounter: Secondary | ICD-10-CM | POA: Diagnosis not present

## 2021-08-10 ENCOUNTER — Ambulatory Visit: Payer: Medicare HMO | Admitting: Cardiovascular Disease

## 2021-09-09 DIAGNOSIS — E669 Obesity, unspecified: Secondary | ICD-10-CM | POA: Diagnosis not present

## 2021-09-09 DIAGNOSIS — I480 Paroxysmal atrial fibrillation: Secondary | ICD-10-CM | POA: Diagnosis not present

## 2021-09-09 DIAGNOSIS — Z79899 Other long term (current) drug therapy: Secondary | ICD-10-CM | POA: Diagnosis not present

## 2021-09-09 DIAGNOSIS — Z125 Encounter for screening for malignant neoplasm of prostate: Secondary | ICD-10-CM | POA: Diagnosis not present

## 2021-09-09 DIAGNOSIS — Z6839 Body mass index (BMI) 39.0-39.9, adult: Secondary | ICD-10-CM | POA: Diagnosis not present

## 2021-09-09 DIAGNOSIS — Z0000 Encounter for general adult medical examination without abnormal findings: Secondary | ICD-10-CM | POA: Diagnosis not present

## 2021-09-09 DIAGNOSIS — E538 Deficiency of other specified B group vitamins: Secondary | ICD-10-CM | POA: Diagnosis not present

## 2021-09-09 DIAGNOSIS — I10 Essential (primary) hypertension: Secondary | ICD-10-CM | POA: Diagnosis not present

## 2021-09-09 DIAGNOSIS — R7309 Other abnormal glucose: Secondary | ICD-10-CM | POA: Diagnosis not present

## 2021-09-18 ENCOUNTER — Other Ambulatory Visit: Payer: Self-pay | Admitting: *Deleted

## 2021-09-22 ENCOUNTER — Inpatient Hospital Stay: Payer: Medicare HMO

## 2021-09-22 ENCOUNTER — Inpatient Hospital Stay: Payer: Medicare HMO | Admitting: Nurse Practitioner

## 2021-09-30 ENCOUNTER — Encounter: Payer: Self-pay | Admitting: Medical Oncology

## 2021-09-30 ENCOUNTER — Inpatient Hospital Stay: Payer: Medicare HMO | Attending: Oncology | Admitting: Medical Oncology

## 2021-09-30 ENCOUNTER — Inpatient Hospital Stay (HOSPITAL_BASED_OUTPATIENT_CLINIC_OR_DEPARTMENT_OTHER): Payer: Medicare HMO | Admitting: Nurse Practitioner

## 2021-09-30 ENCOUNTER — Inpatient Hospital Stay: Payer: Medicare HMO

## 2021-09-30 DIAGNOSIS — Z87891 Personal history of nicotine dependence: Secondary | ICD-10-CM | POA: Insufficient documentation

## 2021-09-30 DIAGNOSIS — I480 Paroxysmal atrial fibrillation: Secondary | ICD-10-CM

## 2021-09-30 DIAGNOSIS — I4891 Unspecified atrial fibrillation: Secondary | ICD-10-CM | POA: Insufficient documentation

## 2021-09-30 DIAGNOSIS — I10 Essential (primary) hypertension: Secondary | ICD-10-CM | POA: Diagnosis not present

## 2021-09-30 DIAGNOSIS — R748 Abnormal levels of other serum enzymes: Secondary | ICD-10-CM | POA: Diagnosis not present

## 2021-09-30 DIAGNOSIS — Z7901 Long term (current) use of anticoagulants: Secondary | ICD-10-CM | POA: Insufficient documentation

## 2021-09-30 LAB — CBC WITH DIFFERENTIAL/PLATELET
Abs Immature Granulocytes: 0.02 10*3/uL (ref 0.00–0.07)
Basophils Absolute: 0 10*3/uL (ref 0.0–0.1)
Basophils Relative: 1 %
Eosinophils Absolute: 0.2 10*3/uL (ref 0.0–0.5)
Eosinophils Relative: 3 %
HCT: 43.5 % (ref 39.0–52.0)
Hemoglobin: 15.1 g/dL (ref 13.0–17.0)
Immature Granulocytes: 0 %
Lymphocytes Relative: 27 %
Lymphs Abs: 1.5 10*3/uL (ref 0.7–4.0)
MCH: 33.2 pg (ref 26.0–34.0)
MCHC: 34.7 g/dL (ref 30.0–36.0)
MCV: 95.6 fL (ref 80.0–100.0)
Monocytes Absolute: 0.6 10*3/uL (ref 0.1–1.0)
Monocytes Relative: 11 %
Neutro Abs: 3.2 10*3/uL (ref 1.7–7.7)
Neutrophils Relative %: 58 %
Platelets: 168 10*3/uL (ref 150–400)
RBC: 4.55 MIL/uL (ref 4.22–5.81)
RDW: 11.4 % — ABNORMAL LOW (ref 11.5–15.5)
WBC: 5.4 10*3/uL (ref 4.0–10.5)
nRBC: 0 % (ref 0.0–0.2)

## 2021-09-30 LAB — IRON AND TIBC
Iron: 175 ug/dL (ref 45–182)
Saturation Ratios: 52 % — ABNORMAL HIGH (ref 17.9–39.5)
TIBC: 336 ug/dL (ref 250–450)
UIBC: 161 ug/dL

## 2021-09-30 LAB — FERRITIN: Ferritin: 336 ng/mL (ref 24–336)

## 2021-09-30 NOTE — Patient Instructions (Signed)

## 2021-09-30 NOTE — Progress Notes (Signed)
[name]  Telephone:(336) [phone] Fax:(336) [phone]  ID: Collin Holt OB: Feb 02, 1960  MR#: [id number]  GMW#:[id number]  Patient Care Team: [name], MD as PCP - General (Internal Medicine) Rockey Situ Kathlene November, MD as PCP - Cardiology (Cardiology) [name], MD as Consulting Physician (Cardiology) [name], MD as Consulting Physician (Oncology)  CHIEF COMPLAINT:  Hereditary hemochromatosis   INTERVAL HISTORY: Patient returns to clinic today for repeat laboratory work, further evaluation, and continuation of phlebotomy. His previous ferritin level was 178 on 06/18/2021 which has slowly been progressing down with phlebotomy every ~3 months from over 400. His CMP from outside source collected on 09/09/2021 showed a glucose of 211, and mild elevation of AST 41, ALT 67. This mild LFT elevation appears to be chronic for patient dating back until at least 2012. Normal creatinine on recent labs. Underwent abdominal US in 2022 which showed cirrhosis of the liver. Followed by Gertie Fey but he is unsure when his next follow up is. Also followed by cardiology for A Fib- doing well without recent occurrence.   He has no neurologic complaints.  He denies any weakness or fatigue.  He denies any recent fevers or illnesses. He has a good appetite and denies weight loss.  He has no chest pain, shortness of breath, cough, or hemoptysis.  He denies any nausea, vomiting, constipation, or diarrhea. He has no urinary complaints.  Patient feels at his baseline offers no specific complaints today.  REVIEW OF SYSTEMS:   Review of Systems  Constitutional: Negative.  Negative for fever, malaise/fatigue and weight loss.  Respiratory: Negative.  Negative for cough and shortness of breath.   Cardiovascular: Negative.  Negative for chest pain and leg swelling.  Gastrointestinal: Negative.  Negative for abdominal pain, blood in stool and melena.  Genitourinary: Negative.  Negative  for dysuria.  Musculoskeletal: Negative.  Negative for back pain and myalgias.  Skin: Negative.  Negative for rash.  Neurological: Negative.  Negative for dizziness, weakness and headaches.  Psychiatric/Behavioral: Negative.  The patient is not nervous/anxious.    As per HPI. Otherwise, a complete review of systems is negative.  PAST MEDICAL HISTORY: Past Medical History:  Diagnosis Date   Allergic rhinitis    Anxiety    B12 deficiency    Benign paroxysmal vertigo    Chronic headaches    Depression    Diabetes mellitus without complication ([name])    DOE (dyspnea on exertion)    a. 02/2010 Cath: nl cors;  b. 02/2010 CPX: good fxnl capacity.   Dysrhythmia    Hereditary hemochromatosis ([name])    HTN (hypertension)    OSA (obstructive sleep apnea)    PAF (paroxysmal atrial fibrillation) ([name])    a. 2012 Echo: nl LV fxn, mod dil LA.   Plantar fibromatosis    Pneumonia    Sleep apnea    Tinnitus     PAST SURGICAL HISTORY: Past Surgical History:  Procedure Laterality Date   CARDIOVERSION N/A 05/01/2021   Procedure: CARDIOVERSION;  Surgeon: [name], MD;  Location: [facility] ORS;  Service: Cardiovascular;  Laterality: N/A;   COLONOSCOPY WITH PROPOFOL N/A 07/23/2020   Procedure: COLONOSCOPY WITH PROPOFOL;  Surgeon: Toledo, Benay Pike, MD;  Location: [facility] ENDOSCOPY;  Service: Gastroenterology;  Laterality: N/A;   SHOULDER SURGERY     Right x 2    FAMILY HISTORY Family History  Problem Relation Age of Onset   Emphysema Father    Cancer Father    Prostate cancer Neg  Hx    Colon cancer Neg Hx    Coronary artery disease Neg Hx    Diabetes Neg Hx        ADVANCED DIRECTIVES:    HEALTH MAINTENANCE: Social History   Tobacco Use   Smoking status: Former    Packs/day: 1.00    Years: 10.00    Total pack years: 10.00    Types: Cigarettes    Quit date: 01/19/1999    Years since quitting: 22.7   Smokeless tobacco: Former    Quit date: 01/19/2011  Vaping Use   Vaping Use: Never  used  Substance Use Topics   Alcohol use: Yes    Alcohol/week: 4.0 standard drinks of alcohol    Types: 4 Glasses of wine per week    Comment: daily   Drug use: No     Allergies  Allergen Reactions   Morphine And Related Nausea And Vomiting   Terfenadine Other (See Comments)    Irregular heartbeat Other reaction(s): GI Upset (intolerance)    Current Outpatient Medications  Medication Sig Dispense Refill   cyanocobalamin (,VITAMIN B-12,) 1000 MCG/ML injection Inject 1,000 mcg into the muscle every 14 (fourteen) days.     flecainide (TAMBOCOR) 100 MG tablet Take 1 tablet (100 mg total) by mouth 2 (two) times daily. 60 tablet 3   metoprolol succinate (TOPROL XL) 25 MG 24 hr tablet Take 1 tablet (25 mg) by mouth once daily. Take with or immediately following a meal. 90 tablet 3   ondansetron (ZOFRAN-ODT) 4 MG disintegrating tablet Take 4 mg by mouth every 8 (eight) hours as needed for nausea or vomiting.     rivaroxaban (XARELTO) 20 MG TABS tablet Take 1 tablet by mouth once daily with supper 90 tablet 1   SUMAtriptan (IMITREX) 100 MG tablet Take 100 mg by mouth every 2 (two) hours as needed for migraine.     tadalafil (CIALIS) 20 MG tablet Take 20 mg by mouth daily as needed for erectile dysfunction.     venlafaxine XR (EFFEXOR-XR) 75 MG 24 hr capsule Take 75 mg by mouth daily with breakfast.     No current facility-administered medications for this visit.    OBJECTIVE: Vitals:   09/30/21 1337  BP: (!) 158/86  Pulse: (!) 58  Resp: 16  Temp: 97.6 F (36.4 C)  SpO2: 98%     Body mass index is 39.47 kg/m.    ECOG FS:0 - Asymptomatic  General: Well-developed, well-nourished, no acute distress. Eyes: Pink conjunctiva, anicteric sclera. Lungs: Clear to auscultation bilaterally.  No audible wheezing or coughing Heart: Regular rate and rhythm.  Abdomen: Soft, nontender, nondistended.  Musculoskeletal: No edema, cyanosis, or clubbing. Neuro: Alert, answering all questions  appropriately. Cranial nerves grossly intact. Skin: No rashes or petechiae noted. Psych: Normal affect.   LAB RESULTS:  Lab Results  Component Value Date   NA 139 06/08/2021   K 4.4 06/08/2021   CL 106 06/08/2021   CO2 25 06/08/2021   GLUCOSE 164 (H) 06/08/2021   BUN 22 (H) 06/08/2021   CREATININE 1.21 06/08/2021   CALCIUM 9.8 06/08/2021   PROT 7.1 02/03/2021   ALBUMIN 4.2 02/03/2021   AST 44 (H) 02/03/2021   ALT 56 (H) 02/03/2021   ALKPHOS 39 02/03/2021   BILITOT 1.1 02/03/2021   GFRNONAA >60 06/08/2021   GFRAA 98 02/28/2019    Lab Results  Component Value Date   WBC 5.4 09/30/2021   NEUTROABS 3.2 09/30/2021   HGB 15.1 09/30/2021   HCT  43.5 09/30/2021   MCV 95.6 09/30/2021   PLT 168 09/30/2021   Lab Results  Component Value Date   FERRITIN 178 06/18/2021     STUDIES: No results found.  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  PLAN:    1. Hereditary hemochromatosis: Chronic in nature. CBC shows no anemia or significant abnormality. He reports good tolerance of the phlebotomies. Goal ferritin 50-100. Trending down slowly with our efforts as we hoped. Proceed with phlebotomy today 500 mL today. RTC in 2.5 months for labs, further evaluation, and possible phlebotomy.  2. Atrial fibrillation: Regular rhythm today. Continue cardiology follow up as well as his Xeralto.  3. Hypertension: BP elevated in clinic today. This is chronic for patient at our office. Post phlebotomy improved but still elevated. Follow up with cardiology and PCP.  4.  Elevated liver enzymes: Chronic in nature for many years. Slightly overdue for repeat abdominal US. Reviewed recent LFTs by PCP which appear stable. Asked that he schedule follow up with gastroenterology which he is agreeable.   Disposition:  2.5 mo- lab (cbc, ferritin, iron studies), APP or Dr. Grayland Ormond, +/- phlebotomy- [place]  Patient expressed understanding that he can call or return to  clinic at any time if he has any questions, concerns, or complaints.  Hughie Closs, PA-C 09/30/2021

## 2021-09-30 NOTE — Progress Notes (Signed)
Therapeutic phlebotomy performed in right AC using 20g angiocath. 557m removed. Pt tolerated procedure well. Oral hydration provided. Vital signs stable at discharge.

## 2021-10-13 ENCOUNTER — Encounter: Payer: Self-pay | Admitting: Oncology

## 2021-10-14 ENCOUNTER — Ambulatory Visit: Payer: Medicare HMO | Admitting: Cardiovascular Disease

## 2021-12-01 ENCOUNTER — Other Ambulatory Visit: Payer: Medicare HMO

## 2021-12-01 ENCOUNTER — Ambulatory Visit: Payer: Medicare HMO | Admitting: Oncology

## 2021-12-15 ENCOUNTER — Inpatient Hospital Stay: Payer: Medicare HMO | Attending: Oncology

## 2021-12-15 ENCOUNTER — Encounter: Payer: Self-pay | Admitting: Oncology

## 2021-12-15 ENCOUNTER — Inpatient Hospital Stay: Payer: Medicare HMO

## 2021-12-15 ENCOUNTER — Inpatient Hospital Stay (HOSPITAL_BASED_OUTPATIENT_CLINIC_OR_DEPARTMENT_OTHER): Payer: Medicare HMO | Admitting: Oncology

## 2021-12-15 DIAGNOSIS — R7401 Elevation of levels of liver transaminase levels: Secondary | ICD-10-CM | POA: Insufficient documentation

## 2021-12-15 DIAGNOSIS — I480 Paroxysmal atrial fibrillation: Secondary | ICD-10-CM | POA: Diagnosis not present

## 2021-12-15 DIAGNOSIS — Z7901 Long term (current) use of anticoagulants: Secondary | ICD-10-CM | POA: Insufficient documentation

## 2021-12-15 DIAGNOSIS — I10 Essential (primary) hypertension: Secondary | ICD-10-CM | POA: Insufficient documentation

## 2021-12-15 LAB — COMPREHENSIVE METABOLIC PANEL
ALT: 65 U/L — ABNORMAL HIGH (ref 0–44)
AST: 52 U/L — ABNORMAL HIGH (ref 15–41)
Albumin: 3.9 g/dL (ref 3.5–5.0)
Alkaline Phosphatase: 43 U/L (ref 38–126)
Anion gap: 7 (ref 5–15)
BUN: 16 mg/dL (ref 6–20)
CO2: 23 mmol/L (ref 22–32)
Calcium: 8.7 mg/dL — ABNORMAL LOW (ref 8.9–10.3)
Chloride: 102 mmol/L (ref 98–111)
Creatinine, Ser: 0.86 mg/dL (ref 0.61–1.24)
GFR, Estimated: >60 mL/min (ref >60)
Glucose, Bld: 263 mg/dL — ABNORMAL HIGH (ref 70–99)
Potassium: 3.9 mmol/L (ref 3.5–5.1)
Sodium: 132 mmol/L — ABNORMAL LOW (ref 135–145)
Total Bilirubin: 1.1 mg/dL (ref 0.3–1.2)
Total Protein: 7.3 g/dL (ref 6.5–8.1)

## 2021-12-15 LAB — CBC WITH DIFFERENTIAL/PLATELET
Abs Immature Granulocytes: 0.01 10*3/uL (ref 0.00–0.07)
Basophils Absolute: 0.1 10*3/uL (ref 0.0–0.1)
Basophils Relative: 1 %
Eosinophils Absolute: 0.2 10*3/uL (ref 0.0–0.5)
Eosinophils Relative: 4 %
HCT: 47.4 % (ref 39.0–52.0)
Hemoglobin: 16.2 g/dL (ref 13.0–17.0)
Immature Granulocytes: 0 %
Lymphocytes Relative: 31 %
Lymphs Abs: 1.4 10*3/uL (ref 0.7–4.0)
MCH: 32.7 pg (ref 26.0–34.0)
MCHC: 34.2 g/dL (ref 30.0–36.0)
MCV: 95.6 fL (ref 80.0–100.0)
Monocytes Absolute: 0.4 10*3/uL (ref 0.1–1.0)
Monocytes Relative: 9 %
Neutro Abs: 2.5 10*3/uL (ref 1.7–7.7)
Neutrophils Relative %: 55 %
Platelets: 167 10*3/uL (ref 150–400)
RBC: 4.96 MIL/uL (ref 4.22–5.81)
RDW: 11.5 % (ref 11.5–15.5)
WBC: 4.6 10*3/uL (ref 4.0–10.5)
nRBC: 0 % (ref 0.0–0.2)

## 2021-12-15 LAB — IRON AND TIBC
Iron: 191 ug/dL — ABNORMAL HIGH (ref 45–182)
Saturation Ratios: 55 % — ABNORMAL HIGH (ref 17.9–39.5)
TIBC: 349 ug/dL (ref 250–450)
UIBC: 158 ug/dL

## 2021-12-15 LAB — FERRITIN: Ferritin: 336 ng/mL (ref 24–336)

## 2021-12-15 NOTE — Patient Instructions (Signed)

## 2021-12-15 NOTE — Progress Notes (Signed)
Pt doing well. No concerns.

## 2021-12-15 NOTE — Progress Notes (Signed)
[place]  Telephone:(336) [phone] Fax:(336) [phone]  ID: Collin Holt OB: [birth year]  MR#: [id number]  VEH#:[id number]  Patient Care Team: [name], MD as PCP - General (Internal Medicine) Rockey Situ Kathlene November, MD as PCP - Cardiology (Cardiology) [name], MD as Consulting Physician (Cardiology) [name], MD as Consulting Physician (Oncology)  CHIEF COMPLAINT:  Hereditary hemochromatosis   INTERVAL HISTORY: Patient returns to clinic today for routine 4-monthevaluation and continuation of phlebotomy.  He continues to feel well and remains asymptomatic. He has no neurologic complaints.  He denies any weakness or fatigue.  He denies any recent fevers or illnesses. He has a good appetite and denies weight loss.  He has no chest pain, shortness of breath, cough, or hemoptysis.  He denies any nausea, vomiting, constipation, or diarrhea. He has no urinary complaints.  Patient feels at his baseline offers no specific complaints today.  REVIEW OF SYSTEMS:   Review of Systems  Constitutional: Negative.  Negative for fever, malaise/fatigue and weight loss.  Respiratory: Negative.  Negative for cough and shortness of breath.   Cardiovascular: Negative.  Negative for chest pain and leg swelling.  Gastrointestinal: Negative.  Negative for abdominal pain, blood in stool and melena.  Genitourinary: Negative.  Negative for dysuria.  Musculoskeletal: Negative.  Negative for back pain and myalgias.  Skin: Negative.  Negative for rash.  Neurological: Negative.  Negative for dizziness, weakness and headaches.  Psychiatric/Behavioral: Negative.  The patient is not nervous/anxious.     As per HPI. Otherwise, a complete review of systems is negative.  PAST MEDICAL HISTORY: Past Medical History:  Diagnosis Date   Allergic rhinitis    Anxiety    B12 deficiency    Benign paroxysmal vertigo    Chronic headaches    Depression    Diabetes [place]  complication [place]    DOE (dyspnea on exertion)    a. 02/2010 Cath: nl cors;  b. 02/2010 CPX: good fxnl capacity.   Dysrhythmia    [place]    HTN (hypertension)    OSA (obstructive sleep apnea)    PAF (paroxysmal [place] fibrillation) ([place]    a. 2012 Echo: nl LV fxn, mod dil LA.   Plantar fibromatosis    Pneumonia    Sleep apnea    Tinnitus     PAST SURGICAL HISTORY: Past Surgical History:  Procedure Laterality Date   CARDIOVERSION N/A 05/01/2021   Procedure: CARDIOVERSION;  Surgeon: GMinna Merritts MD;  Location: [facility] ORS;  Service: Cardiovascular;  Laterality: N/A;   COLONOSCOPY WITH PROPOFOL N/A 07/23/2020   Procedure: COLONOSCOPY WITH PROPOFOL;  Surgeon: Toledo, TBenay Pike MD;  Location: [facility] ENDOS[facility];  Service: Gastroenterology;  Laterality: N/A;   SHOULDER SURGERY     Right x 2    FAMILY HISTORY Family History  Problem Relation Age of Onset   Emphysema Father    Cancer Father    Prostate cancer Neg Hx    Colon cancer Neg Hx    Coronary artery disease Neg Hx    Diabetes Neg Hx        ADVANCED DIRECTIVES:    HEALTH MAINTENANCE: Social History   Tobacco Use   Smoking status: Former    Packs/day: 1.00    Years: 10.00    Total pack years: 10.00    Types: Cigarettes    Quit date: 01/19/1999    Years since quitting: 22.9   Smokeless tobacco: Former    Quit date: 01/19/2011  Vaping Use   Vaping Use: Never used  Substance Use Topics   Alcohol use: Yes    Alcohol/week: 4.0 standard drinks of alcohol    Types: 4 Glasses of wine per week    Comment: daily   Drug use: No     Allergies  Allergen Reactions   Morphine And Related Nausea And Vomiting   Terfenadine Other (See Comments)    Irregular heartbeat Other reaction(s): GI Upset (intolerance)    Current Outpatient Medications  Medication Sig Dispense Refill   cyanocobalamin (,VITAMIN B-12,) 1000 MCG/ML injection Inject 1,000 mcg into the muscle every 14 (fourteen) days.      metoprolol succinate (TOPROL XL) 25 MG 24 hr tablet Take 1 tablet (25 mg) by mouth once daily. Take with or immediately following a meal. 90 tablet 3   rivaroxaban (XARELTO) 20 MG TABS tablet Take 1 tablet by mouth once daily with supper 90 tablet 1   SUMAtriptan (IMITREX) 100 MG tablet Take 100 mg by mouth every 2 (two) hours as needed for migraine.     tadalafil (CIALIS) 20 MG tablet Take 20 mg by mouth daily as needed for erectile dysfunction.     venlafaxine XR (EFFEXOR-XR) 75 MG 24 hr capsule Take 75 mg by mouth daily with breakfast.     ondansetron (ZOFRAN-ODT) 4 MG disintegrating tablet Take 4 mg by mouth every 8 (eight) hours as needed for nausea or vomiting. (Patient not taking: Reported on 12/15/2021)     No current facility-administered medications for this visit.    OBJECTIVE: Vitals:   12/15/21 1308  BP: (!) 162/93  Pulse: 64  Resp: 18  Temp: (!) 96.3 F (35.7 C)  SpO2: 98%     Body mass index is 39.32 kg/m.    ECOG FS:0 - Asymptomatic  General: Well-developed, well-nourished, no acute distress. Eyes: Pink conjunctiva, anicteric sclera. HEENT: Normocephalic, moist mucous membranes. Lungs: No audible wheezing or coughing. Heart: Regular rate and rhythm. Abdomen: Soft, nontender, no obvious distention. Musculoskeletal: No edema, cyanosis, or clubbing. Neuro: Alert, answering all questions appropriately. Cranial nerves grossly intact. Skin: No rashes or petechiae noted. Psych: Normal affect.  LAB RESULTS:  Lab Results  Component Value Date   NA 132 (L) 12/15/2021   K 3.9 12/15/2021   CL 102 12/15/2021   CO2 23 12/15/2021   GLUCOSE 263 (H) 12/15/2021   BUN 16 12/15/2021   CREATININE 0.86 12/15/2021   CALCIUM 8.7 (L) 12/15/2021   PROT 7.3 12/15/2021   ALBUMIN 3.9 12/15/2021   AST 52 (H) 12/15/2021   ALT 65 (H) 12/15/2021   ALKPHOS 43 12/15/2021   BILITOT 1.1 12/15/2021   GFRNONAA >60 12/15/2021   GFRAA 98 02/28/2019    Lab Results  Component Value Date    WBC 4.6 12/15/2021   NEUTROABS 2.5 12/15/2021   HGB 16.2 12/15/2021   HCT 47.4 12/15/2021   MCV 95.6 12/15/2021   PLT 167 12/15/2021   Lab Results  Component Value Date   FERRITIN 336 09/30/2021     STUDIES: No results found.  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  PLAN:    1. Hereditary hemochromatosis: Patient's hemoglobin and iron stores continue to be within normal limits.  His most recent ferritin continues to be mildly elevated at 336. Goal ferritin is 50-100.  Proceed with 500 mL phlebotomy today.  Return to clinic in 2 and 4 months for lab and phlebotomy only.  Patient will then return to clinic in 6 months  with repeat laboratory work, further evaluation, and continuation of treatment if needed. 2. Atrial fibrillation: Continue Xarelto as ordered. 3. Hypertension: Blood pressure mildly elevated.  Continue monitoring and treatment per primary care. 4.  Transaminitis: Chronic and unchanged.  Monitor.  Patient expressed understanding that he can call or return to clinic at any time if he has any questions, concerns, or complaints.  [name], MD   12/15/2021 1:31 PM

## 2021-12-23 ENCOUNTER — Ambulatory Visit: Payer: Medicare HMO | Admitting: Cardiovascular Disease

## 2021-12-29 ENCOUNTER — Ambulatory Visit: Payer: Medicare HMO | Admitting: Cardiovascular Disease

## 2022-01-01 ENCOUNTER — Other Ambulatory Visit: Payer: Self-pay | Admitting: Cardiovascular Disease

## 2022-01-01 DIAGNOSIS — Z7901 Long term (current) use of anticoagulants: Secondary | ICD-10-CM

## 2022-01-01 DIAGNOSIS — I480 Paroxysmal atrial fibrillation: Secondary | ICD-10-CM

## 2022-01-01 NOTE — Telephone Encounter (Signed)
Prescription refill request for Xarelto received.  Indication:afib Last office visit:6/23 Weight:131.5  kg Age:61 Scr:0.8 CrCl:182.64  ml/min  Prescription refilled

## 2022-01-01 NOTE — Telephone Encounter (Signed)
Please review

## 2022-01-21 DIAGNOSIS — R030 Elevated blood-pressure reading, without diagnosis of hypertension: Secondary | ICD-10-CM | POA: Diagnosis not present

## 2022-01-21 DIAGNOSIS — R7309 Other abnormal glucose: Secondary | ICD-10-CM | POA: Diagnosis not present

## 2022-01-21 DIAGNOSIS — G43709 Chronic migraine without aura, not intractable, without status migrainosus: Secondary | ICD-10-CM | POA: Diagnosis not present

## 2022-01-21 DIAGNOSIS — Z6839 Body mass index (BMI) 39.0-39.9, adult: Secondary | ICD-10-CM | POA: Diagnosis not present

## 2022-02-08 ENCOUNTER — Other Ambulatory Visit: Payer: Self-pay | Admitting: Medical

## 2022-02-08 DIAGNOSIS — I480 Paroxysmal atrial fibrillation: Secondary | ICD-10-CM

## 2022-02-08 NOTE — Progress Notes (Signed)
Patient ID: Collin Holt, male   DOB: 06-22-1960, 62 y.o.   MRN: [id number] Cardiology Office Note  Date:  02/09/2022   ID:  Collin Holt, DOB Nov 15, 1960, MRN [id number]  PCP:  [name], MD   Chief Complaint  Patient presents with   3 month follow up     "Doing well." Medications reviewed by the patient verbally.     HPI:  Collin Holt is a 62 year old gentleman with  ETOH use Obesity, obstructive sleep apnea, long history of vertigo,  on disability for headaches and severe vertigo,  paroxysmal atrial fibrillation,  Prior cardiac catheterization  2012 showing no significant CAD Prior echocardiogram 01/2010 showing normal ejection fraction hospital July 2015 for atrial fibrillation. He was given amiodarone in the hospital and converted to normal sinus rhythm   sleep apnea, compliant with his CPAP Hemochromatosis, donates unit every 4 to 6 months He presents today for follow-up of his atrial fibrillation  Last seen in clinic 5/23 Seen 4/3/223 and was in rapid afib.  Cardioversion, then back in afib 5/23 At that point metoprolol was continued, started on flecainide, was referred to EP but never made appointment  In follow-up today reports he is predominantly normal sinus rhythm, rare palpitations Compliant with his Xarelto Not taking flecainide on a regular basis, may take this as needed  Blood pressure elevated on today's visit, reports he is cutting his metoprolol succinate in half daily taking 12.5, not the full tablet 25 Does not check blood pressure at home  EKG personally reviewed by myself on todays visit Normal sinus rhythm rate 63 bpm no significant ST-T wave changes   Other past medical history reviewed Echo showed LVEF 55-60%, no WMA, mild LVH, normal RVSF, trivial MR, mild AI.   Seen 04/24/21 in Afib with rate 114bpm and he was set up for cardioversion.  Cardioversion  05/01/2021, normal sinus rhythm restored  Reports compliance with his CPAP, machine is  old though  Reports compliance with his Xarelto 20  Several years ago was taking amiodarone, this fell off his list  Spends time, working on motorcycles On disability for severe vertigo, headaches History of hemochromatosis, followed by Dr. Grayland Ormond  CT scan abdomen from 2017 pulled up and reviewed no aortic atherosclerosis extending into the iliac vessels  diagnosis of hemochromatosis, followed by Dr. Grayland Ormond Also told he has a fatty liver, needs to lose weight   on disability for continued severe vertigo when he lays down in bed, severe tinnitus Denies any significant shortness of breath or chest pain with exertion   PMH:   has a past medical history of Allergic rhinitis, Anxiety, B12 deficiency, Benign paroxysmal vertigo, Chronic headaches, Depression, Diabetes mellitus without complication ([place]), DOE (dyspnea on exertion), Dysrhythmia, Hereditary hemochromatosis ([place]), HTN (hypertension), OSA (obstructive sleep apnea), PAF (paroxysmal atrial fibrillation) ([place]), Plantar fibromatosis, Pneumonia, Sleep apnea, and Tinnitus.  PSH:    Past Surgical History:  Procedure Laterality Date   CARDIOVERSION N/A 05/01/2021   Procedure: CARDIOVERSION;  Surgeon: [name], MD;  Location: [facility] ORS;  Service: Cardiovascular;  Laterality: N/A;   COLONOSCOPY WITH PROPOFOL N/A 07/23/2020   Procedure: COLONOSCOPY WITH PROPOFOL;  Surgeon: Toledo, Benay Pike, MD;  Location: [facility] ENDOSCOPY;  Service: Gastroenterology;  Laterality: N/A;   SHOULDER SURGERY     Right x 2    Current Outpatient Medications  Medication Sig Dispense Refill   azelastine (ASTELIN) 0.1 % nasal spray Place 1 spray into both nostrils 2 (two) times daily.  cyanocobalamin (,VITAMIN B-12,) 1000 MCG/ML injection Inject 1,000 mcg into the muscle every 14 (fourteen) days.     flecainide (TAMBOCOR) 100 MG tablet Take 1 tablet (100 mg total) by mouth 2 (two) times daily as needed. 180 tablet 3   meclizine (ANTIVERT) 25 MG tablet  Take 25 mg by mouth 3 (three) times daily as needed.     metFORMIN (GLUCOPHAGE) 500 MG tablet Take 500 mg by mouth 2 (two) times daily with a meal.     metoprolol succinate (TOPROL XL) 25 MG 24 hr tablet Take 1 tablet (25 mg) by mouth once daily. Take with or immediately following a meal. 90 tablet 3   rivaroxaban (XARELTO) 20 MG TABS tablet TAKE ONE TABLET BY MOUTH ONE TIME DAILY WITH SUPPER 90 tablet 1   SUMAtriptan (IMITREX) 100 MG tablet Take 100 mg by mouth every 2 (two) hours as needed for migraine.     tadalafil (CIALIS) 20 MG tablet Take 20 mg by mouth daily as needed for erectile dysfunction.     venlafaxine XR (EFFEXOR-XR) 75 MG 24 hr capsule Take 75 mg by mouth daily with breakfast.     ondansetron (ZOFRAN-ODT) 4 MG disintegrating tablet Take 4 mg by mouth every 8 (eight) hours as needed for nausea or vomiting. (Patient not taking: Reported on 12/15/2021)     No current facility-administered medications for this visit.    Allergies:   Morphine and related and Terfenadine   Social History:  The patient  reports that he quit smoking about 23 years ago. His smoking use included cigarettes. He has a 10.00 pack-year smoking history. He quit smokeless tobacco use about 11 years ago. He reports current alcohol use of about 4.0 standard drinks of alcohol per week. He reports that he does not use drugs.   Family History:   family history includes Cancer in his father; Emphysema in his father.    Review of Systems: Review of Systems  Constitutional: Negative.   Respiratory: Negative.    Cardiovascular: Negative.   Gastrointestinal: Negative.   Musculoskeletal: Negative.   Neurological: Negative.   Psychiatric/Behavioral: Negative.    All other systems reviewed and are negative.   PHYSICAL EXAM: VS:  BP (!) 155/90 (BP Location: Left Arm)   Pulse 63   Ht 6' (1.829 m)   Wt 289 lb 4 oz (131.2 kg)   SpO2 98%   BMI 39.23 kg/m  , BMI Body mass index is 39.23 kg/m. Constitutional:   oriented to person, place, and time. No distress.  HENT:  Head: Grossly normal Eyes:  no discharge. No scleral icterus.  Neck: No JVD, no carotid bruits  Cardiovascular: Irregularly irregular, rapid no murmurs appreciated Pulmonary/Chest: Clear to auscultation bilaterally, no wheezes or rails Abdominal: Soft.  no distension.  no tenderness.  Musculoskeletal: Normal range of motion Neurological:  normal muscle tone. Coordination normal. No atrophy Skin: Skin warm and dry Psychiatric: normal affect, pleasant  Recent Labs: 04/20/2021: Magnesium 2.0; TSH 2.930 12/15/2021: ALT 65; BUN 16; Creatinine, Ser 0.86; Hemoglobin 16.2; Platelets 167; Potassium 3.9; Sodium 132    Lipid Panel Lab Results  Component Value Date   CHOL 171 02/28/2019   HDL 40 02/28/2019   LDLCALC 103 (H) 02/28/2019   TRIG 157 (H) 02/28/2019      Wt Readings from Last 3 Encounters:  02/09/22 289 lb 4 oz (131.2 kg)  12/15/21 289 lb 14.4 oz (131.5 kg)  09/30/21 291 lb (132 kg)     ASSESSMENT AND PLAN:  Atrial fibrillation, unspecified type ([place]) - Recommend alcohol cessation Recommend increase metoprolol succinate up to 25 mg daily, he was taking half pill Suggest he closely monitor blood pressure which is elevated today even on recheck He would prefer not to add additional medications today for blood pressure Suggested also with blood pressure measurements 2 hours after he takes his morning metoprolol Flecainide as needed for arrhythmia Stay on Xarelto  HYPERTENSION, BENIGN Increase metoprolol up to 25 daily, monitor blood pressure at home and call us with numbers  Obstructive sleep apnea On CPAP, " Cannot sleep without it" Risk factor for atrial fibrillation  Morbid obesity due to excess calories (HCC) Recommend exercise and low carbohydrate diet  Hemachromatosis Periodic blood phlebotomy  Dr. Grayland Ormond monitors Drawing blood every 2 months    Total encounter time more than 30 minutes  Greater  than 50% was spent in counseling and coordination of care with the patient   Orders Placed This Encounter  Procedures   EKG 12-Lead     Signed, Esmond Plants, M.D., Ph.D. 02/09/2022  [place], [place]

## 2022-02-09 ENCOUNTER — Ambulatory Visit: Payer: Medicare HMO | Attending: Cardiovascular Disease | Admitting: Cardiovascular Disease

## 2022-02-09 ENCOUNTER — Encounter: Payer: Self-pay | Admitting: Cardiovascular Disease

## 2022-02-09 VITALS — BP 155/90 | HR 63 | Ht 72.0 in | Wt 289.2 lb

## 2022-02-09 DIAGNOSIS — Z789 Other specified health status: Secondary | ICD-10-CM | POA: Diagnosis not present

## 2022-02-09 DIAGNOSIS — I10 Essential (primary) hypertension: Secondary | ICD-10-CM

## 2022-02-09 DIAGNOSIS — G4733 Obstructive sleep apnea (adult) (pediatric): Secondary | ICD-10-CM

## 2022-02-09 DIAGNOSIS — I480 Paroxysmal atrial fibrillation: Secondary | ICD-10-CM | POA: Diagnosis not present

## 2022-02-09 DIAGNOSIS — Z7901 Long term (current) use of anticoagulants: Secondary | ICD-10-CM | POA: Diagnosis not present

## 2022-02-09 NOTE — Patient Instructions (Addendum)
Medication Instructions:  Please increase the metoprolol up to 25 mg daily (whole pill)  Flecainide as needed for breakthrough atrial fibrillation  Monitor blood pressure at home, call with numbers  If you need a refill on your cardiac medications before your next appointment, please call your pharmacy.   Lab work: No new labs needed  Testing/Procedures: No new testing needed  Follow-Up: At [facility], you and your health needs are our priority.  As part of our continuing mission to provide you with exceptional heart care, we have created designated Provider Care Teams.  These Care Teams include your primary Cardiologist (physician) and Advanced Practice Providers (APPs -  Physician Assistants and Nurse Practitioners) who all work together to provide you with the care you need, when you need it.  You will need a follow up appointment in 12 months  Providers on your designated Care Team:   Murray Hodgkins, NP Christell Faith, PA-C Cadence Kathlen Mody, [place]  COVID-19 Vaccine Information can be found at: [email or web address] For questions related to vaccine distribution or appointments, please email [email or web address] or call [phone].

## 2022-02-12 ENCOUNTER — Other Ambulatory Visit: Payer: Self-pay | Admitting: *Deleted

## 2022-02-15 ENCOUNTER — Inpatient Hospital Stay: Payer: Medicare HMO

## 2022-02-15 ENCOUNTER — Inpatient Hospital Stay: Payer: Medicare HMO | Attending: Oncology

## 2022-02-15 LAB — CBC WITH DIFFERENTIAL/PLATELET
Abs Immature Granulocytes: 0.02 10*3/uL (ref 0.00–0.07)
Basophils Absolute: 0.1 10*3/uL (ref 0.0–0.1)
Basophils Relative: 1 %
Eosinophils Absolute: 0.3 10*3/uL (ref 0.0–0.5)
Eosinophils Relative: 4 %
HCT: 45.3 % (ref 39.0–52.0)
Hemoglobin: 15.6 g/dL (ref 13.0–17.0)
Immature Granulocytes: 0 %
Lymphocytes Relative: 26 %
Lymphs Abs: 1.5 10*3/uL (ref 0.7–4.0)
MCH: 32.7 pg (ref 26.0–34.0)
MCHC: 34.4 g/dL (ref 30.0–36.0)
MCV: 95 fL (ref 80.0–100.0)
Monocytes Absolute: 0.7 10*3/uL (ref 0.1–1.0)
Monocytes Relative: 13 %
Neutro Abs: 3.1 10*3/uL (ref 1.7–7.7)
Neutrophils Relative %: 56 %
Platelets: 162 10*3/uL (ref 150–400)
RBC: 4.77 MIL/uL (ref 4.22–5.81)
RDW: 11.7 % (ref 11.5–15.5)
WBC: 5.6 10*3/uL (ref 4.0–10.5)
nRBC: 0 % (ref 0.0–0.2)

## 2022-02-15 LAB — IRON AND TIBC
Iron: 228 ug/dL — ABNORMAL HIGH (ref 45–182)
Saturation Ratios: 65 % — ABNORMAL HIGH (ref 17.9–39.5)
TIBC: 349 ug/dL (ref 250–450)
UIBC: 121 ug/dL

## 2022-02-15 LAB — FERRITIN: Ferritin: 397 ng/mL — ABNORMAL HIGH (ref 24–336)

## 2022-02-15 NOTE — Patient Instructions (Signed)

## 2022-04-05 DIAGNOSIS — J069 Acute upper respiratory infection, unspecified: Secondary | ICD-10-CM | POA: Diagnosis not present

## 2022-04-13 DIAGNOSIS — R059 Cough, unspecified: Secondary | ICD-10-CM | POA: Diagnosis not present

## 2022-04-13 DIAGNOSIS — I480 Paroxysmal atrial fibrillation: Secondary | ICD-10-CM | POA: Diagnosis not present

## 2022-04-13 DIAGNOSIS — J40 Bronchitis, not specified as acute or chronic: Secondary | ICD-10-CM | POA: Diagnosis not present

## 2022-04-13 DIAGNOSIS — R062 Wheezing: Secondary | ICD-10-CM | POA: Diagnosis not present

## 2022-04-13 DIAGNOSIS — I10 Essential (primary) hypertension: Secondary | ICD-10-CM | POA: Diagnosis not present

## 2022-04-13 DIAGNOSIS — E119 Type 2 diabetes mellitus without complications: Secondary | ICD-10-CM | POA: Diagnosis not present

## 2022-04-14 ENCOUNTER — Other Ambulatory Visit: Payer: Self-pay

## 2022-04-15 ENCOUNTER — Inpatient Hospital Stay: Payer: Medicare HMO | Attending: Oncology

## 2022-04-15 ENCOUNTER — Inpatient Hospital Stay: Payer: Medicare HMO

## 2022-04-15 LAB — CBC WITH DIFFERENTIAL (CANCER CENTER ONLY)
Abs Immature Granulocytes: 0.1 10*3/uL — ABNORMAL HIGH (ref 0.00–0.07)
Basophils Absolute: 0.1 10*3/uL (ref 0.0–0.1)
Basophils Relative: 1 %
Eosinophils Absolute: 0.1 10*3/uL (ref 0.0–0.5)
Eosinophils Relative: 1 %
HCT: 44.5 % (ref 39.0–52.0)
Hemoglobin: 15.2 g/dL (ref 13.0–17.0)
Immature Granulocytes: 1 %
Lymphocytes Relative: 32 %
Lymphs Abs: 2.9 10*3/uL (ref 0.7–4.0)
MCH: 32.6 pg (ref 26.0–34.0)
MCHC: 34.2 g/dL (ref 30.0–36.0)
MCV: 95.5 fL (ref 80.0–100.0)
Monocytes Absolute: 0.6 10*3/uL (ref 0.1–1.0)
Monocytes Relative: 7 %
Neutro Abs: 5.2 10*3/uL (ref 1.7–7.7)
Neutrophils Relative %: 58 %
Platelet Count: 188 10*3/uL (ref 150–400)
RBC: 4.66 MIL/uL (ref 4.22–5.81)
RDW: 11.9 % (ref 11.5–15.5)
WBC Count: 9 10*3/uL (ref 4.0–10.5)
nRBC: 0 % (ref 0.0–0.2)

## 2022-04-26 DIAGNOSIS — Z79899 Other long term (current) drug therapy: Secondary | ICD-10-CM | POA: Diagnosis not present

## 2022-04-26 DIAGNOSIS — I480 Paroxysmal atrial fibrillation: Secondary | ICD-10-CM | POA: Diagnosis not present

## 2022-04-26 DIAGNOSIS — R7303 Prediabetes: Secondary | ICD-10-CM | POA: Diagnosis not present

## 2022-04-26 DIAGNOSIS — E538 Deficiency of other specified B group vitamins: Secondary | ICD-10-CM | POA: Diagnosis not present

## 2022-04-26 DIAGNOSIS — E669 Obesity, unspecified: Secondary | ICD-10-CM | POA: Diagnosis not present

## 2022-04-26 DIAGNOSIS — Z6838 Body mass index (BMI) 38.0-38.9, adult: Secondary | ICD-10-CM | POA: Diagnosis not present

## 2022-04-26 DIAGNOSIS — Z13220 Encounter for screening for lipoid disorders: Secondary | ICD-10-CM | POA: Diagnosis not present

## 2022-04-26 DIAGNOSIS — Z0000 Encounter for general adult medical examination without abnormal findings: Secondary | ICD-10-CM | POA: Diagnosis not present

## 2022-04-26 DIAGNOSIS — I10 Essential (primary) hypertension: Secondary | ICD-10-CM | POA: Diagnosis not present

## 2022-06-11 ENCOUNTER — Other Ambulatory Visit: Payer: Self-pay | Admitting: *Deleted

## 2022-06-15 ENCOUNTER — Inpatient Hospital Stay: Payer: Medicare HMO

## 2022-06-15 ENCOUNTER — Encounter: Payer: Self-pay | Admitting: Oncology

## 2022-06-15 ENCOUNTER — Inpatient Hospital Stay (HOSPITAL_BASED_OUTPATIENT_CLINIC_OR_DEPARTMENT_OTHER): Payer: Medicare HMO | Admitting: Oncology

## 2022-06-15 ENCOUNTER — Inpatient Hospital Stay: Payer: Medicare HMO | Attending: Oncology

## 2022-06-15 DIAGNOSIS — Z7901 Long term (current) use of anticoagulants: Secondary | ICD-10-CM | POA: Insufficient documentation

## 2022-06-15 DIAGNOSIS — I4891 Unspecified atrial fibrillation: Secondary | ICD-10-CM | POA: Insufficient documentation

## 2022-06-15 DIAGNOSIS — I10 Essential (primary) hypertension: Secondary | ICD-10-CM | POA: Diagnosis not present

## 2022-06-15 LAB — CBC WITH DIFFERENTIAL/PLATELET
Abs Immature Granulocytes: 0.03 10*3/uL (ref 0.00–0.07)
Basophils Absolute: 0.1 10*3/uL (ref 0.0–0.1)
Basophils Relative: 1 %
Eosinophils Absolute: 0.2 10*3/uL (ref 0.0–0.5)
Eosinophils Relative: 4 %
HCT: 42.7 % (ref 39.0–52.0)
Hemoglobin: 14.8 g/dL (ref 13.0–17.0)
Immature Granulocytes: 1 %
Lymphocytes Relative: 31 %
Lymphs Abs: 1.8 10*3/uL (ref 0.7–4.0)
MCH: 33.5 pg (ref 26.0–34.0)
MCHC: 34.7 g/dL (ref 30.0–36.0)
MCV: 96.6 fL (ref 80.0–100.0)
Monocytes Absolute: 0.5 10*3/uL (ref 0.1–1.0)
Monocytes Relative: 8 %
Neutro Abs: 3.3 10*3/uL (ref 1.7–7.7)
Neutrophils Relative %: 55 %
Platelets: 178 10*3/uL (ref 150–400)
RBC: 4.42 MIL/uL (ref 4.22–5.81)
RDW: 11.5 % (ref 11.5–15.5)
WBC: 5.9 10*3/uL (ref 4.0–10.5)
nRBC: 0 % (ref 0.0–0.2)

## 2022-06-15 LAB — IRON AND TIBC
Iron: 188 ug/dL — ABNORMAL HIGH (ref 45–182)
Saturation Ratios: 51 % — ABNORMAL HIGH (ref 17.9–39.5)
TIBC: 372 ug/dL (ref 250–450)
UIBC: 184 ug/dL

## 2022-06-15 LAB — FERRITIN: Ferritin: 551 ng/mL — ABNORMAL HIGH (ref 24–336)

## 2022-06-15 NOTE — Progress Notes (Signed)
[facility]  [phone] [phone]  ID: Collin Holt OB: Dec 27, 1960  MR#: [id number]  AOZ#:[id number]  Patient Care Team: Marguarite Arbour, MD as PCP - General (Internal Medicine) Mariah Milling Tollie Pizza, MD as PCP - Cardiology (Cardiology) Antonieta Iba, MD as Consulting Physician (Cardiology) Jeralyn Ruths, MD as Consulting Physician (Oncology)  CHIEF COMPLAINT:  Hereditary hemochromatosis   INTERVAL HISTORY: Patient returns to clinic today for repeat laboratory work, further evaluation, and continuation of phlebotomy.  He continues to feel well and remains asymptomatic. He has no neurologic complaints.  He denies any weakness or fatigue.  He denies any recent fevers or illnesses. He has a good appetite and denies weight loss.  He has no chest pain, shortness of breath, cough, or hemoptysis.  He denies any nausea, vomiting, constipation, or diarrhea. He has no urinary complaints.  Patient offers no specific complaints today.  REVIEW OF SYSTEMS:   Review of Systems  Constitutional: Negative.  Negative for fever, malaise/fatigue and weight loss.  Respiratory: Negative.  Negative for cough and shortness of breath.   Cardiovascular: Negative.  Negative for chest pain and leg swelling.  Gastrointestinal: Negative.  Negative for abdominal pain, blood in stool and melena.  Genitourinary: Negative.  Negative for dysuria.  Musculoskeletal: Negative.  Negative for back pain and myalgias.  Skin: Negative.  Negative for rash.  Neurological: Negative.  Negative for dizziness, weakness and headaches.  Psychiatric/Behavioral: Negative.  The patient is not nervous/anxious.     As per HPI. Otherwise, a complete review of systems is negative.  PAST MEDICAL HISTORY: Past Medical History:  Diagnosis Date   Allergic rhinitis    Anxiety    B12 deficiency    Benign paroxysmal vertigo    Chronic headaches    Depression    Diabetes mellitus without  complication (HCC)    DOE (dyspnea on exertion)    a. 02/2010 Cath: nl cors;  b. 02/2010 CPX: good fxnl capacity.   Dysrhythmia    Hereditary hemochromatosis (HCC)    HTN (hypertension)    OSA (obstructive sleep apnea)    PAF (paroxysmal atrial fibrillation) (HCC)    a. 2012 Echo: nl LV fxn, mod dil LA.   Plantar fibromatosis    Pneumonia    Sleep apnea    Tinnitus     PAST SURGICAL HISTORY: Past Surgical History:  Procedure Laterality Date   CARDIOVERSION N/A 05/01/2021   Procedure: CARDIOVERSION;  Surgeon: Antonieta Iba, MD;  Location: [facility] ORS;  Service: Cardiovascular;  Laterality: N/A;   COLONOSCOPY WITH PROPOFOL N/A 07/23/2020   Procedure: COLONOSCOPY WITH PROPOFOL;  Surgeon: Toledo, Boykin Nearing, MD;  Location: [facility] ENDOSCOPY;  Service: Gastroenterology;  Laterality: N/A;   SHOULDER SURGERY     Right x 2    FAMILY HISTORY Family History  Problem Relation Age of Onset   Emphysema Father    Cancer Father    Prostate cancer Neg Hx    Colon cancer Neg Hx    Coronary artery disease Neg Hx    Diabetes Neg Hx        ADVANCED DIRECTIVES:    HEALTH MAINTENANCE: Social History   Tobacco Use   Smoking status: Former    Packs/day: 1.00    Years: 10.00    Additional pack years: 0.00    Total pack years: 10.00    Types: Cigarettes    Quit date: 01/19/1999    Years since quitting: 23.4   Smokeless tobacco: Former  Quit date: 01/19/2011  Vaping Use   Vaping Use: Never used  Substance Use Topics   Alcohol use: Yes    Alcohol/week: 4.0 standard drinks of alcohol    Types: 4 Glasses of wine per week    Comment: daily   Drug use: No     Allergies  Allergen Reactions   Morphine And Codeine Nausea And Vomiting   Terfenadine Other (See Comments)    Irregular heartbeat Other reaction(s): GI Upset (intolerance)    Current Outpatient Medications  Medication Sig Dispense Refill   azelastine (ASTELIN) 0.1 % nasal spray Place 1 spray into both nostrils 2 (two) times  daily.     cyanocobalamin (,VITAMIN B-12,) 1000 MCG/ML injection Inject 1,000 mcg into the muscle every 14 (fourteen) days.     flecainide (TAMBOCOR) 100 MG tablet Take 1 tablet (100 mg total) by mouth 2 (two) times daily as needed. 180 tablet 3   meclizine (ANTIVERT) 25 MG tablet Take 25 mg by mouth 3 (three) times daily as needed.     metFORMIN (GLUCOPHAGE) 500 MG tablet Take 500 mg by mouth 2 (two) times daily with a meal.     metoprolol succinate (TOPROL XL) 25 MG 24 hr tablet Take 1 tablet (25 mg) by mouth once daily. Take with or immediately following a meal. 90 tablet 3   rivaroxaban (XARELTO) 20 MG TABS tablet TAKE ONE TABLET BY MOUTH ONE TIME DAILY WITH SUPPER 90 tablet 1   SUMAtriptan (IMITREX) 100 MG tablet Take 100 mg by mouth every 2 (two) hours as needed for migraine.     tadalafil (CIALIS) 20 MG tablet Take 20 mg by mouth daily as needed for erectile dysfunction.     venlafaxine XR (EFFEXOR-XR) 75 MG 24 hr capsule Take 75 mg by mouth daily with breakfast.     ondansetron (ZOFRAN-ODT) 4 MG disintegrating tablet Take 4 mg by mouth every 8 (eight) hours as needed for nausea or vomiting. (Patient not taking: Reported on 12/15/2021)     No current facility-administered medications for this visit.    OBJECTIVE: Vitals:   06/15/22 1305  BP: 111/77  Pulse: (!) 54  Resp: 18  Temp: 98.1 F (36.7 C)  SpO2: 99%     Body mass index is 39.44 kg/m.    ECOG FS:0 - Asymptomatic  General: Well-developed, well-nourished, no acute distress. Eyes: Pink conjunctiva, anicteric sclera. HEENT: Normocephalic, moist mucous membranes. Lungs: No audible wheezing or coughing. Heart: Regular rate and rhythm. Abdomen: Soft, nontender, no obvious distention. Musculoskeletal: No edema, cyanosis, or clubbing. Neuro: Alert, answering all questions appropriately. Cranial nerves grossly intact. Skin: No rashes or petechiae noted. Psych: Normal affect.  LAB RESULTS:  Lab Results  Component Value  Date   NA 132 (L) 12/15/2021   K 3.9 12/15/2021   CL 102 12/15/2021   CO2 23 12/15/2021   GLUCOSE 263 (H) 12/15/2021   BUN 16 12/15/2021   CREATININE 0.86 12/15/2021   CALCIUM 8.7 (L) 12/15/2021   PROT 7.3 12/15/2021   ALBUMIN 3.9 12/15/2021   AST 52 (H) 12/15/2021   ALT 65 (H) 12/15/2021   ALKPHOS 43 12/15/2021   BILITOT 1.1 12/15/2021   GFRNONAA >60 12/15/2021   GFRAA 98 02/28/2019    Lab Results  Component Value Date   WBC 5.9 06/15/2022   NEUTROABS 3.3 06/15/2022   HGB 14.8 06/15/2022   HCT 42.7 06/15/2022   MCV 96.6 06/15/2022   PLT 178 06/15/2022   Lab Results  Component Value Date  FERRITIN 397 (H) 02/15/2022     STUDIES: No results found.  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  PLAN:    Hereditary hemochromatosis: Patient's hemoglobin and iron stores continue to be within normal limits.  His most recent ferritin continues to be elevated at 397, today's result is pending.  Goal ferritin is 50-100.  Proceed with 500 mL phlebotomy today.  Return to clinic in 2 and 4 months for laboratory work and phlebotomy only.  Patient will then return to clinic in 6 months with repeat laboratory, further evaluation, and continuation of phlebotomy if needed.  Atrial fibrillation: Continue Xarelto as ordered.  Follow-up with cardiology as indicated. Hypertension: Blood pressure within normal limits today.  Continue monitoring and treatment per primary care.   Patient expressed understanding that he can call or return to clinic at any time if he has any questions, concerns, or complaints.  Jeralyn Ruths, MD   06/15/2022 1:22 PM

## 2022-07-30 ENCOUNTER — Other Ambulatory Visit: Payer: Self-pay | Admitting: Cardiovascular Disease

## 2022-07-30 DIAGNOSIS — Z7901 Long term (current) use of anticoagulants: Secondary | ICD-10-CM

## 2022-07-30 DIAGNOSIS — I480 Paroxysmal atrial fibrillation: Secondary | ICD-10-CM

## 2022-07-30 NOTE — Telephone Encounter (Signed)
Prescription refill request for Xarelto received.  Indication:AFIB Last office visit:1/24 Weight:131.9  kg Age:62 Scr:1.0  4/24 CrCl:144.72  ml/min  Prescription refilled

## 2022-07-30 NOTE — Telephone Encounter (Signed)
Please review

## 2022-08-13 ENCOUNTER — Other Ambulatory Visit: Payer: Self-pay

## 2022-08-16 ENCOUNTER — Inpatient Hospital Stay: Payer: Medicare HMO | Attending: Oncology

## 2022-08-16 ENCOUNTER — Inpatient Hospital Stay: Payer: Medicare HMO

## 2022-08-16 LAB — CBC WITH DIFFERENTIAL/PLATELET
Abs Immature Granulocytes: 0.01 10*3/uL (ref 0.00–0.07)
Basophils Absolute: 0 10*3/uL (ref 0.0–0.1)
Basophils Relative: 1 %
Eosinophils Absolute: 0.2 10*3/uL (ref 0.0–0.5)
Eosinophils Relative: 4 %
HCT: 43.7 % (ref 39.0–52.0)
Hemoglobin: 15 g/dL (ref 13.0–17.0)
Immature Granulocytes: 0 %
Lymphocytes Relative: 29 %
Lymphs Abs: 1.4 10*3/uL (ref 0.7–4.0)
MCH: 33.4 pg (ref 26.0–34.0)
MCHC: 34.3 g/dL (ref 30.0–36.0)
MCV: 97.3 fL (ref 80.0–100.0)
Monocytes Absolute: 0.3 10*3/uL (ref 0.1–1.0)
Monocytes Relative: 7 %
Neutro Abs: 2.9 10*3/uL (ref 1.7–7.7)
Neutrophils Relative %: 60 %
Platelets: 174 10*3/uL (ref 150–400)
RBC: 4.49 MIL/uL (ref 4.22–5.81)
RDW: 11.6 % (ref 11.5–15.5)
WBC: 4.8 10*3/uL (ref 4.0–10.5)
nRBC: 0 /100 WBC

## 2022-08-16 LAB — IRON AND TIBC
Iron: 232 ug/dL — ABNORMAL HIGH (ref 45–182)
Saturation Ratios: 61 % — ABNORMAL HIGH (ref 17.9–39.5)
TIBC: 379 ug/dL (ref 250–450)
UIBC: 147 ug/dL

## 2022-08-16 LAB — FERRITIN: Ferritin: 529 ng/mL — ABNORMAL HIGH (ref 24–336)

## 2022-09-13 DIAGNOSIS — Z125 Encounter for screening for malignant neoplasm of prostate: Secondary | ICD-10-CM | POA: Diagnosis not present

## 2022-09-13 DIAGNOSIS — I10 Essential (primary) hypertension: Secondary | ICD-10-CM | POA: Diagnosis not present

## 2022-09-13 DIAGNOSIS — Z13220 Encounter for screening for lipoid disorders: Secondary | ICD-10-CM | POA: Diagnosis not present

## 2022-09-13 DIAGNOSIS — Z79899 Other long term (current) drug therapy: Secondary | ICD-10-CM | POA: Diagnosis not present

## 2022-09-13 DIAGNOSIS — Z0000 Encounter for general adult medical examination without abnormal findings: Secondary | ICD-10-CM | POA: Diagnosis not present

## 2022-09-13 DIAGNOSIS — R7303 Prediabetes: Secondary | ICD-10-CM | POA: Diagnosis not present

## 2022-09-13 DIAGNOSIS — I480 Paroxysmal atrial fibrillation: Secondary | ICD-10-CM | POA: Diagnosis not present

## 2022-09-13 DIAGNOSIS — E538 Deficiency of other specified B group vitamins: Secondary | ICD-10-CM | POA: Diagnosis not present

## 2022-09-13 DIAGNOSIS — G4733 Obstructive sleep apnea (adult) (pediatric): Secondary | ICD-10-CM | POA: Diagnosis not present

## 2022-10-18 ENCOUNTER — Inpatient Hospital Stay: Payer: Medicare HMO

## 2022-10-18 ENCOUNTER — Inpatient Hospital Stay: Payer: Medicare HMO | Attending: Oncology

## 2022-10-18 ENCOUNTER — Other Ambulatory Visit: Payer: Self-pay | Admitting: *Deleted

## 2022-11-16 ENCOUNTER — Other Ambulatory Visit: Payer: Self-pay | Admitting: Cardiovascular Disease

## 2022-11-16 DIAGNOSIS — I480 Paroxysmal atrial fibrillation: Secondary | ICD-10-CM

## 2022-11-20 ENCOUNTER — Other Ambulatory Visit: Payer: Self-pay | Admitting: Cardiovascular Disease

## 2022-11-20 DIAGNOSIS — I480 Paroxysmal atrial fibrillation: Secondary | ICD-10-CM

## 2022-11-24 ENCOUNTER — Telehealth: Payer: Self-pay | Admitting: Cardiovascular Disease

## 2022-11-24 MED ORDER — METOPROLOL SUCCINATE ER 25 MG PO TB24: ORAL_TABLET | ORAL | 3 refills | Status: DC

## 2022-11-24 NOTE — Telephone Encounter (Signed)
*  STAT* If patient is at the pharmacy, call can be transferred to refill team.   1. Which medications need to be refilled? (please list name of each medication and dose if known) metoprolol succinate (TOPROL XL) 25 MG 24 hr tablet   2. Which pharmacy/location (including street and city if local pharmacy) is medication to be sent to?  [id number] - [place], [place] - [address] [address]    3. Do they need a 30 day or 90 day supply? 90

## 2022-11-24 NOTE — Telephone Encounter (Signed)
*  STAT* If patient is at the pharmacy, call can be transferred to refill team.   1. Which medications need to be refilled? (please list name of each medication and dose if known) metoprolol 25mg    2. Would you like to learn more about the convenience, safety, & potential cost savings by using the [facility]? no     3. Are you open to using the [facility] (Type [facility]). no   4. Which pharmacy/location (including street and city if local pharmacy) is medication to be sent to? [facility] [place], [place] [address]    5. Do they need a 30 day or 90 day supply? 90

## 2022-12-20 ENCOUNTER — Inpatient Hospital Stay: Payer: Medicare HMO | Attending: Oncology

## 2022-12-20 DIAGNOSIS — Z7901 Long term (current) use of anticoagulants: Secondary | ICD-10-CM | POA: Insufficient documentation

## 2022-12-20 DIAGNOSIS — I480 Paroxysmal atrial fibrillation: Secondary | ICD-10-CM | POA: Insufficient documentation

## 2022-12-20 DIAGNOSIS — I10 Essential (primary) hypertension: Secondary | ICD-10-CM | POA: Insufficient documentation

## 2022-12-21 ENCOUNTER — Inpatient Hospital Stay: Payer: Medicare HMO | Admitting: Oncology

## 2022-12-21 ENCOUNTER — Inpatient Hospital Stay: Payer: Medicare HMO

## 2022-12-27 ENCOUNTER — Inpatient Hospital Stay: Payer: Medicare HMO

## 2022-12-27 ENCOUNTER — Other Ambulatory Visit: Payer: Medicare HMO

## 2022-12-27 DIAGNOSIS — I10 Essential (primary) hypertension: Secondary | ICD-10-CM | POA: Diagnosis not present

## 2022-12-27 DIAGNOSIS — I480 Paroxysmal atrial fibrillation: Secondary | ICD-10-CM | POA: Diagnosis not present

## 2022-12-27 DIAGNOSIS — Z7901 Long term (current) use of anticoagulants: Secondary | ICD-10-CM | POA: Diagnosis not present

## 2022-12-27 LAB — CBC WITH DIFFERENTIAL/PLATELET
Abs Immature Granulocytes: 0.03 10*3/uL (ref 0.00–0.07)
Basophils Absolute: 0.1 10*3/uL (ref 0.0–0.1)
Basophils Relative: 1 %
Eosinophils Absolute: 0.2 10*3/uL (ref 0.0–0.5)
Eosinophils Relative: 4 %
HCT: 43.4 % (ref 39.0–52.0)
Hemoglobin: 14.8 g/dL (ref 13.0–17.0)
Immature Granulocytes: 1 %
Lymphocytes Relative: 32 %
Lymphs Abs: 1.7 10*3/uL (ref 0.7–4.0)
MCH: 33.5 pg (ref 26.0–34.0)
MCHC: 34.1 g/dL (ref 30.0–36.0)
MCV: 98.2 fL (ref 80.0–100.0)
Monocytes Absolute: 0.6 10*3/uL (ref 0.1–1.0)
Monocytes Relative: 11 %
Neutro Abs: 2.7 10*3/uL (ref 1.7–7.7)
Neutrophils Relative %: 51 %
Platelets: 157 10*3/uL (ref 150–400)
RBC: 4.42 MIL/uL (ref 4.22–5.81)
RDW: 11.9 % (ref 11.5–15.5)
WBC: 5.2 10*3/uL (ref 4.0–10.5)
nRBC: 0 % (ref 0.0–0.2)

## 2022-12-27 LAB — IRON AND TIBC
Iron: 164 ug/dL (ref 45–182)
Saturation Ratios: 41 % — ABNORMAL HIGH (ref 17.9–39.5)
TIBC: 396 ug/dL (ref 250–450)
UIBC: 232 ug/dL

## 2022-12-27 LAB — FERRITIN: Ferritin: 402 ng/mL — ABNORMAL HIGH (ref 24–336)

## 2022-12-28 ENCOUNTER — Inpatient Hospital Stay: Payer: Medicare HMO

## 2022-12-28 ENCOUNTER — Inpatient Hospital Stay (HOSPITAL_BASED_OUTPATIENT_CLINIC_OR_DEPARTMENT_OTHER): Payer: Medicare HMO | Admitting: Oncology

## 2022-12-28 ENCOUNTER — Encounter: Payer: Self-pay | Admitting: Oncology

## 2022-12-28 DIAGNOSIS — I10 Essential (primary) hypertension: Secondary | ICD-10-CM | POA: Diagnosis not present

## 2022-12-28 DIAGNOSIS — I480 Paroxysmal atrial fibrillation: Secondary | ICD-10-CM | POA: Diagnosis not present

## 2022-12-28 DIAGNOSIS — Z7901 Long term (current) use of anticoagulants: Secondary | ICD-10-CM | POA: Diagnosis not present

## 2022-12-28 NOTE — Patient Instructions (Signed)
Therapeutic Phlebotomy Discharge Instructions  - Increase your fluid intake over the next 4 hours  - No smoking for 30 minutes  - Avoid using the affected arm (the one you had the blood drawn from) for heavy lifting or other activities.  - You may resume all normal activities after 30 minutes.  You are to notify the office if you experience:   - Persistent dizziness and/or lightheadedness -Uncontrolled or excessive bleeding at the site.   The following information offers guidance on how to care for yourself after your procedure. Your health care provider may also give you more specific instructions. If you have problems or questions, contact your health care provider. What can I expect after the procedure? After therapeutic phlebotomy, it is common to have: Light-headedness or dizziness. You may feel faint. Nausea. Tiredness (fatigue). Follow these instructions at home:  Eating and drinking Be sure to eat well-balanced meals for the next 24 hours. Drink enough fluid to keep your urine pale yellow. Avoid drinking alcohol on the day that you had the procedure. Activity  Return to your normal activities as told by your health care provider. Most people can go back to their normal activities right away. Avoid activities that take a lot of effort for about 5 hours after the procedure. Athletes should avoid strenuous exercise for at least 12 hours. Avoid heavy lifting or pulling for about 5 hours after the procedure. Do not lift anything that is heavier than 10 lb (4.5 kg). Change positions slowly for the remainder of the day, like from sitting to standing. This can help prevent light-headedness or fainting. If you feel light-headed, lie down until the feeling goes away. Needle insertion site care  Keep your bandage (dressing) dry. You can remove the bandage after about 5 hours or as told by your health care provider. If you have bleeding from the needle insertion site, raise  (elevate) your arm and press firmly on the site until the bleeding stops. If you have bruising at the site, apply ice to the area. To do this: Put ice in a plastic bag. Place a towel between your skin and the bag. Leave the ice on for 20 minutes, 2-3 times a day for the first 24 hours. Remove the ice if your skin turns bright red so you do not damage the area. If the swelling does not go away after 24 hours, apply a warm, moist cloth (warm compress) to the area for 20 minutes, 2-3 times a day. General instructions Do not use any products that contain nicotine or tobacco, like cigarettes, chewing tobacco, and vaping devices, such as e-cigarettes, for at least 30 minutes after the procedure. If you need help quitting, ask your health care provider. Keep all follow-up visits. You may need to continue having regular blood tests and therapeutic phlebotomy treatments as directed. Contact a health care provider if: You have redness, swelling, or pain at the needle insertion site. Fluid or blood is coming from the needle insertion site. Pus or a bad smell is coming from the needle insertion site. The needle insertion site feels warm to the touch. You feel light-headed, dizzy, or nauseous, and the feeling does not go away. You have new bruising at the needle insertion site. You feel weaker than normal. You have a fever or chills. Get help right away if: You have chest pain. You have trouble breathing. You have severe nausea or vomiting. Summary After the procedure, it is common to have some light-headedness, dizziness, nausea, or  tiredness (fatigue). Be sure to eat well-balanced meals for the next 24 hours. Drink enough fluid to keep your urine pale yellow. Return to your normal activities as told by your health care provider. Keep all follow-up visits. You may need to continue having regular blood tests and therapeutic phlebotomy treatments as directed. This information is not intended to replace  advice given to you by your health care provider. Make sure you discuss any questions you have with your health care provider. Document Revised: 07/02/2020 Document Reviewed: 07/02/2020 Elsevier Patient Education  2024 [employer].

## 2022-12-28 NOTE — Progress Notes (Signed)
Patient tolatered phlebotomy well today, performed in R hand using 20g angiocath. 500cc removed as ordered. Ferritin 402 yesterday. No concerns voiced. Snack and po fluids offered. Stable at discharge. AVS given.

## 2022-12-28 NOTE — Progress Notes (Signed)
[facility]  [phone] [phone]  ID: Collin Holt OB: 27-May-1960  MR#: [id number]  AOZ#:[id number]  Patient Care Team: Marguarite Arbour, MD as PCP - General (Internal Medicine) Mariah Milling Tollie Pizza, MD as PCP - Cardiology (Cardiology) Antonieta Iba, MD as Consulting Physician (Cardiology) Jeralyn Ruths, MD as Consulting Physician (Oncology)  CHIEF COMPLAINT:  Hereditary hemochromatosis   INTERVAL HISTORY: Patient returns to clinic today for repeat laboratory work, further evaluation, and continuation of phlebotomy.  He missed 1 treatment several months ago secondary to vacationing at [employer].  He continues to feel well and remains asymptomatic.  He has no neurologic complaints.  He denies any weakness or fatigue.  He denies any recent fevers or illnesses. He has a good appetite and denies weight loss.  He has no chest pain, shortness of breath, cough, or hemoptysis.  He denies any nausea, vomiting, constipation, or diarrhea. He has no urinary complaints.  Patient feels at his baseline and offers no specific complaints today.  REVIEW OF SYSTEMS:   Review of Systems  Constitutional: Negative.  Negative for fever, malaise/fatigue and weight loss.  Respiratory: Negative.  Negative for cough and shortness of breath.   Cardiovascular: Negative.  Negative for chest pain and leg swelling.  Gastrointestinal: Negative.  Negative for abdominal pain, blood in stool and melena.  Genitourinary: Negative.  Negative for dysuria.  Musculoskeletal: Negative.  Negative for back pain and myalgias.  Skin: Negative.  Negative for rash.  Neurological: Negative.  Negative for dizziness, weakness and headaches.  Psychiatric/Behavioral: Negative.  The patient is not nervous/anxious.     As per HPI. Otherwise, a complete review of systems is negative.  PAST MEDICAL HISTORY: Past Medical History:  Diagnosis Date   Allergic rhinitis    Anxiety    B12  deficiency    Benign paroxysmal vertigo    Chronic headaches    Depression    Diabetes mellitus without complication (HCC)    DOE (dyspnea on exertion)    a. 02/2010 Cath: nl cors;  b. 02/2010 CPX: good fxnl capacity.   Dysrhythmia    Hereditary hemochromatosis (HCC)    HTN (hypertension)    OSA (obstructive sleep apnea)    PAF (paroxysmal atrial fibrillation) (HCC)    a. 2012 Echo: nl LV fxn, mod dil LA.   Plantar fibromatosis    Pneumonia    Sleep apnea    Tinnitus     PAST SURGICAL HISTORY: Past Surgical History:  Procedure Laterality Date   CARDIOVERSION N/A 05/01/2021   Procedure: CARDIOVERSION;  Surgeon: Antonieta Iba, MD;  Location: [facility] ORS;  Service: Cardiovascular;  Laterality: N/A;   COLONOSCOPY WITH PROPOFOL N/A 07/23/2020   Procedure: COLONOSCOPY WITH PROPOFOL;  Surgeon: Toledo, Boykin Nearing, MD;  Location: [facility] ENDOSCOPY;  Service: Gastroenterology;  Laterality: N/A;   SHOULDER SURGERY     Right x 2    FAMILY HISTORY Family History  Problem Relation Age of Onset   Emphysema Father    Cancer Father    Prostate cancer Neg Hx    Colon cancer Neg Hx    Coronary artery disease Neg Hx    Diabetes Neg Hx        ADVANCED DIRECTIVES:    HEALTH MAINTENANCE: Social History   Tobacco Use   Smoking status: Former    Current packs/day: 0.00    Average packs/day: 1 pack/day for 10.0 years (10.0 ttl pk-yrs)    Types: Cigarettes    Start date:  01/18/1989    Quit date: 01/19/1999    Years since quitting: 23.9   Smokeless tobacco: Former    Quit date: 01/19/2011  Vaping Use   Vaping status: Never Used  Substance Use Topics   Alcohol use: Yes    Alcohol/week: 4.0 standard drinks of alcohol    Types: 4 Glasses of wine per week    Comment: daily   Drug use: No     Allergies  Allergen Reactions   Morphine And Codeine Nausea And Vomiting   Terfenadine Other (See Comments)    Irregular heartbeat Other reaction(s): GI Upset (intolerance)    Current Outpatient  Medications  Medication Sig Dispense Refill   azelastine (ASTELIN) 0.1 % nasal spray Place 1 spray into both nostrils 2 (two) times daily.     cyanocobalamin (,VITAMIN B-12,) 1000 MCG/ML injection Inject 1,000 mcg into the muscle every 14 (fourteen) days.     flecainide (TAMBOCOR) 100 MG tablet Take 1 tablet (100 mg total) by mouth 2 (two) times daily as needed. 180 tablet 3   losartan (COZAAR) 50 MG tablet Take 50 mg by mouth daily.     meclizine (ANTIVERT) 25 MG tablet Take 25 mg by mouth 3 (three) times daily as needed.     metFORMIN (GLUCOPHAGE) 500 MG tablet Take 500 mg by mouth 2 (two) times daily with a meal.     metoprolol succinate (TOPROL XL) 25 MG 24 hr tablet Take 1 tablet (25 mg) by mouth once daily. Take with or immediately following a meal. 90 tablet 3   rivaroxaban (XARELTO) 20 MG TABS tablet TAKE ONE TABLET BY MOUTH ONE TIME DAILY WITH SUPPER 90 tablet 1   SUMAtriptan (IMITREX) 100 MG tablet Take 100 mg by mouth every 2 (two) hours as needed for migraine.     tadalafil (CIALIS) 20 MG tablet Take 20 mg by mouth daily as needed for erectile dysfunction.     venlafaxine XR (EFFEXOR-XR) 75 MG 24 hr capsule Take 75 mg by mouth daily with breakfast.     ondansetron (ZOFRAN-ODT) 4 MG disintegrating tablet Take 4 mg by mouth every 8 (eight) hours as needed for nausea or vomiting. (Patient not taking: Reported on 12/15/2021)     No current facility-administered medications for this visit.    OBJECTIVE: Vitals:   12/28/22 1400  BP: (!) 142/75  Pulse: 63  Resp: 18  Temp: (!) 96.5 F (35.8 C)  SpO2: 98%     Body mass index is 39.2 kg/m.    ECOG FS:0 - Asymptomatic  General: Well-developed, well-nourished, no acute distress. Eyes: Pink conjunctiva, anicteric sclera. HEENT: Normocephalic, moist mucous membranes. Lungs: No audible wheezing or coughing. Heart: Regular rate and rhythm. Abdomen: Soft, nontender, no obvious distention. Musculoskeletal: No edema, cyanosis, or  clubbing. Neuro: Alert, answering all questions appropriately. Cranial nerves grossly intact. Skin: No rashes or petechiae noted. Psych: Normal affect.  LAB RESULTS:  Lab Results  Component Value Date   NA 132 (L) 12/15/2021   K 3.9 12/15/2021   CL 102 12/15/2021   CO2 23 12/15/2021   GLUCOSE 263 (H) 12/15/2021   BUN 16 12/15/2021   CREATININE 0.86 12/15/2021   CALCIUM 8.7 (L) 12/15/2021   PROT 7.3 12/15/2021   ALBUMIN 3.9 12/15/2021   AST 52 (H) 12/15/2021   ALT 65 (H) 12/15/2021   ALKPHOS 43 12/15/2021   BILITOT 1.1 12/15/2021   GFRNONAA >60 12/15/2021   GFRAA 98 02/28/2019    Lab Results  Component Value Date  WBC 5.2 12/27/2022   NEUTROABS 2.7 12/27/2022   HGB 14.8 12/27/2022   HCT 43.4 12/27/2022   MCV 98.2 12/27/2022   PLT 157 12/27/2022   Lab Results  Component Value Date   FERRITIN 402 (H) 12/27/2022     STUDIES: No results found.  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  PLAN:    Hereditary hemochromatosis: Patient's hemoglobin and iron stores continue to be within normal limits.  His ferritin remains chronically elevated, but relatively stable at 402.  Goal ferritin is 50-100.  Proceed with 500 mL phlebotomy today.  Return to clinic in 2 and 4 months for laboratory work and phlebotomy only.  Patient would then return to clinic in 6 months with repeat laboratory work, further evaluation, and continuation of treatment if needed.  Atrial fibrillation: Continue Xarelto as ordered.  Follow-up with cardiology as indicated. Hypertension: Blood pressure mildly elevated today.  Continue monitoring and treatment per primary care.  I spent a total of 30 minutes reviewing chart data, face-to-face evaluation with the patient, counseling and coordination of care as detailed above.    Patient expressed understanding that he can call or return to clinic at any time if he has any questions, concerns, or  complaints.  Jeralyn Ruths, MD   12/28/2022 2:26 PM

## 2023-01-31 DIAGNOSIS — E669 Obesity, unspecified: Secondary | ICD-10-CM | POA: Diagnosis not present

## 2023-01-31 DIAGNOSIS — I10 Essential (primary) hypertension: Secondary | ICD-10-CM | POA: Diagnosis not present

## 2023-01-31 DIAGNOSIS — Z79899 Other long term (current) drug therapy: Secondary | ICD-10-CM | POA: Diagnosis not present

## 2023-01-31 DIAGNOSIS — Z6838 Body mass index (BMI) 38.0-38.9, adult: Secondary | ICD-10-CM | POA: Diagnosis not present

## 2023-01-31 DIAGNOSIS — Z0000 Encounter for general adult medical examination without abnormal findings: Secondary | ICD-10-CM | POA: Diagnosis not present

## 2023-01-31 DIAGNOSIS — I480 Paroxysmal atrial fibrillation: Secondary | ICD-10-CM | POA: Diagnosis not present

## 2023-01-31 DIAGNOSIS — R7303 Prediabetes: Secondary | ICD-10-CM | POA: Diagnosis not present

## 2023-02-28 ENCOUNTER — Inpatient Hospital Stay: Payer: Medicare HMO

## 2023-02-28 ENCOUNTER — Inpatient Hospital Stay: Payer: Medicare HMO | Attending: Oncology

## 2023-04-28 ENCOUNTER — Inpatient Hospital Stay: Payer: Medicare HMO

## 2023-04-28 ENCOUNTER — Other Ambulatory Visit: Payer: Self-pay

## 2023-04-28 ENCOUNTER — Inpatient Hospital Stay: Payer: Medicare HMO | Attending: Oncology

## 2023-04-28 LAB — CBC WITH DIFFERENTIAL/PLATELET
Abs Immature Granulocytes: 0.02 10*3/uL (ref 0.00–0.07)
Basophils Absolute: 0 10*3/uL (ref 0.0–0.1)
Basophils Relative: 1 %
Eosinophils Absolute: 0.2 10*3/uL (ref 0.0–0.5)
Eosinophils Relative: 4 %
HCT: 43.4 % (ref 39.0–52.0)
Hemoglobin: 15 g/dL (ref 13.0–17.0)
Immature Granulocytes: 0 %
Lymphocytes Relative: 31 %
Lymphs Abs: 1.6 10*3/uL (ref 0.7–4.0)
MCH: 33.3 pg (ref 26.0–34.0)
MCHC: 34.6 g/dL (ref 30.0–36.0)
MCV: 96.4 fL (ref 80.0–100.0)
Monocytes Absolute: 0.5 10*3/uL (ref 0.1–1.0)
Monocytes Relative: 10 %
Neutro Abs: 2.9 10*3/uL (ref 1.7–7.7)
Neutrophils Relative %: 54 %
Platelets: 158 10*3/uL (ref 150–400)
RBC: 4.5 MIL/uL (ref 4.22–5.81)
RDW: 11.7 % (ref 11.5–15.5)
WBC: 5.3 10*3/uL (ref 4.0–10.5)
nRBC: 0 % (ref 0.0–0.2)

## 2023-04-28 LAB — IRON AND TIBC
Iron: 172 ug/dL (ref 45–182)
Saturation Ratios: 47 % — ABNORMAL HIGH (ref 17.9–39.5)
TIBC: 367 ug/dL (ref 250–450)
UIBC: 195 ug/dL

## 2023-04-28 LAB — FERRITIN: Ferritin: 537 ng/mL — ABNORMAL HIGH (ref 24–336)

## 2023-04-28 NOTE — Patient Instructions (Signed)

## 2023-04-28 NOTE — Progress Notes (Signed)
 Collin Holt presents today for phlebotomy per MD orders. Phlebotomy procedure started at 1325  and ended at 1340. 500 mls removed. Patient tolerated procedure well. IV needle removed intact.

## 2023-05-13 ENCOUNTER — Ambulatory Visit: Attending: Student | Admitting: Student

## 2023-05-13 ENCOUNTER — Encounter: Payer: Self-pay | Admitting: Student

## 2023-05-13 VITALS — BP 130/78 | HR 59 | Ht 72.0 in | Wt 290.8 lb

## 2023-05-13 DIAGNOSIS — F1090 Alcohol use, unspecified, uncomplicated: Secondary | ICD-10-CM | POA: Diagnosis not present

## 2023-05-13 DIAGNOSIS — Z789 Other specified health status: Secondary | ICD-10-CM

## 2023-05-13 DIAGNOSIS — I10 Essential (primary) hypertension: Secondary | ICD-10-CM | POA: Diagnosis not present

## 2023-05-13 DIAGNOSIS — G4733 Obstructive sleep apnea (adult) (pediatric): Secondary | ICD-10-CM

## 2023-05-13 DIAGNOSIS — I480 Paroxysmal atrial fibrillation: Secondary | ICD-10-CM | POA: Diagnosis not present

## 2023-05-13 NOTE — Patient Instructions (Signed)
 Medication Instructions:  Your Physician recommend you continue on your current medication as directed.    *If you need a refill on your cardiac medications before your next appointment, please call your pharmacy*  Lab Work: No labs ordered today  If you have labs (blood work) drawn today and your tests are completely normal, you will receive your results only by: MyChart Message (if you have MyChart) OR A paper copy in the mail If you have any lab test that is abnormal or we need to change your treatment, we will call you to review the results.  Testing/Procedures: No test ordered today   Follow-Up: At [facility], you and your health needs are our priority.  As part of our continuing mission to provide you with exceptional heart care, our providers are all part of one team.  This team includes your primary Cardiologist (physician) and Advanced Practice Providers or APPs (Physician Assistants and Nurse Practitioners) who all work together to provide you with the care you need, when you need it.  Your next appointment:   1 year(s)  Provider:   Belva Boyden, MD or Morey Ar, NP

## 2023-05-13 NOTE — Progress Notes (Signed)
 Cardiology Clinic Note   Date: 05/13/2023 ID: Collin Holt, DOB 01-07-1961, MRN [id number]  Primary Cardiologist:  Belva Boyden, MD  Chief Complaint   Collin Holt is a 63 y.o. male who presents to the clinic today for routine follow up.   Patient Profile   Collin BRADSHER is followed by Dr. Gollan for the history outlined below.      Past medical history significant for: PAF. Echo 04/22/2021: EF 55 to 60%.  No RWMA.  Mild LVH.  Indeterminate diastolic parameters.  Normal RV size/function.  Trivial MR.  Mild AI.  DCCV 05/01/2021. Hypertension. OSA. CPAP 100% adherence.   In summary, patient was previously followed by Dr. Lucie Ruts for A-fib.  He had an isolated A-fib event at age 85 with no provoking factors.  He established care with Dr. Gollan in August 2011.  Echo in January 2012 demonstrated EF 55 to 65%, no RWMA, normal diastolic parameters, trivial AI, moderate LAE.  Stress test showed excellent functional capacity with no cardiac limitation.  Prior cardiac catheterization in 2012 showed no significant CAD.  Patient was seen in April 2023 with reports of A-fib with rapid rate.  EKG demonstrated A-fib with rate of 140.  He continued daily use of alcohol.  He underwent cardioversion on 05/01/2021.  Upon follow-up he was bradycardic metoprolol  dose was decreased.  In May 2023 he was found to be back in A-fib with RVR.  He was started on flecainide  and metoprolol  was increased.  Upon follow-up in 2023 he was maintaining sinus rhythm.  Patient was last seen in the office by Dr. Gollan on 02/09/2022 for routine follow-up.  He was maintaining sinus rhythm at that time.  Alcohol cessation was recommended.  He was instructed to increase Toprol  to 1 tablet.  BP was elevated at the time of his visit.  He declined any other medications for BP control.     History of Present Illness    Today, patient reports he is doing well. Patient denies shortness of breath, dyspnea on exertion, lower  extremity edema, orthopnea or PND. No chest pain, pressure, or tightness. No palpitations. He does not typically feel palpitations when he is in afib but rather feels a lightheaded. He continues to have brief episodes of afib but none that are more than a couple of minutes. He drinks 4-5 cups of coffee in the mornings and 2-3 glasses of wine in the evenings. If he consumes more alcohol than his norm he will usually have more episodes of afib. He is retired. He stays active doing yard work and other household chores.     ROS: All other systems reviewed and are otherwise negative except as noted in History of Present Illness.  EKGs/Labs Reviewed    EKG Interpretation Date/Time:  Friday May 13 2023 10:33:44 EDT Ventricular Rate:  59 PR Interval:  148 QRS Duration:  100 QT Interval:  432 QTC Calculation: 427 R Axis:   -26  Text Interpretation: Sinus bradycardia Nonspecific T wave abnormality When compared with ECG of 02/26/2014 No significant change Confirmed by Morey Ar [phone]) on 05/13/2023 10:45:39 AM    04/28/2023: Hemoglobin 15.0; WBC 5.3    Risk Assessment/Calculations     CHA2DS2-VASc Score = 1   This indicates a 0.6% annual risk of stroke. The patient's score is based upon: CHF History: 0 HTN History: 1 Diabetes History: 0 Stroke History: 0 Vascular Disease History: 0 Age Score: 0 Gender Score: 0  Physical Exam    VS:  BP 130/78   Pulse (!) 59   Ht 6' (1.829 m)   Wt 290 lb 12.8 oz (131.9 kg)   SpO2 98%   BMI 39.44 kg/m  , BMI Body mass index is 39.44 kg/m.  GEN: Well nourished, well developed, in no acute distress. Neck: No JVD or carotid bruits. Cardiac:  RRR. No murmurs. No rubs or gallops.   Respiratory:  Respirations regular and unlabored. Clear to auscultation without rales, wheezing or rhonchi. GI: Soft, nontender, nondistended. Extremities: Radials/DP/PT 2+ and equal bilaterally. No clubbing or cyanosis. No edema.  Skin: Warm  and dry, no rash. Neuro: Strength intact.  Assessment & Plan   PAF Echo April 2023 showed normal LV/RV function, mild LVH, trivial MR, mild AI.  Denies spontaneous bleeding concerns.  Patient reports brief episodes of afib marked by lightheadedness. No palpitations. He continues to drink 4-5 cups of coffee in the morning and 2-3 glasses of wine at night. EKG today shows sinus bradycardia 59 bpm. He reports he occasionally cuts Xarelto  in half. Discussed the importance of taking full dose of Xarelto  to protect from stroke. He voiced understanding.  - Continue flecainide , Toprol , Xarelto .  Hypertension BP today 130/78. No report of headaches. He has brief lightheadedness with episodes of afib.  - Continue losartan, Toprol .  OSA Patient reports 100% adherence with CPAP.  - Continue use of CPAP.  Caffeine/Alcohol use Patient reports drinking 4-5 cups of coffee each morning and 2-3 glasses of wine a night. Discussed the impact this has on afib. He voiced understanding.  -Encouraged decreasing caffeine intake and cutting back on alcohol use.   Disposition: Return in 1 year or sooner as needed.          Signed, Lonell Rives. Maciah Schweigert, DNP, NP-C

## 2023-06-27 ENCOUNTER — Inpatient Hospital Stay: Payer: Medicare HMO | Attending: Oncology

## 2023-06-27 DIAGNOSIS — Z7901 Long term (current) use of anticoagulants: Secondary | ICD-10-CM | POA: Insufficient documentation

## 2023-06-27 DIAGNOSIS — I10 Essential (primary) hypertension: Secondary | ICD-10-CM | POA: Insufficient documentation

## 2023-06-27 DIAGNOSIS — I480 Paroxysmal atrial fibrillation: Secondary | ICD-10-CM | POA: Insufficient documentation

## 2023-06-28 ENCOUNTER — Inpatient Hospital Stay: Payer: Medicare HMO

## 2023-06-28 ENCOUNTER — Inpatient Hospital Stay: Payer: Medicare HMO | Admitting: Oncology

## 2023-07-04 ENCOUNTER — Inpatient Hospital Stay

## 2023-07-04 DIAGNOSIS — I480 Paroxysmal atrial fibrillation: Secondary | ICD-10-CM | POA: Diagnosis not present

## 2023-07-04 DIAGNOSIS — Z7901 Long term (current) use of anticoagulants: Secondary | ICD-10-CM | POA: Diagnosis not present

## 2023-07-04 DIAGNOSIS — I10 Essential (primary) hypertension: Secondary | ICD-10-CM | POA: Diagnosis not present

## 2023-07-04 LAB — IRON AND TIBC
Iron: 186 ug/dL — ABNORMAL HIGH (ref 45–182)
Saturation Ratios: 52 % — ABNORMAL HIGH (ref 17.9–39.5)
TIBC: 358 ug/dL (ref 250–450)
UIBC: 172 ug/dL

## 2023-07-04 LAB — CBC WITH DIFFERENTIAL/PLATELET
Abs Immature Granulocytes: 0.02 10*3/uL (ref 0.00–0.07)
Basophils Absolute: 0.1 10*3/uL (ref 0.0–0.1)
Basophils Relative: 1 %
Eosinophils Absolute: 0.2 10*3/uL (ref 0.0–0.5)
Eosinophils Relative: 4 %
HCT: 41 % (ref 39.0–52.0)
Hemoglobin: 14.2 g/dL (ref 13.0–17.0)
Immature Granulocytes: 0 %
Lymphocytes Relative: 33 %
Lymphs Abs: 1.7 10*3/uL (ref 0.7–4.0)
MCH: 33.7 pg (ref 26.0–34.0)
MCHC: 34.6 g/dL (ref 30.0–36.0)
MCV: 97.4 fL (ref 80.0–100.0)
Monocytes Absolute: 0.6 10*3/uL (ref 0.1–1.0)
Monocytes Relative: 11 %
Neutro Abs: 2.7 10*3/uL (ref 1.7–7.7)
Neutrophils Relative %: 51 %
Platelets: 147 10*3/uL — ABNORMAL LOW (ref 150–400)
RBC: 4.21 MIL/uL — ABNORMAL LOW (ref 4.22–5.81)
RDW: 11.4 % — ABNORMAL LOW (ref 11.5–15.5)
WBC: 5.2 10*3/uL (ref 4.0–10.5)
nRBC: 0 % (ref 0.0–0.2)

## 2023-07-04 LAB — FERRITIN: Ferritin: 381 ng/mL — ABNORMAL HIGH (ref 24–336)

## 2023-07-07 ENCOUNTER — Inpatient Hospital Stay

## 2023-07-07 ENCOUNTER — Encounter: Payer: Self-pay | Admitting: Oncology

## 2023-07-07 ENCOUNTER — Inpatient Hospital Stay (HOSPITAL_BASED_OUTPATIENT_CLINIC_OR_DEPARTMENT_OTHER): Admitting: Oncology

## 2023-07-07 DIAGNOSIS — I10 Essential (primary) hypertension: Secondary | ICD-10-CM | POA: Diagnosis not present

## 2023-07-07 DIAGNOSIS — Z7901 Long term (current) use of anticoagulants: Secondary | ICD-10-CM | POA: Diagnosis not present

## 2023-07-07 DIAGNOSIS — I480 Paroxysmal atrial fibrillation: Secondary | ICD-10-CM | POA: Diagnosis not present

## 2023-07-07 NOTE — Progress Notes (Signed)
 [facility]  Telephone:(336) [phone] Fax:(336) [phone]  ID: Collin Holt OB: Oct 08, 1960  MR#: [id number]  WUX#:[id number]  Patient Care Team: Yehuda Helms, MD as PCP - General (Internal Medicine) Jerelene Monday Deadra Everts, MD as PCP - Cardiology (Cardiology) Devorah Fonder, MD as Consulting Physician (Cardiology) Shellie Dials, MD as Consulting Physician (Oncology)  CHIEF COMPLAINT:  Hereditary hemochromatosis   INTERVAL HISTORY: Patient returns to clinic today for repeat laboratory work, further evaluation, and continuation of phlebotomy.  He missed 1 treatment several months ago secondary to vacationing at [employer].  He continues to feel well and remains asymptomatic.  He has no neurologic complaints.  He denies any weakness or fatigue.  He denies any recent fevers or illnesses. He has a good appetite and denies weight loss.  He has no chest pain, shortness of breath, cough, or hemoptysis.  He denies any nausea, vomiting, constipation, or diarrhea. He has no urinary complaints.  Patient feels at his baseline and offers no specific complaints today.  REVIEW OF SYSTEMS:   Review of Systems  Constitutional: Negative.  Negative for fever, malaise/fatigue and weight loss.  Respiratory: Negative.  Negative for cough and shortness of breath.   Cardiovascular: Negative.  Negative for chest pain and leg swelling.  Gastrointestinal: Negative.  Negative for abdominal pain, blood in stool and melena.  Genitourinary: Negative.  Negative for dysuria.  Musculoskeletal: Negative.  Negative for back pain and myalgias.  Skin: Negative.  Negative for rash.  Neurological: Negative.  Negative for dizziness, weakness and headaches.  Psychiatric/Behavioral: Negative.  The patient is not nervous/anxious.     As per HPI. Otherwise, a complete review of systems is negative.  PAST MEDICAL HISTORY: Past Medical History:  Diagnosis Date  . Allergic rhinitis   . Anxiety   . B12  deficiency   . Benign paroxysmal vertigo   . Chronic headaches   . Depression   . Diabetes mellitus without complication (HCC)   . DOE (dyspnea on exertion)    a. 02/2010 Cath: nl cors;  b. 02/2010 CPX: good fxnl capacity.  [name] Dysrhythmia   . Hereditary hemochromatosis (HCC)   . HTN (hypertension)   . OSA (obstructive sleep apnea)   . PAF (paroxysmal atrial fibrillation) (HCC)    a. 2012 Echo: nl LV fxn, mod dil LA.  [name] Plantar fibromatosis   . Pneumonia   . Sleep apnea   . Tinnitus     PAST SURGICAL HISTORY: Past Surgical History:  Procedure Laterality Date  . CARDIOVERSION N/A 05/01/2021   Procedure: CARDIOVERSION;  Surgeon: Devorah Fonder, MD;  Location: [facility] ORS;  Service: Cardiovascular;  Laterality: N/A;  . COLONOSCOPY WITH PROPOFOL  N/A 07/23/2020   Procedure: COLONOSCOPY WITH PROPOFOL ;  Surgeon: Toledo, Alphonsus Jeans, MD;  Location: [facility] ENDOSCOPY;  Service: Gastroenterology;  Laterality: N/A;  . SHOULDER SURGERY     Right x 2    FAMILY HISTORY Family History  Problem Relation Age of Onset  . Emphysema Father   . Cancer Father   . Prostate cancer Neg Hx   . Colon cancer Neg Hx   . Coronary artery disease Neg Hx   . Diabetes Neg Hx        ADVANCED DIRECTIVES:    HEALTH MAINTENANCE: Social History   Tobacco Use  . Smoking status: Former    Current packs/day: 0.00    Average packs/day: 1 pack/day for 10.0 years (10.0 ttl pk-yrs)    Types: Cigarettes    Start date:  01/18/1989    Quit date: 01/19/1999    Years since quitting: 24.4  . Smokeless tobacco: Former    Quit date: 01/19/2011  Vaping Use  . Vaping status: Never Used  Substance Use Topics  . Alcohol use: Yes    Alcohol/week: 4.0 standard drinks of alcohol    Types: 4 Glasses of wine per week    Comment: daily  . Drug use: No     Allergies  Allergen Reactions  . Morphine And Codeine Nausea And Vomiting  . Terfenadine Other (See Comments)    Irregular heartbeat Other reaction(s): GI Upset  (intolerance)    Current Outpatient Medications  Medication Sig Dispense Refill  . cyanocobalamin  (,VITAMIN B-12,) 1000 MCG/ML injection Inject 1,000 mcg into the muscle every 14 (fourteen) days.    [name] losartan (COZAAR) 50 MG tablet Take 50 mg by mouth daily.    . meclizine (ANTIVERT) 25 MG tablet Take 25 mg by mouth 3 (three) times daily as needed.    . metFORMIN (GLUCOPHAGE) 500 MG tablet Take 500 mg by mouth 2 (two) times daily with a meal.    . metoprolol  succinate (TOPROL  XL) 25 MG 24 hr tablet Take 1 tablet (25 mg) by mouth once daily. Take with or immediately following a meal. 90 tablet 3  . ondansetron (ZOFRAN-ODT) 4 MG disintegrating tablet Take 4 mg by mouth every 8 (eight) hours as needed for nausea or vomiting.    . rivaroxaban  (XARELTO ) 20 MG TABS tablet TAKE ONE TABLET BY MOUTH ONE TIME DAILY WITH SUPPER 90 tablet 1  . SUMAtriptan (IMITREX) 100 MG tablet Take 100 mg by mouth every 2 (two) hours as needed for migraine.    . tadalafil (CIALIS) 20 MG tablet Take 20 mg by mouth daily as needed for erectile dysfunction.    [name] venlafaxine XR (EFFEXOR-XR) 75 MG 24 hr capsule Take 75 mg by mouth daily with breakfast.    . flecainide  (TAMBOCOR ) 100 MG tablet Take 1 tablet (100 mg total) by mouth 2 (two) times daily as needed. (Patient not taking: Reported on 07/07/2023) 180 tablet 3   No current facility-administered medications for this visit.    OBJECTIVE: Vitals:   07/07/23 1410  BP: 120/73  Pulse: 60  Resp: 18  Temp: 98.7 F (37.1 C)  SpO2: 99%     Body mass index is 38.94 kg/m.    ECOG FS:0 - Asymptomatic  General: Well-developed, well-nourished, no acute distress. Eyes: Pink conjunctiva, anicteric sclera. HEENT: Normocephalic, moist mucous membranes. Lungs: No audible wheezing or coughing. Heart: Regular rate and rhythm. Abdomen: Soft, nontender, no obvious distention. Musculoskeletal: No edema, cyanosis, or clubbing. Neuro: Alert, answering all questions appropriately.  Cranial nerves grossly intact. Skin: No rashes or petechiae noted. Psych: Normal affect.  LAB RESULTS:  Lab Results  Component Value Date   NA 132 (L) 12/15/2021   K 3.9 12/15/2021   CL 102 12/15/2021   CO2 23 12/15/2021   GLUCOSE 263 (H) 12/15/2021   BUN 16 12/15/2021   CREATININE 0.86 12/15/2021   CALCIUM 8.7 (L) 12/15/2021   PROT 7.3 12/15/2021   ALBUMIN 3.9 12/15/2021   AST 52 (H) 12/15/2021   ALT 65 (H) 12/15/2021   ALKPHOS 43 12/15/2021   BILITOT 1.1 12/15/2021   GFRNONAA >60 12/15/2021   GFRAA 98 02/28/2019    Lab Results  Component Value Date   WBC 5.2 07/04/2023   NEUTROABS 2.7 07/04/2023   HGB 14.2 07/04/2023   HCT 41.0 07/04/2023   MCV  97.4 07/04/2023   PLT 147 (L) 07/04/2023   Lab Results  Component Value Date   FERRITIN 381 (H) 07/04/2023     STUDIES: No results found.  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  PLAN:    Hereditary hemochromatosis: Patient's hemoglobin and iron stores continue to be within normal limits.  His ferritin remains chronically elevated, but relatively stable at 402.  Goal ferritin is 50-100.  Proceed with 500 mL phlebotomy today.  Return to clinic in 2 and 4 months for laboratory work and phlebotomy only.  Patient would then return to clinic in 6 months with repeat laboratory work, further evaluation, and continuation of treatment if needed.  Atrial fibrillation: Continue Xarelto  as ordered.  Follow-up with cardiology as indicated. Hypertension: Blood pressure mildly elevated today.  Continue monitoring and treatment per primary care.  I spent a total of 30 minutes reviewing chart data, face-to-face evaluation with the patient, counseling and coordination of care as detailed above.    Patient expressed understanding that he can call or return to clinic at any time if he has any questions, concerns, or complaints.  Shellie Dials, MD   07/07/2023 2:27 PM

## 2023-07-07 NOTE — Progress Notes (Signed)
 Collin Holt presents today for phlebotomy per MD orders. Phlebotomy procedure started at 1440 and ended at 1253. 500 mls removed. Patient tolerated procedure well. IV needle removed intact.

## 2023-07-07 NOTE — Patient Instructions (Signed)

## 2023-07-08 ENCOUNTER — Encounter: Payer: Self-pay | Admitting: Oncology

## 2023-08-04 ENCOUNTER — Inpatient Hospital Stay

## 2023-08-04 ENCOUNTER — Inpatient Hospital Stay: Attending: Oncology

## 2023-08-08 ENCOUNTER — Other Ambulatory Visit: Payer: Self-pay | Admitting: Cardiovascular Disease

## 2023-08-08 DIAGNOSIS — Z7901 Long term (current) use of anticoagulants: Secondary | ICD-10-CM

## 2023-08-08 DIAGNOSIS — I480 Paroxysmal atrial fibrillation: Secondary | ICD-10-CM

## 2023-08-10 NOTE — Telephone Encounter (Signed)
 Prescription refill request for Xarelto  received.  Indication: a fib Last office visit: 05/13/23 Weight: 287 Age: 63 Scr: 1.1 01/31/23 care everywhere CrCl: 128 ml/min

## 2023-08-26 DIAGNOSIS — M546 Pain in thoracic spine: Secondary | ICD-10-CM | POA: Diagnosis not present

## 2023-09-08 ENCOUNTER — Inpatient Hospital Stay

## 2023-09-08 ENCOUNTER — Inpatient Hospital Stay: Attending: Oncology

## 2023-09-08 DIAGNOSIS — I10 Essential (primary) hypertension: Secondary | ICD-10-CM | POA: Diagnosis not present

## 2023-09-08 DIAGNOSIS — F419 Anxiety disorder, unspecified: Secondary | ICD-10-CM | POA: Diagnosis not present

## 2023-09-08 DIAGNOSIS — Z79899 Other long term (current) drug therapy: Secondary | ICD-10-CM | POA: Diagnosis not present

## 2023-09-08 DIAGNOSIS — Z125 Encounter for screening for malignant neoplasm of prostate: Secondary | ICD-10-CM | POA: Diagnosis not present

## 2023-09-08 DIAGNOSIS — E66812 Obesity, class 2: Secondary | ICD-10-CM | POA: Diagnosis not present

## 2023-09-08 DIAGNOSIS — Z13220 Encounter for screening for lipoid disorders: Secondary | ICD-10-CM | POA: Diagnosis not present

## 2023-09-08 DIAGNOSIS — R7303 Prediabetes: Secondary | ICD-10-CM | POA: Diagnosis not present

## 2023-09-08 DIAGNOSIS — I480 Paroxysmal atrial fibrillation: Secondary | ICD-10-CM | POA: Diagnosis not present

## 2023-09-08 DIAGNOSIS — E538 Deficiency of other specified B group vitamins: Secondary | ICD-10-CM | POA: Diagnosis not present

## 2023-09-08 LAB — CBC WITH DIFFERENTIAL/PLATELET
Abs Immature Granulocytes: 0.04 K/uL (ref 0.00–0.07)
Basophils Absolute: 0.1 K/uL (ref 0.0–0.1)
Basophils Relative: 1 %
Eosinophils Absolute: 0.2 K/uL (ref 0.0–0.5)
Eosinophils Relative: 3 %
HCT: 41.5 % (ref 39.0–52.0)
Hemoglobin: 14.4 g/dL (ref 13.0–17.0)
Immature Granulocytes: 1 %
Lymphocytes Relative: 26 %
Lymphs Abs: 1.7 K/uL (ref 0.7–4.0)
MCH: 33.6 pg (ref 26.0–34.0)
MCHC: 34.7 g/dL (ref 30.0–36.0)
MCV: 97 fL (ref 80.0–100.0)
Monocytes Absolute: 0.7 K/uL (ref 0.1–1.0)
Monocytes Relative: 11 %
Neutro Abs: 3.9 K/uL (ref 1.7–7.7)
Neutrophils Relative %: 58 %
Platelets: 159 K/uL (ref 150–400)
RBC: 4.28 MIL/uL (ref 4.22–5.81)
RDW: 11.9 % (ref 11.5–15.5)
WBC: 6.6 K/uL (ref 4.0–10.5)
nRBC: 0 % (ref 0.0–0.2)

## 2023-09-08 LAB — IRON AND TIBC
Iron: 140 ug/dL (ref 45–182)
Saturation Ratios: 39 % (ref 17.9–39.5)
TIBC: 361 ug/dL (ref 250–450)
UIBC: 221 ug/dL

## 2023-09-08 LAB — FERRITIN: Ferritin: 584 ng/mL — ABNORMAL HIGH (ref 24–336)

## 2023-09-08 NOTE — Progress Notes (Signed)
 Collin Holt presents today for phlebotomy per MD orders. Phlebotomy procedure started at 1420 and ended at 1443. 500 mls removed. Patient tolerated procedure well. IV needle removed intact.

## 2023-09-08 NOTE — Patient Instructions (Signed)

## 2023-09-23 DIAGNOSIS — Z79899 Other long term (current) drug therapy: Secondary | ICD-10-CM | POA: Diagnosis not present

## 2023-09-23 DIAGNOSIS — I480 Paroxysmal atrial fibrillation: Secondary | ICD-10-CM | POA: Diagnosis not present

## 2023-09-23 DIAGNOSIS — Z125 Encounter for screening for malignant neoplasm of prostate: Secondary | ICD-10-CM | POA: Diagnosis not present

## 2023-09-23 DIAGNOSIS — I10 Essential (primary) hypertension: Secondary | ICD-10-CM | POA: Diagnosis not present

## 2023-09-23 DIAGNOSIS — E538 Deficiency of other specified B group vitamins: Secondary | ICD-10-CM | POA: Diagnosis not present

## 2023-09-23 DIAGNOSIS — Z13220 Encounter for screening for lipoid disorders: Secondary | ICD-10-CM | POA: Diagnosis not present

## 2023-09-23 DIAGNOSIS — R7303 Prediabetes: Secondary | ICD-10-CM | POA: Diagnosis not present

## 2023-09-30 ENCOUNTER — Ambulatory Visit: Attending: Student | Admitting: Student

## 2023-09-30 ENCOUNTER — Telehealth: Payer: Self-pay | Admitting: Cardiovascular Disease

## 2023-09-30 ENCOUNTER — Encounter: Payer: Self-pay | Admitting: Student

## 2023-09-30 VITALS — BP 122/84 | HR 135 | Ht 72.0 in | Wt 280.2 lb

## 2023-09-30 DIAGNOSIS — I10 Essential (primary) hypertension: Secondary | ICD-10-CM

## 2023-09-30 DIAGNOSIS — G4733 Obstructive sleep apnea (adult) (pediatric): Secondary | ICD-10-CM | POA: Diagnosis not present

## 2023-09-30 DIAGNOSIS — F1090 Alcohol use, unspecified, uncomplicated: Secondary | ICD-10-CM | POA: Diagnosis not present

## 2023-09-30 DIAGNOSIS — Z789 Other specified health status: Secondary | ICD-10-CM | POA: Diagnosis not present

## 2023-09-30 DIAGNOSIS — I480 Paroxysmal atrial fibrillation: Secondary | ICD-10-CM

## 2023-09-30 NOTE — Progress Notes (Signed)
 Cardiology Clinic Note   Date: 09/30/2023 ID: Collin Holt, DOB 05/10/1960, MRN [id number]  Primary Cardiologist:  Evalene Lunger, MD  Chief Complaint   Collin Holt is a 63 y.o. male who presents to the clinic today for evaluation of palpitations.   Patient Profile   [name] is followed by Dr. Gollan for the history outlined below.       Past medical history significant for: PAF. Echo 04/22/2021: EF 55 to 60%.  No RWMA.  Mild LVH.  Indeterminate diastolic parameters.  Normal RV size/function.  Trivial MR.  Mild AI.  DCCV 05/01/2021. Hypertension. OSA. CPAP 100% adherence.   In summary, patient was previously followed by Dr. Debby Core for A-fib.  He had an isolated A-fib event at age 71 with no provoking factors.  He established care with Dr. Gollan in August 2011.  Echo in January 2012 demonstrated EF 55 to 65%, no RWMA, normal diastolic parameters, trivial AI, moderate LAE.  Stress test showed excellent functional capacity with no cardiac limitation.  Prior cardiac catheterization in 2012 showed no significant CAD.  Patient was seen in April 2023 with reports of A-fib with rapid rate.  EKG demonstrated A-fib with rate of 140.  He continued daily use of alcohol.  He underwent cardioversion on 05/01/2021.  Upon follow-up he was bradycardic metoprolol  dose was decreased.  In May 2023 he was found to be back in A-fib with RVR.  He was started on flecainide  and metoprolol  was increased.  Upon follow-up in 2023 he was maintaining sinus rhythm.  Patient was last seen in the office by Dr. Gollan on 02/09/2022 for routine follow-up.  He was maintaining sinus rhythm at that time.  Alcohol cessation was recommended.  He was instructed to increase Toprol  to 1 tablet.  BP was elevated at the time of his visit.  He declined any other medications for BP control.   Patient was last seen in the office by me on 05/13/2023 for routine follow-up.  He reported continued brief episodes of A-fib  lasting a couple minutes.  He was drinking 4 to 5 cups of caffeine in the mornings and 2 to 3 glasses of wine in the evenings.  He reported if he consumed more alcohol than normal he would usually have an episode of A-fib.  He was encouraged to decrease caffeine and alcohol intake.  Patient contacted the office on 09/30/2023 with report of 2-days of A-fib.  Heart rate running 80 to 124 bpm.     History of Present Illness    Today, patient states he believes he went into afib yesterday morning. He had a run of afib the night prior that was brief. He endorses increased alcohol intake 1 week ago. He does not have palpitations with afib but does experience increased dyspnea. He is 100% adherent with CPAP. He denies chest pain, pressure or tightness. No lower extremity edema. He has not missed any doses of Xarelto .    ROS: All other systems reviewed and are otherwise negative except as noted in History of Present Illness.  EKGs/Labs Reviewed    EKG Interpretation Date/Time:  Friday September 30 2023 14:48:35 EDT Ventricular Rate:  135 PR Interval:    QRS Duration:  100 QT Interval:  328 QTC Calculation: 492 R Axis:   -53  Text Interpretation: Atrial fibrillation with rapid ventricular response Left axis deviation Minimal voltage criteria for LVH, may be normal variant ( Cornell product ) When compared with ECG of 13-May-2023 10:33,  Atrial fibrillation has replaced Sinus rhythm Vent. rate has increased BY  76 BPM Nonspecific T wave abnormality no longer evident in Inferior leads Confirmed by Loistine Sober [phone]) on 09/30/2023 2:56:21 PM   09/08/2023: Hemoglobin 14.4; WBC 6.6    Risk Assessment/Calculations     CHA2DS2-VASc Score = 1   This indicates a 0.6% annual risk of stroke. The patient's score is based upon: CHF History: 0 HTN History: 1 Diabetes History: 0 Stroke History: 0 Vascular Disease History: 0 Age Score: 0 Gender Score: 0              Physical Exam    VS:   BP 122/84   Pulse (!) 135   Ht 6' (1.829 m)   Wt 280 lb 3.2 oz (127.1 kg)   SpO2 96%   BMI 38.00 kg/m  , BMI Body mass index is 38 kg/m.  GEN: Well nourished, well developed, in no acute distress. Neck: No JVD or carotid bruits. Cardiac: Irregularly irregular rhythm.  No murmur. No rubs or gallops.   Respiratory:  Respirations regular and unlabored. Clear to auscultation without rales, wheezing or rhonchi. GI: Soft, nontender, nondistended. Extremities: Radials/DP/PT 2+ and equal bilaterally. No clubbing or cyanosis. No edema.  Skin: Warm and dry, no rash. Neuro: Strength intact.  Assessment & Plan   PAF Echo April 2023 showed normal LV/RV function, mild LVH, trivial MR, mild AI.  Denies spontaneous bleeding concerns.  Patient reports he went into afib yesterday morning. He does not have palpitations when he is in afib but feels short of breath. EKG demonstrates afib 135 bpm. He has not missed any doses of Xarelto . He has not been taking flecainide .  - Continue Xarelto . - Increase Toprol  25 mg bid.  - Schedule cardioversion. - Refer to EP.    Hypertension BP today 122/84. No report of headaches.  - Continue losartan, Toprol .   OSA Patient reports 100% adherence with CPAP. - Continue use of CPAP.   Caffeine/Alcohol use Patient reports drinking 4-5 cups of coffee each morning and 2-3 glasses of wine a night. Discussed the impact this has on afib. He voiced understanding. -Encouraged decreasing caffeine intake and cutting back on alcohol use.   Disposition: Increase Toprol  25 mg to bid dosing. Schedule cardioversion. Refer to EP.      Informed Consent   Shared Decision Making/Informed Consent The risks (stroke, cardiac arrhythmias rarely resulting in the need for a temporary or permanent pacemaker, skin irritation or burns and complications associated with conscious sedation including aspiration, arrhythmia, respiratory failure and death), benefits (restoration of normal  sinus rhythm) and alternatives of a direct current cardioversion were explained in detail to [name] and he agrees to proceed.        Signed, Sober HERO. Blakleigh Straw, DNP, NP-C

## 2023-09-30 NOTE — Telephone Encounter (Signed)
 Patient c/o Palpitations:  STAT if patient reporting lightheadedness, shortness of breath, or chest pain  How long have you had palpitations/irregular HR/ Afib? Are you having the symptoms now? Afib for about 2 days   Are you currently experiencing lightheadedness, SOB or CP? No   Do you have a history of afib (atrial fibrillation) or irregular heart rhythm? Yes   Have you checked your BP or HR? (document readings if available): 86 currently, states he has been going up and down between 80-124   Are you experiencing any other symptoms? SOB only with exertion

## 2023-09-30 NOTE — Telephone Encounter (Signed)
 Called and spoke with patient. Patient states that he has been in atrial fibrillation for 2 days. Patient reports only symptom is shortness of breath with exertion. Patient reports heart rates running 80-124. Patient reports current blood pressure 138/83. Patient previously scheduled to be seen in clinic today at 2:45 pm.

## 2023-09-30 NOTE — H&P (View-Only) (Signed)
 Cardiology Clinic Note   Date: 09/30/2023 ID: EMAN MORIMOTO, DOB 05/10/1960, MRN [id number]  Primary Cardiologist:  Collin Lunger, MD  Chief Complaint   Collin Holt is a 63 y.o. male who presents to the clinic today for evaluation of palpitations.   Patient Profile   Collin Holt is followed by Dr. Gollan for the history outlined below.       Past medical history significant for: PAF. Echo 04/22/2021: EF 55 to 60%.  No RWMA.  Mild LVH.  Indeterminate diastolic parameters.  Normal RV size/function.  Trivial MR.  Mild AI.  DCCV 05/01/2021. Hypertension. OSA. CPAP 100% adherence.   In summary, patient was previously followed by Dr. Debby Holt for A-fib.  He had an isolated A-fib event at age 71 with no provoking factors.  He established care with Dr. Gollan in August 2011.  Echo in January 2012 demonstrated EF 55 to 65%, no RWMA, normal diastolic parameters, trivial AI, moderate LAE.  Stress test showed excellent functional capacity with no cardiac limitation.  Prior cardiac catheterization in 2012 showed no significant CAD.  Patient was seen in April 2023 with reports of A-fib with rapid rate.  EKG demonstrated A-fib with rate of 140.  He continued daily use of alcohol.  He underwent cardioversion on 05/01/2021.  Upon follow-up he was bradycardic metoprolol  dose was decreased.  In May 2023 he was found to be back in A-fib with RVR.  He was started on flecainide  and metoprolol  was increased.  Upon follow-up in 2023 he was maintaining sinus rhythm.  Patient was last seen in the office by Dr. Gollan on 02/09/2022 for routine follow-up.  He was maintaining sinus rhythm at that time.  Alcohol cessation was recommended.  He was instructed to increase Toprol  to 1 tablet.  BP was elevated at the time of his visit.  He declined any other medications for BP control.   Patient was last seen in the office by me on 05/13/2023 for routine follow-up.  He reported continued brief episodes of A-fib  lasting a couple minutes.  He was drinking 4 to 5 cups of caffeine in the mornings and 2 to 3 glasses of wine in the evenings.  He reported if he consumed more alcohol than normal he would usually have an episode of A-fib.  He was encouraged to decrease caffeine and alcohol intake.  Patient contacted the office on 09/30/2023 with report of 2-days of A-fib.  Heart rate running 80 to 124 bpm.     History of Present Illness    Today, patient states he believes he went into afib yesterday morning. He had a run of afib the night prior that was brief. He endorses increased alcohol intake 1 week ago. He does not have palpitations with afib but does experience increased dyspnea. He is 100% adherent with CPAP. He denies chest pain, pressure or tightness. No lower extremity edema. He has not missed any doses of Xarelto .    ROS: All other systems reviewed and are otherwise negative except as noted in History of Present Illness.  EKGs/Labs Reviewed    EKG Interpretation Date/Time:  Friday September 30 2023 14:48:35 EDT Ventricular Rate:  135 PR Interval:    QRS Duration:  100 QT Interval:  328 QTC Calculation: 492 R Axis:   -53  Text Interpretation: Atrial fibrillation with rapid ventricular response Left axis deviation Minimal voltage criteria for LVH, may be normal variant ( Cornell product ) When compared with ECG of 13-May-2023 10:33,  Atrial fibrillation has replaced Sinus rhythm Vent. rate has increased BY  76 BPM Nonspecific T wave abnormality no longer evident in Inferior leads Confirmed by Collin Holt [phone]) on 09/30/2023 2:56:21 PM   09/08/2023: Hemoglobin 14.4; WBC 6.6    Risk Assessment/Calculations     CHA2DS2-VASc Score = 1   This indicates a 0.6% annual risk of stroke. The patient's score is based upon: CHF History: 0 HTN History: 1 Diabetes History: 0 Stroke History: 0 Vascular Disease History: 0 Age Score: 0 Gender Score: 0              Physical Exam    VS:   BP 122/84   Pulse (!) 135   Ht 6' (1.829 m)   Wt 280 lb 3.2 oz (127.1 kg)   SpO2 96%   BMI 38.00 kg/m  , BMI Body mass index is 38 kg/m.  GEN: Well nourished, well developed, in no acute distress. Neck: No JVD or carotid bruits. Cardiac: Irregularly irregular rhythm.  No murmur. No rubs or gallops.   Respiratory:  Respirations regular and unlabored. Clear to auscultation without rales, wheezing or rhonchi. GI: Soft, nontender, nondistended. Extremities: Radials/DP/PT 2+ and equal bilaterally. No clubbing or cyanosis. No edema.  Skin: Warm and dry, no rash. Neuro: Strength intact.  Assessment & Plan   PAF Echo April 2023 showed normal LV/RV function, mild LVH, trivial MR, mild AI.  Denies spontaneous bleeding concerns.  Patient reports he went into afib yesterday morning. He does not have palpitations when he is in afib but feels short of breath. EKG demonstrates afib 135 bpm. He has not missed any doses of Xarelto . He has not been taking flecainide .  - Continue Xarelto . - Increase Toprol  25 mg bid.  - Schedule cardioversion. - Refer to EP.    Hypertension BP today 122/84. No report of headaches.  - Continue losartan, Toprol .   OSA Patient reports 100% adherence with CPAP. - Continue use of CPAP.   Caffeine/Alcohol use Patient reports drinking 4-5 cups of coffee each morning and 2-3 glasses of wine a night. Discussed the impact this has on afib. He voiced understanding. -Encouraged decreasing caffeine intake and cutting back on alcohol use.   Disposition: Increase Toprol  25 mg to bid dosing. Schedule cardioversion. Refer to EP.      Informed Consent   Shared Decision Making/Informed Consent The risks (stroke, cardiac arrhythmias rarely resulting in the need for a temporary or permanent pacemaker, skin irritation or burns and complications associated with conscious sedation including aspiration, arrhythmia, respiratory failure and death), benefits (restoration of normal  sinus rhythm) and alternatives of a direct current cardioversion were explained in detail to [name] and he agrees to proceed.        Signed, Holt HERO. Blakleigh Straw, DNP, NP-C

## 2023-09-30 NOTE — Patient Instructions (Addendum)
 Medication Instructions:  Your physician recommends that you continue on your current medications as directed. Please refer to the Current Medication list given to you today.   *If you need a refill on your cardiac medications before your next appointment, please call your pharmacy*  Lab Work: None ordered at this time  If you have labs (blood work) drawn today and your tests are completely normal, you will receive your results only by: MyChart Message (if you have MyChart) OR A paper copy in the mail If you have any lab test that is abnormal or we need to change your treatment, we will call you to review the results.  Testing/Procedures:     Dear Collin Holt  You are scheduled for a Cardioversion on Monday, September 15 with Dr. Darliss.  Please arrive at the [facility] Entrance of [facility], [address], [place] [address] at 6:30 AM (This is 1  hour(s) prior to your procedure time).  Proceed to the Check-In Desk directly inside the entrance.  Procedure Parking: Use the entrance off of the [address] side of the hospital. Turn right upon entering and follow the driveway to parking that is directly in front of the [facility]. There is no valet parking available at this entrance, however there is an awning directly in front of the [facility] for drop off/ pick up for patients.    DIET:  Nothing to eat or drink after midnight except a sip of water with medications (see medication instructions below)  MEDICATION INSTRUCTIONS: !!IF ANY NEW MEDICATIONS ARE STARTED AFTER TODAY, PLEASE NOTIFY YOUR PROVIDER AS SOON AS POSSIBLE!!  FYI: Medications such as Semaglutide (Ozempic, [place]), Tirzepatide (Mounjaro, Zepbound), Dulaglutide (Trulicity), etc (GLP1 agonists) AND Canagliflozin (Invokana), Dapagliflozin (Farxiga), Empagliflozin (Jardiance), Ertugliflozin (Steglatro), Bexagliflozin [employer]) or any combination with one of these drugs such as  Invokamet (Canagliflozin/Metformin), Synjardy (Empagliflozin/Metformin), etc (SGLT2 inhibitors) must be held around the time of a procedure. This is not a comprehensive list of all of these drugs. Please review all of your medications and talk to your provider if you take any one of these. If you are not sure, ask your provider.    Continue taking your anticoagulant (blood thinner): Rivaroxaban  (Xarelto ).  You will need to continue this after your procedure until you are told by your provider that it is safe to stop.    LABS:    No Labs needed at this time.  FYI:  For your safety, and to allow us  to monitor your vital signs accurately during the surgery/procedure we request: If you have artificial nails, gel coating, SNS etc, please have those removed prior to your surgery/procedure. Not having the nail coverings /polish removed may result in cancellation or delay of your surgery/procedure.  Your support person will be asked to wait in the waiting room during your procedure.  It is OK to have someone drop you off and come back when you are ready to be discharged.  You cannot drive after the procedure and will need someone to drive you home.  Bring your insurance cards.  *Special Note: Every effort is made to have your procedure done on time. Occasionally there are emergencies that occur at the hospital that may cause delays. Please be patient if a delay does occur.      Follow-Up:  Your cardiologist has referred you to Electrophysiology  We have attached their office location and phone number below.  Please allow them 3-5 business  days to reach out to you to make an appointment.  If you have not heard from their office within that time, please call them to schedule your appointment.  At [facility], you and your health needs are our priority.  As part of our continuing mission to provide you with exceptional heart care, our providers are all part of one team.  This team includes  your primary Cardiologist (physician) and Advanced Practice Providers or APPs (Physician Assistants and Nurse Practitioners) who all work together to provide you with the care you need, when you need it.  Your next appointment:   1 month(s)  Provider:   Ole Holts, MD or Fonda Kitty, MD    We recommend signing up for the patient portal called MyChart.  Sign up information is provided on this After Visit Summary.  MyChart is used to connect with patients for Virtual Visits (Telemedicine).  Patients are able to view lab/test results, encounter notes, upcoming appointments, etc.  Non-urgent messages can be sent to your provider as well.   To learn more about what you can do with MyChart, go to [email or web address].

## 2023-10-02 ENCOUNTER — Encounter: Payer: Self-pay | Admitting: Cardiovascular Disease

## 2023-10-02 MED ORDER — SODIUM CHLORIDE 0.9 % IV SOLN: INTRAVENOUS | Status: DC

## 2023-10-03 ENCOUNTER — Ambulatory Visit: Admitting: Anesthesiology

## 2023-10-03 ENCOUNTER — Encounter
Admission: RE | Disposition: A | Discharge status: Home / Self Care | Payer: Self-pay | Source: Home / Self Care | Attending: Cardiology

## 2023-10-03 ENCOUNTER — Ambulatory Visit
Admission: RE | Admit: 2023-10-03 | Discharge: 2023-10-03 | Disposition: A | Discharge status: Home / Self Care | Attending: Cardiology | Admitting: Cardiology

## 2023-10-03 DIAGNOSIS — I480 Paroxysmal atrial fibrillation: Secondary | ICD-10-CM | POA: Insufficient documentation

## 2023-10-03 DIAGNOSIS — I119 Hypertensive heart disease without heart failure: Secondary | ICD-10-CM | POA: Diagnosis not present

## 2023-10-03 DIAGNOSIS — Z7901 Long term (current) use of anticoagulants: Secondary | ICD-10-CM | POA: Insufficient documentation

## 2023-10-03 DIAGNOSIS — G4733 Obstructive sleep apnea (adult) (pediatric): Secondary | ICD-10-CM | POA: Diagnosis not present

## 2023-10-03 DIAGNOSIS — Z538 Procedure and treatment not carried out for other reasons: Secondary | ICD-10-CM | POA: Insufficient documentation

## 2023-10-03 HISTORY — PX: CARDIOVERSION: SHX1299

## 2023-10-03 SURGERY — CARDIOVERSION
Anesthesia: General

## 2023-10-03 NOTE — Anesthesia Preprocedure Evaluation (Signed)
 Anesthesia Evaluation  Patient identified by MRN, date of birth, ID band Patient awake    Reviewed: Allergy & Precautions, NPO status , Patient's Chart, lab work & pertinent test results  History of Anesthesia Complications Negative for: history of anesthetic complications  Airway Mallampati: III  TM Distance: >3 FB Neck ROM: full    Dental  (+) Chipped   Pulmonary sleep apnea , former smoker   Pulmonary exam normal        Cardiovascular hypertension, On Medications + DOE  + dysrhythmias Atrial Fibrillation      Neuro/Psych  Headaches PSYCHIATRIC DISORDERS Anxiety Depression       GI/Hepatic negative GI ROS, Neg liver ROS,,,  Endo/Other  negative endocrine ROSdiabetes, Well Controlled, Type 2, Oral Hypoglycemic Agents    Renal/GU negative Renal ROS  negative genitourinary   Musculoskeletal   Abdominal   Peds  Hematology negative hematology ROS (+)   Anesthesia Other Findings Past Medical History: No date: Allergic rhinitis No date: Anxiety No date: B12 deficiency No date: Benign paroxysmal vertigo No date: Chronic headaches No date: Depression No date: Diabetes mellitus without complication (HCC) No date: DOE (dyspnea on exertion)     Comment:  a. 02/2010 Cath: nl cors;  b. 02/2010 CPX: good fxnl               capacity. No date: Dysrhythmia No date: Hereditary hemochromatosis (HCC) No date: HTN (hypertension) No date: OSA (obstructive sleep apnea) No date: PAF (paroxysmal atrial fibrillation) (HCC)     Comment:  a. 2012 Echo: nl LV fxn, mod dil LA. 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 No date: Pneumonia No date: Sleep apnea No date: Tinnitus  Past Surgical History: 05/01/2021: CARDIOVERSION; N/A     Comment:  Procedure: CARDIOVERSION;  Surgeon: Perla Evalene PARAS,               MD;  Location: [facility] ORS;  Service: Cardiovascular;                Laterality: N/A; 07/23/2020: COLONOSCOPY WITH PROPOFOL ; N/A      Comment:  Procedure: COLONOSCOPY WITH PROPOFOL ;  Surgeon: [place],               Ladell POUR, MD;  Location: [facility] ENDOSCOPY;  Service:               Gastroenterology;  Laterality: N/A; No date: SHOULDER SURGERY     Comment:  Right x 2     Reproductive/Obstetrics negative OB ROS                              Anesthesia Physical Anesthesia Plan  ASA: 3  Anesthesia Plan: General   Post-op Pain Management: Minimal or no pain anticipated   Induction: Intravenous  PONV Risk Score and Plan: 1 and Propofol  infusion and TIVA  Airway Management Planned: Natural Airway and Nasal Cannula  Additional Equipment:   Intra-op Plan:   Post-operative Plan:   Informed Consent: I have reviewed the patients History and Physical, chart, labs and discussed the procedure including the risks, benefits and alternatives for the proposed anesthesia with the patient or authorized representative who has indicated his/her understanding and acceptance.     Dental Advisory Given  Plan Discussed with: Anesthesiologist, CRNA and Surgeon  Anesthesia Plan Comments: (Patient consented for risks of anesthesia including but not limited to:  - adverse reactions to medications - damage to eyes, teeth, lips or other oral mucosa -  nerve damage due to positioning  - sore throat or hoarseness - Damage to heart, brain, nerves, lungs, other parts of body or loss of life  Patient voiced understanding and assent.)        Anesthesia Quick Evaluation

## 2023-10-03 NOTE — Progress Notes (Signed)
 Patient rhythm is SB, provider notified, procedure canceled.

## 2023-10-03 NOTE — Interval H&P Note (Signed)
 History and Physical Interval Note:  10/03/2023 7:46 AM  Collin Holt  has presented today for surgery, with the diagnosis of persistent  Afib.  Cardioversion initially planned.   EKG on presentation showed normal sinus rhythm. DC cardioversion was therefore no performed.  Patient to f/u in the clinic and EP for continued management.     Redell Agbor-Etang

## 2023-10-04 ENCOUNTER — Encounter: Payer: Self-pay | Admitting: Cardiology

## 2023-10-05 ENCOUNTER — Ambulatory Visit
Admission: EM | Admit: 2023-10-05 | Discharge: 2023-10-05 | Disposition: A | Discharge status: Home / Self Care | Attending: Physician Assistant | Admitting: Physician Assistant

## 2023-10-05 ENCOUNTER — Encounter: Payer: Self-pay | Admitting: Emergency Medicine

## 2023-10-05 ENCOUNTER — Encounter: Payer: Self-pay | Admitting: Oncology

## 2023-10-05 ENCOUNTER — Other Ambulatory Visit: Payer: Self-pay | Admitting: *Deleted

## 2023-10-05 DIAGNOSIS — I10 Essential (primary) hypertension: Secondary | ICD-10-CM

## 2023-10-05 DIAGNOSIS — H6501 Acute serous otitis media, right ear: Secondary | ICD-10-CM

## 2023-10-05 DIAGNOSIS — R42 Dizziness and giddiness: Secondary | ICD-10-CM

## 2023-10-05 LAB — GLUCOSE, CAPILLARY: Glucose-Capillary: 119 mg/dL — ABNORMAL HIGH (ref 70–99)

## 2023-10-05 MED ORDER — IPRATROPIUM BROMIDE 0.06 % NA SOLN: 2.0000 | Freq: Four times a day (QID) | NASAL | 0 refills | Status: AC

## 2023-10-05 MED ORDER — MECLIZINE HCL 25 MG PO TABS: 25.0000 mg | ORAL_TABLET | Freq: Three times a day (TID) | ORAL | 0 refills | Status: AC | PRN

## 2023-10-05 NOTE — ED Triage Notes (Signed)
 Pt presents with dizziness x 3 days. Pt denies any other symptoms.

## 2023-10-05 NOTE — Discharge Instructions (Addendum)
-   EKG looks good today. - Blood sugar is 119 which is not significantly concerning. - You have a lot of fluid behind the right eardrum. - I sent medication to the pharmacy to help with this. - Be careful position changes and hydrate well. - Keep your follow-up appointment with cardiology in a few days. - If you start to have any associated headaches, vision changes, numbness/tingling, facial drooping, vomiting, chest pain, palpitations, shortness of breath, difficulty with balance or speech, falls, feeling faint, passing out, please call 911 or have someone take you immediately to the ER.

## 2023-10-05 NOTE — ED Provider Notes (Signed)
 [facility]    CSN: [id number] Arrival date & time: 10/05/23  1510      History   Chief Complaint Chief Complaint  Patient presents with   Dizziness    HPI Collin Holt is a 63 y.o. male with history of paroxysmal atrial fibrillation, hereditary hemochromatosis, hypertension, obstructive sleep apnea, anxiety, chronic headaches, and diabetes.  He presents today for intermittent dizziness for the past 3 days. He has a history of dizziness. He denies headaches, vision changes, numbness/tingling, facial drooping, vomiting, chest pain, palpitations, shortness of breath, difficulty with balance or speech, falls, pre-syncope/syncope. Dizziness is worse with position changes.   Patient seen by cardiology 2 days ago for a-fib. He was going to have cardioversion when he spontaneously converted back to sinus rhythm. Has an appointment for dizziness with cardiology in a few days.   HPI  Past Medical History:  Diagnosis Date   Allergic rhinitis    Anxiety    B12 deficiency    Benign paroxysmal vertigo    Chronic headaches    Depression    Diabetes mellitus without complication (HCC)    DOE (dyspnea on exertion)    a. 02/2010 Cath: nl cors;  b. 02/2010 CPX: good fxnl capacity.   Dysrhythmia    Hereditary hemochromatosis (HCC)    HTN (hypertension)    OSA (obstructive sleep apnea)    PAF (paroxysmal atrial fibrillation) (HCC)    a. 2012 Echo: nl LV fxn, mod dil LA.   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/23/2017   Pneumonia 02/23/2017   Lipid screening 09/02/2016   Morbid obesity with BMI of 40.0-44.9, adult (HCC) 08/21/2015   Fatty  infiltration of liver 08/11/2015   Morbid obesity due to excess calories (HCC) 07/18/2015   Benign paroxysmal vertigo 03/14/2015   Noise effect on both inner ears 03/14/2015   Positional vertigo 03/14/2015   Hemochromatosis 01/15/2015   Elevated liver enzymes 07/18/2014   Testosterone  deficiency 07/18/2014   Dizziness 05/25/2011   Depression 04/13/2011   Migraine with vertigo 04/13/2011   OSA (obstructive sleep apnea) 07/31/2010   Headache, common migraine 05/01/2010   SINUSITIS - ACUTE-NOS 04/04/2010   CHEST PAIN-UNSPECIFIED 01/16/2010   CHEST PAIN-PRECORDIAL 01/16/2010   DYSPNEA ON EXERTION 01/05/2010   HYPERTENSION, BENIGN 07/27/2008   Paroxysmal atrial fibrillation (HCC) 07/27/2008   PALPITATIONS 07/27/2008    Past Surgical History:  Procedure Laterality Date   CARDIOVERSION N/A 05/01/2021   Procedure: CARDIOVERSION;  Surgeon: Perla Evalene PARAS, MD;  Location: [facility];  Service: Cardiovascular;  Laterality: N/A;   CARDIOVERSION N/A 10/03/2023   Procedure: CARDIOVERSION;  Surgeon: Darliss Rogue, MD;  Location: [facility];  Service: Cardiovascular;  Laterality: N/A;   COLONOSCOPY WITH PROPOFOL  N/A 07/23/2020   Procedure: COLONOSCOPY WITH PROPOFOL ;  Surgeon: Toledo, Ladell POUR, MD;  Location: [facility];  Service: Gastroenterology;  Laterality: N/A;   SHOULDER SURGERY     Right x 2       Home Medications    Prior to Admission medications   Medication Sig Start Date End Date Taking? Authorizing Provider  ipratropium (ATROVENT ) 0.06 % nasal spray Place 2 sprays into both nostrils 4 (  four) times daily. 10/05/23  Yes Arvis Jolan NOVAK, PA-C  meclizine  (ANTIVERT ) 25 MG tablet Take 1 tablet (25 mg total) by mouth 3 (three) times daily as needed for dizziness. 10/05/23  Yes Arvis Jolan B, PA-C  cyanocobalamin  (,VITAMIN B-12,) 1000 MCG/ML injection Inject 1,000 mcg into the muscle every 14 (fourteen) days. 07/19/19   [provider]  flecainide  (TAMBOCOR ) 100 MG tablet  Take 1 tablet (100 mg total) by mouth 2 (two) times daily as needed. 02/09/22   Gollan, Timothy J, MD  losartan (COZAAR) 50 MG tablet Take 50 mg by mouth daily.    [provider]  metFORMIN (GLUCOPHAGE) 500 MG tablet Take 500 mg by mouth 2 (two) times daily with a meal.    [provider]  metoprolol  succinate (TOPROL  XL) 25 MG 24 hr tablet Take 1 tablet (25 mg) by mouth once daily. Take with or immediately following a meal. 11/24/22   Gollan, Timothy J, MD  ondansetron (ZOFRAN-ODT) 4 MG disintegrating tablet Take 4 mg by mouth every 8 (eight) hours as needed for nausea or vomiting.    [provider]  SUMAtriptan (IMITREX) 100 MG tablet Take 100 mg by mouth every 2 (two) hours as needed for migraine. 05/05/12   [provider]  tadalafil (CIALIS) 20 MG tablet Take 20 mg by mouth daily as needed for erectile dysfunction. 02/15/20   [provider]  venlafaxine XR (EFFEXOR-XR) 75 MG 24 hr capsule Take 75 mg by mouth daily with breakfast.    [provider]  XARELTO  20 MG TABS tablet TAKE ONE TABLET BY MOUTH ONCE A DAY WITH SUPPER 08/10/23   Gollan, Timothy J, MD    Family History Family History  Problem Relation Age of Onset   Emphysema Father    Cancer Father    Prostate cancer Neg Hx    Colon cancer Neg Hx    Coronary artery disease Neg Hx    Diabetes Neg Hx     Social History Social History   Tobacco Use   Smoking status: Former    Current packs/day: 0.00    Average packs/day: 1 pack/day for 10.0 years (10.0 ttl pk-yrs)    Types: Cigarettes    Start date: 01/18/1989    Quit date: 01/19/1999    Years since quitting: 24.7   Smokeless tobacco: Former    Quit date: 01/19/2011  Vaping Use   Vaping status: Never Used  Substance Use Topics   Alcohol use: Yes    Alcohol/week: 4.0 standard drinks of alcohol    Types: 4 Glasses of wine per week    Comment: daily   Drug use: No     Allergies   Morphine and codeine and  Terfenadine   Review of Systems Review of Systems  Constitutional:  Negative for fatigue.  HENT:  Negative for congestion, ear pain, sinus pressure and sore throat.   Eyes:  Negative for visual disturbance.  Respiratory:  Negative for shortness of breath.   Cardiovascular:  Negative for chest pain and palpitations.  Gastrointestinal:  Negative for abdominal pain, nausea and vomiting.  Neurological:  Positive for dizziness. Negative for tremors, seizures, syncope, facial asymmetry, speech difficulty, weakness, numbness and headaches.  Psychiatric/Behavioral:  Negative for confusion.      Physical Exam Triage Vital Signs ED Triage Vitals  Encounter Vitals Group     BP 10/05/23 1608 (!) 145/78     Girls Systolic BP Percentile --      Girls Diastolic BP Percentile --  Boys Systolic BP Percentile --      Boys Diastolic BP Percentile --      Pulse Rate 10/05/23 1608 (!) 56     Resp 10/05/23 1608 18     Temp 10/05/23 1608 98.7 F (37.1 C)     Temp Source 10/05/23 1608 Oral     SpO2 10/05/23 1608 96 %     Weight --      Height --      Head Circumference --      Peak Flow --      Pain Score 10/05/23 1607 0     Pain Loc --      Pain Education --      Exclude from Growth Chart --    No data found.  Updated Vital Signs BP (!) 145/78 (BP Location: Left Arm)   Pulse (!) 56   Temp 98.7 F (37.1 C) (Oral)   Resp 18   SpO2 96%      Physical Exam Vitals and nursing note reviewed.  Constitutional:      General: He is not in acute distress.    Appearance: Normal appearance. He is well-developed. He is not ill-appearing.  HENT:     Head: Normocephalic and atraumatic.     Right Ear: Ear canal and external ear normal. A middle ear effusion is present. Tympanic membrane is bulging.     Left Ear: Ear canal and external ear normal. A middle ear effusion is present.     Nose: Nose normal.     Mouth/Throat:     Mouth: Mucous membranes are moist.     Pharynx: Oropharynx is  clear.  Eyes:     General: No scleral icterus.    Extraocular Movements: Extraocular movements intact.     Conjunctiva/sclera: Conjunctivae normal.     Pupils: Pupils are equal, round, and reactive to light.  Cardiovascular:     Rate and Rhythm: Regular rhythm. Bradycardia present.  Pulmonary:     Effort: Pulmonary effort is normal. No respiratory distress.     Breath sounds: Normal breath sounds.  Musculoskeletal:     Cervical back: Neck supple.  Skin:    General: Skin is warm and dry.     Capillary Refill: Capillary refill takes less than 2 seconds.  Neurological:     General: No focal deficit present.     Mental Status: He is alert and oriented to person, place, and time. Mental status is at baseline.     Cranial Nerves: No cranial nerve deficit.     Motor: No weakness.     Coordination: Coordination normal.     Gait: Gait normal.     Comments: Full strength upper and lower extremities  Psychiatric:        Mood and Affect: Mood normal.        Behavior: Behavior normal.      UC Treatments / Results  Labs (all labs ordered are listed, but only abnormal results are displayed) Labs Reviewed  GLUCOSE, CAPILLARY - Abnormal; Notable for the following components:      Result Value   Glucose-Capillary 119 (*)    All other components within normal limits  CBG MONITORING, ED    EKG   Radiology No results found.  Procedures ED EKG  Date/Time: 10/05/2023 5:36 PM  Performed by: Arvis Jolan NOVAK, PA-C Authorized by: Arvis Jolan NOVAK, PA-C   Previous ECG:    Previous ECG:  Compared to current   Comparison ECG info:  No significant changes Interpretation:    Interpretation: abnormal   Rate:    ECG rate:  54   ECG rate assessment: bradycardic   Rhythm:    Rhythm: sinus rhythm   Ectopy:    Ectopy: none   QRS:    QRS axis:  Left   QRS intervals:  Normal   QRS conduction: normal   ST segments:    ST segments:  Normal T waves:    T waves: normal   Comments:      Sinus bradycardia  (including critical care time)  Medications Ordered in UC Medications - No data to display  Initial Impression / Assessment and Plan / UC Course  I have reviewed the triage vital signs and the nursing notes.  Pertinent labs & imaging results that were available during my care of the patient were reviewed by me and considered in my medical decision making (see chart for details).   63 year old male with history of atrial fibrillation, obstructive sleep apnea, obesity, hereditary hemochromatosis, fatty liver, migraines and long-term anticoagulation presents for dizziness intermittently x 3 days.  Dizziness is worse with movement.  Denies any associated red flag signs or symptoms.  See HPI.  Blood pressure elevated 145/78.  Pulse reduced at 56 bpm. Other vitals normal.  On exam has large clear effusion with bulging of the right TM and effusion of the left TM.  Normal neurological exam.  Chest clear.  Heart regular rhythm.  EKG performed shows sinus bradycardia.  EKG compared to one from 10/03/2023 shows no significant changes. Reviewed cardiology notes.  Fingerstick glucose 119.  Discussed all results with patient.  Dizziness most likely related to acute serous otitis media.  Sent meclizine  and Atrovent  nasal spray to pharmacy.  Encouraged lots of rest and fluids, careful position changes.  Advised to keep follow-up with cardiology in a few days.  Thoroughly reviewed ED precautions/red flag signs and symptoms and advised him to have a low threshold to go to the ER.   Final Clinical Impressions(s) / UC Diagnoses   Final diagnoses:  Dizziness  Right acute serous otitis media, recurrence not specified  Essential hypertension     Discharge Instructions      - EKG looks good today. - Blood sugar is 119 which is not significantly concerning. - You have a lot of fluid behind the right eardrum. - I sent medication to the pharmacy to help with this. - Be careful position  changes and hydrate well. - Keep your follow-up appointment with cardiology in a few days. - If you start to have any associated headaches, vision changes, numbness/tingling, facial drooping, vomiting, chest pain, palpitations, shortness of breath, difficulty with balance or speech, falls, feeling faint, passing out, please call 911 or have someone take you immediately to the ER.     ED Prescriptions     Medication Sig Dispense Auth. Provider   meclizine  (ANTIVERT ) 25 MG tablet Take 1 tablet (25 mg total) by mouth 3 (three) times daily as needed for dizziness. 30 tablet Arvis Huxley B, PA-C   ipratropium (ATROVENT ) 0.06 % nasal spray Place 2 sprays into both nostrils 4 (four) times daily. 15 mL Arvis Huxley NOVAK, PA-C      PDMP not reviewed this encounter.   Arvis Huxley NOVAK, PA-C 10/05/23 1740

## 2023-10-06 ENCOUNTER — Inpatient Hospital Stay

## 2023-10-06 ENCOUNTER — Inpatient Hospital Stay: Attending: Oncology

## 2023-10-06 LAB — CBC WITH DIFFERENTIAL/PLATELET
Abs Immature Granulocytes: 0.03 K/uL (ref 0.00–0.07)
Basophils Absolute: 0 K/uL (ref 0.0–0.1)
Basophils Relative: 1 %
Eosinophils Absolute: 0.2 K/uL (ref 0.0–0.5)
Eosinophils Relative: 3 %
HCT: 39.8 % (ref 39.0–52.0)
Hemoglobin: 13.5 g/dL (ref 13.0–17.0)
Immature Granulocytes: 1 %
Lymphocytes Relative: 29 %
Lymphs Abs: 1.7 K/uL (ref 0.7–4.0)
MCH: 33.8 pg (ref 26.0–34.0)
MCHC: 33.9 g/dL (ref 30.0–36.0)
MCV: 99.5 fL (ref 80.0–100.0)
Monocytes Absolute: 0.7 K/uL (ref 0.1–1.0)
Monocytes Relative: 12 %
Neutro Abs: 3.2 K/uL (ref 1.7–7.7)
Neutrophils Relative %: 54 %
Platelets: 159 K/uL (ref 150–400)
RBC: 4 MIL/uL — ABNORMAL LOW (ref 4.22–5.81)
RDW: 11.8 % (ref 11.5–15.5)
WBC: 5.7 K/uL (ref 4.0–10.5)
nRBC: 0 % (ref 0.0–0.2)

## 2023-10-06 LAB — IRON AND TIBC
Iron: 182 ug/dL (ref 45–182)
Saturation Ratios: 49 % — ABNORMAL HIGH (ref 17.9–39.5)
TIBC: 374 ug/dL (ref 250–450)
UIBC: 192 ug/dL

## 2023-10-06 LAB — FERRITIN: Ferritin: 413 ng/mL — ABNORMAL HIGH (ref 24–336)

## 2023-10-06 NOTE — Progress Notes (Signed)
 Collin Holt presents today for phlebotomy per MD orders. Phlebotomy procedure started at 1443 and ended at 1504. 500 mls removed. Patient tolerated procedure well. IV needle removed intact.

## 2023-10-06 NOTE — Patient Instructions (Signed)

## 2023-11-02 ENCOUNTER — Institutional Professional Consult (permissible substitution): Admitting: Cardiology

## 2023-11-04 ENCOUNTER — Other Ambulatory Visit: Payer: Self-pay | Admitting: *Deleted

## 2023-11-07 ENCOUNTER — Inpatient Hospital Stay: Attending: Oncology

## 2023-11-07 DIAGNOSIS — Z7901 Long term (current) use of anticoagulants: Secondary | ICD-10-CM | POA: Insufficient documentation

## 2023-11-07 DIAGNOSIS — I480 Paroxysmal atrial fibrillation: Secondary | ICD-10-CM | POA: Insufficient documentation

## 2023-11-07 DIAGNOSIS — I10 Essential (primary) hypertension: Secondary | ICD-10-CM | POA: Insufficient documentation

## 2023-11-08 ENCOUNTER — Inpatient Hospital Stay

## 2023-11-08 ENCOUNTER — Encounter: Payer: Self-pay | Admitting: Oncology

## 2023-11-08 ENCOUNTER — Inpatient Hospital Stay: Admitting: Oncology

## 2023-11-08 DIAGNOSIS — Z7901 Long term (current) use of anticoagulants: Secondary | ICD-10-CM | POA: Diagnosis not present

## 2023-11-08 DIAGNOSIS — I480 Paroxysmal atrial fibrillation: Secondary | ICD-10-CM | POA: Diagnosis not present

## 2023-11-08 DIAGNOSIS — I10 Essential (primary) hypertension: Secondary | ICD-10-CM | POA: Diagnosis not present

## 2023-11-08 LAB — CBC WITH DIFFERENTIAL/PLATELET
Abs Immature Granulocytes: 0.02 K/uL (ref 0.00–0.07)
Basophils Absolute: 0 K/uL (ref 0.0–0.1)
Basophils Relative: 1 %
Eosinophils Absolute: 0.2 K/uL (ref 0.0–0.5)
Eosinophils Relative: 3 %
HCT: 42.3 % (ref 39.0–52.0)
Hemoglobin: 14.5 g/dL (ref 13.0–17.0)
Immature Granulocytes: 0 %
Lymphocytes Relative: 26 %
Lymphs Abs: 1.6 K/uL (ref 0.7–4.0)
MCH: 33.4 pg (ref 26.0–34.0)
MCHC: 34.3 g/dL (ref 30.0–36.0)
MCV: 97.5 fL (ref 80.0–100.0)
Monocytes Absolute: 0.7 K/uL (ref 0.1–1.0)
Monocytes Relative: 12 %
Neutro Abs: 3.6 K/uL (ref 1.7–7.7)
Neutrophils Relative %: 58 %
Platelets: 157 K/uL (ref 150–400)
RBC: 4.34 MIL/uL (ref 4.22–5.81)
RDW: 11.7 % (ref 11.5–15.5)
WBC: 6.2 K/uL (ref 4.0–10.5)
nRBC: 0 % (ref 0.0–0.2)

## 2023-11-08 LAB — IRON AND TIBC
Iron: 189 ug/dL — ABNORMAL HIGH (ref 45–182)
Saturation Ratios: 48 % — ABNORMAL HIGH (ref 17.9–39.5)
TIBC: 392 ug/dL (ref 250–450)
UIBC: 203 ug/dL

## 2023-11-08 LAB — FERRITIN: Ferritin: 360 ng/mL — ABNORMAL HIGH (ref 24–336)

## 2023-11-08 NOTE — Progress Notes (Signed)
 Collin Holt [place] presents today for phlebotomy per MD orders. Phlebotomy procedure started at 1546 and ended at 1600.  removed. Patient tolerated procedure well. IV needle removed intact.

## 2023-11-08 NOTE — Progress Notes (Unsigned)
 Electrophysiology Clinic Note    Date:  11/09/2023  Patient ID:  Collin Holt, Collin Holt 08/15/1960, MRN [id number] PCP:  Auston Reyes BIRCH, MD  Cardiologist:  Evalene Lunger, MD  Cardiology APP:  Loistine Sober, NP  Electrophysiology APP:  Janequa Kipnis, NP     Discussed the use of AI scribe software for clinical note transcription with the patient, who gave verbal consent to proceed.   Patient Profile    Chief Complaint: AFib  History of Present Illness: Collin Holt is a 63 y.o. male with PMH notable for parox AFib, HTN, OSA, hereditary hemochromatosis, T2DM, ETOH use, anxiety, depression; seen today for electrophysiology evaluation of AFib.   Afib initially diagnosed at age 73. Recurred in 04/2021 with DCCV, recurred 05/2021. He was started on flecainide  and metop. Afib then recurred again 04/2023 noting paroxysmal episodes during appt with NP [place]. Seemed to correlate with increased ETOH consumption.  He contacted office 09/2023 with 2 days of afib, had stopped flecainide . He was planned for DCCV 9/15 but presented to procedure in sinus rhythm.   On follow-up today, he continues to experience recurrent episodes of atrial fibrillation, believes he has had one episode in the last few weeks that lasted approximately six hours. During these episodes, he experiences palpitations and shortness of breath, with an irregular pulse sensation. He takes flecainide  as needed for persistent episodes. He takes xarelto  daily, no significant bleeding concerns.   He continues to drink excessive red wine and acknowledge it as a trigger for atrial fibrillation episodes. He also consumes five to six cups of strong coffee in the morning. Rare physical activities but recognizes the need to increase regular physical activity. He uses CPAP diligently.      Arrhythmia/Device History Flecainide  - PRN (rx'd by Lunger)    ROS:  Please see the history of present illness. All other systems are  reviewed and otherwise negative.    Physical Exam    VS:  BP 122/68   Pulse 67   Ht 6' (1.829 m)   Wt 284 lb 9.6 oz (129.1 kg)   SpO2 99%   BMI 38.60 kg/m  BMI: Body mass index is 38.6 kg/m.           Wt Readings from Last 3 Encounters:  11/09/23 284 lb 9.6 oz (129.1 kg)  11/08/23 285 lb (129.3 kg)  09/30/23 280 lb 3.2 oz (127.1 kg)     GEN- The patient is well appearing, alert and oriented x 3 today.   Lungs- Clear to ausculation bilaterally, normal work of breathing.  Heart- Regular rate and rhythm, no murmurs, rubs or gallops Extremities- No peripheral edema, warm, dry   Studies Reviewed   Previous EP, cardiology notes.    EKG is not ordered. Personal review of EKG from 10/05/2023 shows:  SB at 54, LAD        TTE, 04/22/2021  1. Left ventricular ejection fraction, by estimation, is 55 to 60%. The left ventricle has normal function. The left ventricle has no regional wall motion abnormalities. There is mild left ventricular hypertrophy. Left ventricular diastolic parameters are indeterminate.   2. Right ventricular systolic function is normal. The right ventricular size is normal.   3. The mitral valve is normal in structure. Trivial mitral valve regurgitation.   4. The aortic valve is tricuspid. Aortic valve regurgitation is mild.   5. The inferior vena cava is normal in size with <50% respiratory variability, suggesting right atrial pressure of 8 mmHg.  Assessment and Plan     #) parox AFib AFib initially diagnosed in 20s, historically rare episodes but has increased in burden over the past couple months. Episodes are accompanied with palpitations, occasional SOB.  He takes flecainide  PRN during episodes if they last several hours.  We discussed escalating treatment to daily AAD vs procedural treatment with AF ablation He would need updated ischemic evaluation with coronary CTA prior to considering daily flecainide .  At this time, patient would like to  think about options and will call office if he would like to proceed.  We discussed reducing or eliminating ETOH consumption, and limiting caffeine. Recommended increased physical activity, managing BP and glucose control.   #) Hypercoag d/t afib CHA2DS2-VASc Score = at least 2 [CHF History: 0, HTN History: 1, Diabetes History: 1, Stroke History: 0, Vascular Disease History: 0, Age Score: 0, Gender Score: 0].  Therefore, the patient's annual risk of stroke is 2.2 %.    Stroke ppx - 20mg  xarelto  daily, appropriately dosed No bleeding concerns   #) OSA Continue nightly CPAP use      Current medicines are reviewed at length with the patient today.   The patient does not have concerns regarding his medicines.  The following changes were made today:  none  Labs/ tests ordered today include:  No orders of the defined types were placed in this encounter.    Disposition: Follow up with EP Team or EP APP PRN    Signed, Jaydan Meidinger, NP  11/09/23  10:30 AM  Electrophysiology [facility]

## 2023-11-08 NOTE — Patient Instructions (Signed)

## 2023-11-08 NOTE — Progress Notes (Unsigned)
Patient is doing ok.

## 2023-11-08 NOTE — Progress Notes (Unsigned)
 [facility]  [phone]) [phone] [phone]) [phone]  ID: Collin Holt OB: October 15, 1960  MR#: [id number]  RDW#:[id number]  Patient Care Team: Collin Reyes BIRCH, MD as PCP - General (Internal Medicine) Collin Evalene PARAS, MD as PCP - Cardiology (Cardiology) Collin Ole DASEN, MD as PCP - Electrophysiology (Cardiology) Collin Evalene PARAS, MD as Consulting Physician (Cardiology) Collin Evalene PARAS, MD as Consulting Physician (Oncology)  CHIEF COMPLAINT:  Hereditary hemochromatosis   INTERVAL HISTORY: Patient returns to clinic today for repeat laboratory work, further evaluation, and continuation of phlebotomy.  He continues to feel well and remains asymptomatic.  He has not complained of any weakness or fatigue.  He has no neurologic complaints.  He denies any recent fevers or illnesses. He has a good appetite and denies weight loss.  He has no chest pain, shortness of breath, cough, or hemoptysis.  He denies any nausea, vomiting, constipation, or diarrhea. He has no urinary complaints.  Patient offers no specific complaints today.  REVIEW OF SYSTEMS:   Review of Systems  Constitutional: Negative.  Negative for fever, malaise/fatigue and weight loss.  Respiratory: Negative.  Negative for cough and shortness of breath.   Cardiovascular: Negative.  Negative for chest pain and leg swelling.  Gastrointestinal: Negative.  Negative for abdominal pain, blood in stool and melena.  Genitourinary: Negative.  Negative for dysuria.  Musculoskeletal: Negative.  Negative for back pain and myalgias.  Skin: Negative.  Negative for rash.  Neurological: Negative.  Negative for dizziness, weakness and headaches.  Psychiatric/Behavioral: Negative.  The patient is not nervous/anxious.     As per HPI. Otherwise, a complete review of systems is negative.  PAST MEDICAL HISTORY: Past Medical History:  Diagnosis Date   Allergic rhinitis    Anxiety    B12 deficiency    Benign  paroxysmal vertigo    Chronic headaches    Depression    Diabetes mellitus without complication (HCC)    DOE (dyspnea on exertion)    a. 02/2010 Cath: nl cors;  b. 02/2010 CPX: good fxnl capacity.   Dysrhythmia    Hereditary hemochromatosis    HTN (hypertension)    OSA (obstructive sleep apnea)    PAF (paroxysmal atrial fibrillation) (HCC)    a. 2012 Echo: nl LV fxn, mod dil LA.   Plantar fibromatosis    Pneumonia    Sleep apnea    Tinnitus     PAST SURGICAL HISTORY: Past Surgical History:  Procedure Laterality Date   CARDIOVERSION N/A 05/01/2021   Procedure: CARDIOVERSION;  Surgeon: Collin Evalene PARAS, MD;  Location: [facility] ORS;  Service: Cardiovascular;  Laterality: N/A;   CARDIOVERSION N/A 10/03/2023   Procedure: CARDIOVERSION;  Surgeon: Darliss Rogue, MD;  Location: [facility] ORS;  Service: Cardiovascular;  Laterality: N/A;   COLONOSCOPY WITH PROPOFOL  N/A 07/23/2020   Procedure: COLONOSCOPY WITH PROPOFOL ;  Surgeon: Toledo, Ladell POUR, MD;  Location: [facility] ENDOSCOPY;  Service: Gastroenterology;  Laterality: N/A;   SHOULDER SURGERY     Right x 2    FAMILY HISTORY Family History  Problem Relation Age of Onset   Emphysema Father    Cancer Father    Prostate cancer Neg Hx    Colon cancer Neg Hx    Coronary artery disease Neg Hx    Diabetes Neg Hx        ADVANCED DIRECTIVES:    HEALTH MAINTENANCE: Social History   Tobacco Use   Smoking status: Former    Current packs/day: 0.00    Average packs/day: 1 pack/day  for 10.0 years (10.0 ttl pk-yrs)    Types: Cigarettes    Start date: 01/18/1989    Quit date: 01/19/1999    Years since quitting: 24.8   Smokeless tobacco: Former    Quit date: 01/19/2011  Vaping Use   Vaping status: Never Used  Substance Use Topics   Alcohol use: Yes    Alcohol/week: 4.0 standard drinks of alcohol    Types: 4 Glasses of wine per week    Comment: daily   Drug use: No     Allergies  Allergen Reactions   Morphine And Codeine Nausea And Vomiting    Terfenadine Other (See Comments)    Irregular heartbeat Other reaction(s): GI Upset (intolerance)    Current Outpatient Medications  Medication Sig Dispense Refill   cyanocobalamin  (,VITAMIN B-12,) 1000 MCG/ML injection Inject 1,000 mcg into the muscle every 14 (fourteen) days.     flecainide  (TAMBOCOR ) 100 MG tablet Take 1 tablet (100 mg total) by mouth 2 (two) times daily as needed. 180 tablet 3   ipratropium (ATROVENT ) 0.06 % nasal spray Place 2 sprays into both nostrils 4 (four) times daily. 15 mL 0   losartan (COZAAR) 50 MG tablet Take 50 mg by mouth daily.     meclizine  (ANTIVERT ) 25 MG tablet Take 1 tablet (25 mg total) by mouth 3 (three) times daily as needed for dizziness. 30 tablet 0   metFORMIN (GLUCOPHAGE) 500 MG tablet Take 500 mg by mouth 2 (two) times daily with a meal.     metoprolol  succinate (TOPROL  XL) 25 MG 24 hr tablet Take 1 tablet (25 mg) by mouth once daily. Take with or immediately following a meal. 90 tablet 3   ondansetron (ZOFRAN-ODT) 4 MG disintegrating tablet Take 4 mg by mouth every 8 (eight) hours as needed for nausea or vomiting.     SUMAtriptan (IMITREX) 100 MG tablet Take 100 mg by mouth every 2 (two) hours as needed for migraine.     tadalafil (CIALIS) 20 MG tablet Take 20 mg by mouth daily as needed for erectile dysfunction.     venlafaxine XR (EFFEXOR-XR) 75 MG 24 hr capsule Take 75 mg by mouth daily with breakfast.     XARELTO  20 MG TABS tablet TAKE ONE TABLET BY MOUTH ONCE A DAY WITH SUPPER 90 tablet 0   No current facility-administered medications for this visit.    OBJECTIVE: There were no vitals filed for this visit.    There is no height or weight on file to calculate BMI.    ECOG FS:0 - Asymptomatic  General: Well-developed, well-nourished, no acute distress. Eyes: Pink conjunctiva, anicteric sclera. HEENT: Normocephalic, moist mucous membranes. Lungs: No audible wheezing or coughing. Heart: Regular rate and rhythm. Abdomen: Soft,  nontender, no obvious distention. Musculoskeletal: No edema, cyanosis, or clubbing. Neuro: Alert, answering all questions appropriately. Cranial nerves grossly intact. Skin: No rashes or petechiae noted. Psych: Normal affect.  Back LAB RESULTS:  Lab Results  Component Value Date   NA 132 (L) 12/15/2021   K 3.9 12/15/2021   CL 102 12/15/2021   CO2 23 12/15/2021   GLUCOSE 263 (H) 12/15/2021   BUN 16 12/15/2021   CREATININE 0.86 12/15/2021   CALCIUM 8.7 (L) 12/15/2021   PROT 7.3 12/15/2021   ALBUMIN 3.9 12/15/2021   AST 52 (H) 12/15/2021   ALT 65 (H) 12/15/2021   ALKPHOS 43 12/15/2021   BILITOT 1.1 12/15/2021   GFRNONAA >60 12/15/2021   GFRAA 98 02/28/2019    Lab Results  Component Value Date   WBC 5.7 10/06/2023   NEUTROABS 3.2 10/06/2023   HGB 13.5 10/06/2023   HCT 39.8 10/06/2023   MCV 99.5 10/06/2023   PLT 159 10/06/2023   Lab Results  Component Value Date   FERRITIN 413 (H) 10/06/2023     STUDIES: No results found.  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  PLAN:    Hereditary hemochromatosis: Patient's hemoglobin and iron stores continue to be within normal limits.  Patient's ferritin remains chronically elevated at 381.  Goal ferritin is 50-100.  Proceed with 500 mL phlebotomy today.  Patient has agreed to increase the frequency of his phlebotomy, therefore will return to clinic in 1, 2, and 3 months for laboratory work and phlebotomy only.  Patient within return to clinic in 4 months with repeat laboratory work, further evaluation, and continuation of treatment if needed.   Atrial fibrillation: Continue Xarelto  as ordered.  Follow-up with cardiology as indicated. Hypertension: Patient's blood pressure is within normal limits today.  Continue monitoring and treatment per primary care.  Patient expressed understanding that he can call or return to clinic at any time if he has any questions, concerns, or  complaints.  Evalene JINNY Reusing, MD   11/08/2023 2:28 PM

## 2023-11-09 ENCOUNTER — Encounter: Payer: Self-pay | Admitting: Cardiology

## 2023-11-09 ENCOUNTER — Ambulatory Visit: Attending: Cardiology | Admitting: Cardiology

## 2023-11-09 ENCOUNTER — Encounter: Payer: Self-pay | Admitting: Oncology

## 2023-11-09 VITALS — BP 122/68 | HR 67 | Ht 72.0 in | Wt 284.6 lb

## 2023-11-09 DIAGNOSIS — F1090 Alcohol use, unspecified, uncomplicated: Secondary | ICD-10-CM

## 2023-11-09 DIAGNOSIS — Z789 Other specified health status: Secondary | ICD-10-CM

## 2023-11-09 DIAGNOSIS — I480 Paroxysmal atrial fibrillation: Secondary | ICD-10-CM

## 2023-11-09 DIAGNOSIS — G4733 Obstructive sleep apnea (adult) (pediatric): Secondary | ICD-10-CM

## 2023-11-09 NOTE — Patient Instructions (Signed)
   Follow-Up: At [facility], you and your health needs are our priority.  As part of our continuing mission to provide you with exceptional heart care, our providers are all part of one team.  This team includes your primary Cardiologist (physician) and Advanced Practice Providers or APPs (Physician Assistants and Nurse Practitioners) who all work together to provide you with the care you need, when you need it.  Your next appointment:   TBD, please call    We recommend signing up for the patient portal called MyChart.  Sign up information is provided on this After Visit Summary.  MyChart is used to connect with patients for Virtual Visits (Telemedicine).  Patients are able to view lab/test results, encounter notes, upcoming appointments, etc.  Non-urgent messages can be sent to your provider as well.   To learn more about what you can do with MyChart, go to [email or web address].   Other Instructions  We discussed medications and procedures to prevent atrial fibrillation  Medication - flecainide . This would require a coronary CT scan first before starting  Procedure - Atrial Fibrillation ablation.   Steps you can take to reduce Afib -  Reduce alcohol Increase physical activity Reduce caffeine  Control high blood pressure and diabetes

## 2023-11-25 ENCOUNTER — Other Ambulatory Visit: Payer: Self-pay | Admitting: Cardiovascular Disease

## 2023-11-28 DIAGNOSIS — M25552 Pain in left hip: Secondary | ICD-10-CM | POA: Diagnosis not present

## 2023-11-28 DIAGNOSIS — M79602 Pain in left arm: Secondary | ICD-10-CM | POA: Diagnosis not present

## 2023-12-07 DIAGNOSIS — M7712 Lateral epicondylitis, left elbow: Secondary | ICD-10-CM | POA: Diagnosis not present

## 2023-12-07 DIAGNOSIS — M778 Other enthesopathies, not elsewhere classified: Secondary | ICD-10-CM | POA: Diagnosis not present

## 2023-12-07 DIAGNOSIS — M25522 Pain in left elbow: Secondary | ICD-10-CM | POA: Diagnosis not present

## 2023-12-09 ENCOUNTER — Inpatient Hospital Stay

## 2023-12-09 ENCOUNTER — Inpatient Hospital Stay: Attending: Oncology

## 2024-01-06 ENCOUNTER — Other Ambulatory Visit: Payer: Self-pay | Admitting: *Deleted

## 2024-01-09 ENCOUNTER — Inpatient Hospital Stay

## 2024-01-09 ENCOUNTER — Inpatient Hospital Stay: Attending: Oncology

## 2024-01-13 ENCOUNTER — Ambulatory Visit

## 2024-01-13 ENCOUNTER — Other Ambulatory Visit
Admission: RE | Admit: 2024-01-13 | Discharge: 2024-01-13 | Disposition: A | Discharge status: Home / Self Care | Source: Ambulatory Visit | Attending: Cardiology | Admitting: Cardiology

## 2024-01-13 ENCOUNTER — Telehealth: Payer: Self-pay | Admitting: Cardiovascular Disease

## 2024-01-13 VITALS — BP 108/76 | HR 111 | Ht 72.0 in | Wt 284.0 lb

## 2024-01-13 DIAGNOSIS — I4891 Unspecified atrial fibrillation: Secondary | ICD-10-CM | POA: Diagnosis present

## 2024-01-13 DIAGNOSIS — Z79899 Other long term (current) drug therapy: Secondary | ICD-10-CM | POA: Diagnosis present

## 2024-01-13 DIAGNOSIS — R002 Palpitations: Secondary | ICD-10-CM

## 2024-01-13 DIAGNOSIS — R42 Dizziness and giddiness: Secondary | ICD-10-CM | POA: Diagnosis not present

## 2024-01-13 DIAGNOSIS — I4819 Other persistent atrial fibrillation: Secondary | ICD-10-CM | POA: Diagnosis present

## 2024-01-13 DIAGNOSIS — I479 Paroxysmal tachycardia, unspecified: Secondary | ICD-10-CM | POA: Diagnosis present

## 2024-01-13 LAB — BASIC METABOLIC PANEL WITH GFR
Anion gap: 11 (ref 5–15)
BUN: 18 mg/dL (ref 8–23)
CO2: 25 mmol/L (ref 22–32)
Calcium: 9.4 mg/dL (ref 8.9–10.3)
Chloride: 102 mmol/L (ref 98–111)
Creatinine, Ser: 1.08 mg/dL (ref 0.61–1.24)
GFR, Estimated: >60 mL/min
Glucose, Bld: 152 mg/dL — ABNORMAL HIGH (ref 70–99)
Potassium: 4.2 mmol/L (ref 3.5–5.1)
Sodium: 138 mmol/L (ref 135–145)

## 2024-01-13 LAB — CBC
HCT: 46.6 % (ref 39.0–52.0)
Hemoglobin: 16.3 g/dL (ref 13.0–17.0)
MCH: 33.5 pg (ref 26.0–34.0)
MCHC: 35 g/dL (ref 30.0–36.0)
MCV: 95.9 fL (ref 80.0–100.0)
Platelets: 193 K/uL (ref 150–400)
RBC: 4.86 MIL/uL (ref 4.22–5.81)
RDW: 11.6 % (ref 11.5–15.5)
WBC: 8.2 K/uL (ref 4.0–10.5)
nRBC: 0 % (ref 0.0–0.2)

## 2024-01-13 MED ORDER — METOPROLOL SUCCINATE ER 50 MG PO TB24: ORAL_TABLET | ORAL | 2 refills | Status: AC

## 2024-01-13 NOTE — Progress Notes (Signed)
" °  Cardiology Office Note   Date:  01/13/2024  ID:  Collin Holt, DOB 03-29-1960, MRN [id number] PCP: Auston Reyes BIRCH, MD  [facility] Providers Cardiologist:  Evalene Lunger, MD Cardiology APP:  Loistine Sober, NP  Electrophysiology APP:  Riddle, Suzann, NP   History of Present Illness Collin Holt is a 63 y.o. male PMH paroxysmal atrial fibrillation, HTN, DM 2 who presents for further evaluation and management of paroxysmal tachycardia.  Patient presents today for a DOD visit.  Last saw Suzann Riddle 11/09/2023.  Reports feeling like he has been in A-fib for several days along with some dizziness.  Prescribed metoprolol  and as needed flecainide .  He says that PVI ablation was discussed at a prior visit, but he deferred.  He says after this recurrence, he would like to reconsider.  Relevant CVD History -DCCV 10/03/2023 -TTE 04/2021 LVEF 55 to 60%, normal RV size and function, no significant valvular disease -DCCV 05/01/2021   ROS: Pt denies any chest discomfort, jaw pain, arm pain, palpitations, syncope, presyncope, orthopnea, PND, or LE edema.  Studies Reviewed I have independently reviewed the patient's ECG, previous cardiac testing, recent medical record.  Physical Exam VS:  BP 108/76 (BP Location: Left Arm, Patient Position: Sitting, Cuff Size: Large)   Pulse (!) 111   Ht 6' (1.829 m)   Wt 284 lb (128.8 kg)   SpO2 96%   BMI 38.52 kg/m        Wt Readings from Last 3 Encounters:  01/13/24 284 lb (128.8 kg)  11/09/23 284 lb 9.6 oz (129.1 kg)  11/08/23 285 lb (129.3 kg)    GEN: No acute distress. NECK: No JVD; No carotid bruits. CARDIAC: Tachycardic, irregular rate and rhythm, no murmurs, rubs, gallops. RESPIRATORY:  Clear to auscultation. EXTREMITIES:  Warm and well-perfused. No edema.  ASSESSMENT AND PLAN Paroxysmal tachycardia Palpitation Dizziness Persistent atrial fibrillation Patient presents with recurrence of symptomatic atrial fibrillation.   He has been on Xarelto  without any missed doses in recent memory.  PVI ablation was previously discussed, though he deferred at that time.  After this recurrence, he now states that he would like to re-engage EP regarding catheter ablation.  In the meantime, we will plan for cardioversion.  Plan: - Plan for DCCV next week; no missed doses of Xarelto  in recent memory per patient - Increase metoprolol  succinate to 50 mg daily given HR of 116 today - We will plan to have him see EP in the coming weeks to discuss pursuing catheter ablation and/or AADs given multiple recurrences of symptomatic atrial fibrillation     Informed Consent   The risks (stroke, cardiac arrhythmias rarely resulting in the need for a temporary or permanent pacemaker, skin irritation or burns and complications associated with conscious sedation including aspiration, arrhythmia, respiratory failure and death), benefits (restoration of normal sinus rhythm) and alternatives of a direct current cardioversion were explained in detail to Collin Holt and he agrees to proceed.       Dispo: Follow-up with EP in the next few weeks to discuss catheter ablation and/or other rhythm control strategies  Signed, Caron Poser, MD  "

## 2024-01-13 NOTE — Telephone Encounter (Signed)
 Patient c/o Palpitations:  STAT if patient reporting lightheadedness, shortness of breath, or chest pain  How long have you had palpitations/irregular HR/ Afib? Are you having the symptoms now?   Yes  Are you currently experiencing lightheadedness, SOB or CP?   Dizziness, SOB (started a couple of days ago)  Do you have a history of afib (atrial fibrillation) or irregular heart rhythm?   Yes  Have you checked your BP or HR? (document readings if available):   No  Are you experiencing any other symptoms?   Fatigue at the end of the day  Patient stated he has been in Afib for the last 5 days and wants advice on next steps.

## 2024-01-13 NOTE — Telephone Encounter (Signed)
 Called and spoke with the patient.  Patient states being in a-fib for the last 5 days or so and developing some lightheadedness today that is not resolving.  Patient states taking all medications as prescribed.  Patient denies any chest pain, or shortness of breath.  Patient offered an office visit with Dr. Argentina at 3:20 pm today, and patient accepted.  Patient advised of protocols to go to the emergency room, or call 911 and patient verbalized understanding. All other questions and concerns addressed at this time.

## 2024-01-13 NOTE — Patient Instructions (Signed)
 Medication Instructions:  Your physician recommends the following medication changes.   INCREASE: Metoprolol  Succinate (TOPROL  XL) to 50 mg daily  *If you need a refill on your cardiac medications before your next appointment, please call your pharmacy*  Lab Work: No labs ordered today  If you have labs (blood work) drawn today and your tests are completely normal, you will receive your results only by: MyChart Message (if you have MyChart) OR A paper copy in the mail If you have any lab test that is abnormal or we need to change your treatment, we will call you to review the results.  Testing/Procedures:     Dear Collin Holt  You are scheduled for a Cardioversion on Monday, December 29 with Dr. Timothy Gollan.  Please arrive at the [facility] Entrance of [facility], [address], [place] [address] at 12:00 PM (This is 1 hour(s) prior to your procedure time).  Proceed to the Check-In Desk directly inside the entrance.  Procedure Parking: Use the entrance off of the [address] side of the hospital. Turn right upon entering and follow the driveway to parking that is directly in front of the [facility]. There is no valet parking available at this entrance, however there is an awning directly in front of the [facility] for drop off/ pick up for patients.   DIET:  Nothing to eat or drink after midnight except a sip of water with medications (see medication instructions below)  MEDICATION INSTRUCTIONS: !!IF ANY NEW MEDICATIONS ARE STARTED AFTER TODAY, PLEASE NOTIFY YOUR PROVIDER AS SOON AS POSSIBLE!!  FYI: Medications such as Semaglutide (Ozempic, Wegovy), Tirzepatide (Mounjaro, Zepbound), Dulaglutide (Trulicity), etc (GLP1 agonists) AND Canagliflozin (Invokana), Dapagliflozin (Farxiga), Empagliflozin (Jardiance), Ertugliflozin (Steglatro), Bexagliflozin [employer]) or any combination with one of these drugs such as Invokamet  (Canagliflozin/Metformin), Synjardy (Empagliflozin/Metformin), etc (SGLT2 inhibitors) must be held around the time of a procedure. This is not a comprehensive list of all of these drugs. Please review all of your medications and talk to your provider if you take any one of these. If you are not sure, ask your provider.   HOLD YOUR METFORMIN MORNING OF THE PROCEDURE.  RESUME ONCE YOU ARE ABLE TO EAT A MEAL  Continue taking your anticoagulant (blood thinner): Rivaroxaban  (Xarelto ).   PLEASE TAKE YOUR XERALTO MORNING OF PROCEDURE You will need to continue this after your procedure until you are told by your provider that it is safe to stop.    LABS:  CBC and BMET drawn on 12/26   FYI:  For your safety, and to allow us  to monitor your vital signs accurately during the surgery/procedure we request: If you have artificial nails, gel coating, SNS etc, please have those removed prior to your surgery/procedure. Not having the nail coverings /polish removed may result in cancellation or delay of your surgery/procedure.  Your support person will be asked to wait in the waiting room during your procedure.  It is OK to have someone drop you off and come back when you are ready to be discharged.  You cannot drive after the procedure and will need someone to drive you home.  Bring your insurance cards.  *Special Note: Every effort is made to have your procedure done on time. Occasionally there are emergencies that occur at the hospital that may cause delays. Please be patient if a delay does occur.      Follow-Up: At [facility], you and your health needs  are our priority.  As part of our continuing mission to provide you with exceptional heart care, our providers are all part of one team.  This team includes your primary Cardiologist (physician) and Advanced Practice Providers or APPs (Physician Assistants and Nurse Practitioners) who all work together to provide you with the care you need, when  you need it.  Your next appointment:   2 week(s)  Provider:   Suzann Riddle, NP    We recommend signing up for the patient portal called MyChart.  Sign up information is provided on this After Visit Summary.  MyChart is used to connect with patients for Virtual Visits (Telemedicine).  Patients are able to view lab/test results, encounter notes, upcoming appointments, etc.  Non-urgent messages can be sent to your provider as well.   To learn more about what you can do with MyChart, go to [email or web address].

## 2024-01-15 ENCOUNTER — Ambulatory Visit: Payer: Self-pay

## 2024-01-16 ENCOUNTER — Telehealth: Payer: Self-pay | Admitting: Cardiovascular Disease

## 2024-01-16 ENCOUNTER — Encounter: Admission: RE | Payer: Self-pay | Source: Home / Self Care

## 2024-01-16 ENCOUNTER — Ambulatory Visit: Attending: Cardiovascular Disease

## 2024-01-16 ENCOUNTER — Ambulatory Visit: Admission: RE | Admit: 2024-01-16 | Source: Home / Self Care | Admitting: Cardiovascular Disease

## 2024-01-16 VITALS — BP 128/72 | HR 71

## 2024-01-16 DIAGNOSIS — I480 Paroxysmal atrial fibrillation: Secondary | ICD-10-CM | POA: Diagnosis not present

## 2024-01-16 SURGERY — CARDIOVERSION
Anesthesia: General

## 2024-01-16 NOTE — Progress Notes (Unsigned)
" ° °  Nurse Visit   Date of Encounter: 01/16/2024 ID: Collin Holt Munster, DOB [birth year], MRN [id number]  PCP:  Argentina Clap, MD   [facility] Providers Cardiologist:  Evalene Lunger, MD Cardiology APP:  Loistine Sober, NP  Electrophysiology APP:  Riddle, Suzann, NP { Click to update primary MD,subspecialty MD or APP then REFRESH:1}     Visit Details   VS:  There were no vitals taken for this visit. , BMI There is no height or weight on file to calculate BMI.  Wt Readings from Last 3 Encounters:  01/13/24 284 lb (128.8 kg)  11/09/23 284 lb 9.6 oz (129.1 kg)  11/08/23 285 lb (129.3 kg)     Reason for visit: EKG Performed today: BP, HR, EKG Changes (medications, testing, etc.): none Length of Visit: 30 minutes  The patient came in today for an EKG after canceling his cardioversion scheduled for today. EKG completed to assess rhythm. Per DOD, no changes at this time.   Medications Adjustments/Labs and Tests Ordered: Orders Placed This Encounter  Procedures   EKG 12-Lead   No orders of the defined types were placed in this encounter.    Signed, Madalyn Silvan, RN  01/16/2024 1:30 PM  "

## 2024-01-16 NOTE — Telephone Encounter (Signed)
 PT called to cancel Cardioversion procedure for 01/16/24, PT stated his heart is back in rhythm and he no longer needs the procedure

## 2024-01-26 NOTE — Progress Notes (Unsigned)
 "     Electrophysiology Clinic Note    Date:  01/26/2024  Patient ID:  [name], [name] 16-Jun-1960, MRN [id number] PCP:  Argentina Clap, MD  Cardiologist:  Evalene Lunger, MD  Cardiology APP:  Loistine Sober, NP  Electrophysiology APP:  Camya Haydon, NP     Discussed the use of AI scribe software for clinical note transcription with the patient, who gave verbal consent to proceed.   Patient Profile    Chief Complaint: AFib  History of Present Illness: Collin Holt is a 64 y.o. male with PMH notable for parox AFib, HTN, OSA, hereditary hemochromatosis, T2DM, ETOH use, anxiety, depression; seen today for electrophysiology follow-up of AFib.   I last saw patient 10/2023 for initial EP evaluation of atrial fibrillation.  His A-fib was initially diagnosed at age 31 with very rare episodes historically but with increasing burden starting about 04/2023.  During our visit he indicated that his atrial fibrillation episodes appeared to correlate with increased alcohol consumption.  He was taking as needed flecainide  for episodes lasting a few hours in duration, prescribed by Dr. Gollan.  During our visit he wanted to think more about escalating A-fib treatment either through AAD medication or ablation and the plan was to follow-up as needed.  He was seen by Dr. Argentina 12/2023 for a DOD visit after being in atrial fibrillation for several days.  They plan for repeat cardioversion, but patient self converted.   On follow-up today, *** AF burden, symptoms *** palpitations *** bleeding concerns  *** ETOH consumption? *** CPAP usage?      Arrhythmia/Device History Flecainide  - PRN (rx'd by Lunger)    ROS:  Please see the history of present illness. All other systems are reviewed and otherwise negative.    Physical Exam    VS:  There were no vitals taken for this visit. BMI: There is no height or weight on file to calculate BMI.           Wt Readings from Last 3  Encounters:  01/13/24 284 lb (128.8 kg)  11/09/23 284 lb 9.6 oz (129.1 kg)  11/08/23 285 lb (129.3 kg)     GEN- The patient is well appearing, alert and oriented x 3 today.   Lungs- Clear to ausculation bilaterally, normal work of breathing.  Heart- Regular rate and rhythm, no murmurs, rubs or gallops Extremities- No peripheral edema, warm, dry   Studies Reviewed   Previous EP, cardiology notes.    EKG is ordered. Personal review of EKG from today shows:        TTE, 04/22/2021  1. Left ventricular ejection fraction, by estimation, is 55 to 60%. The left ventricle has normal function. The left ventricle has no regional wall motion abnormalities. There is mild left ventricular hypertrophy. Left ventricular diastolic parameters are indeterminate.   2. Right ventricular systolic function is normal. The right ventricular size is normal.   3. The mitral valve is normal in structure. Trivial mitral valve regurgitation.   4. The aortic valve is tricuspid. Aortic valve regurgitation is mild.   5. The inferior vena cava is normal in size with <50% respiratory variability, suggesting right atrial pressure of 8 mmHg.    Assessment and Plan     #) parox AFib AFib initially diagnosed in 20s, historically rare episodes but has increased in burden over the past couple months. Episodes are accompanied with palpitations, occasional SOB.  He takes flecainide  PRN during episodes if they last several hours.  We  discussed escalating treatment to daily AAD vs procedural treatment with AF ablation He would need updated ischemic evaluation with coronary CTA prior to considering daily flecainide .  At this time, patient would like to think about options and will call office if he would like to proceed.  We discussed reducing or eliminating ETOH consumption, and limiting caffeine. Recommended increased physical activity, managing BP and glucose control.   #) Hypercoag d/t afib CHA2DS2-VASc Score = at least  2 [CHF History: 0, HTN History: 1, Diabetes History: 1, Stroke History: 0, Vascular Disease History: 0, Age Score: 0, Gender Score: 0].  Therefore, the patient's annual risk of stroke is 2.2 %.    Stroke ppx - 20mg  xarelto  daily, appropriately dosed No bleeding concerns   #) OSA Continue nightly CPAP use      Current medicines are reviewed at length with the patient today.   The patient does not have concerns regarding his medicines.  The following changes were made today:  none  Labs/ tests ordered today include:  No orders of the defined types were placed in this encounter.    Disposition: Follow up with EP Team or EP APP PRN    Signed, Tilmon Wisehart, NP  01/26/2024  8:11 PM  Electrophysiology [facility] "

## 2024-01-27 ENCOUNTER — Ambulatory Visit: Attending: Cardiology | Admitting: Cardiology

## 2024-02-15 ENCOUNTER — Other Ambulatory Visit: Payer: Self-pay

## 2024-02-16 ENCOUNTER — Inpatient Hospital Stay: Admitting: Oncology

## 2024-02-16 ENCOUNTER — Inpatient Hospital Stay

## 2024-02-16 ENCOUNTER — Inpatient Hospital Stay: Attending: Oncology

## 2024-02-18 ENCOUNTER — Other Ambulatory Visit: Payer: Self-pay | Admitting: Cardiovascular Disease

## 2024-02-18 DIAGNOSIS — I480 Paroxysmal atrial fibrillation: Secondary | ICD-10-CM

## 2024-02-18 DIAGNOSIS — Z7901 Long term (current) use of anticoagulants: Secondary | ICD-10-CM
